# Patient Record
Sex: Female | Born: 1975
Health system: Southern US, Community
[De-identification: ages and names within clinical notes are randomized; demographics above are authoritative.]

## PROBLEM LIST (undated history)

## (undated) DIAGNOSIS — I10 Essential (primary) hypertension: Secondary | ICD-10-CM

## (undated) DIAGNOSIS — E669 Obesity, unspecified: Secondary | ICD-10-CM

## (undated) DIAGNOSIS — F32A Depression, unspecified: Secondary | ICD-10-CM

## (undated) DIAGNOSIS — F329 Major depressive disorder, single episode, unspecified: Secondary | ICD-10-CM

## (undated) DIAGNOSIS — Z6841 Body Mass Index (BMI) 40.0 and over, adult: Secondary | ICD-10-CM

## (undated) DIAGNOSIS — G4730 Sleep apnea, unspecified: Secondary | ICD-10-CM

---

## 2014-05-22 ENCOUNTER — Emergency Department (HOSPITAL_COMMUNITY)
Admission: EM | Admit: 2014-05-22 | Discharge: 2014-05-22 | Discharge status: Against Medical Advice | Payer: Self-pay | Attending: Emergency Medicine | Admitting: Emergency Medicine

## 2014-05-22 ENCOUNTER — Encounter (HOSPITAL_COMMUNITY): Payer: Self-pay | Admitting: *Deleted

## 2014-05-22 DIAGNOSIS — Z5320 Procedure and treatment not carried out because of patient's decision for unspecified reasons: Secondary | ICD-10-CM

## 2014-05-22 DIAGNOSIS — E669 Obesity, unspecified: Secondary | ICD-10-CM | POA: Insufficient documentation

## 2014-05-22 DIAGNOSIS — Z720 Tobacco use: Secondary | ICD-10-CM | POA: Insufficient documentation

## 2014-05-22 DIAGNOSIS — R109 Unspecified abdominal pain: Secondary | ICD-10-CM | POA: Insufficient documentation

## 2014-05-22 DIAGNOSIS — R110 Nausea: Secondary | ICD-10-CM | POA: Insufficient documentation

## 2014-05-22 DIAGNOSIS — Z9119 Patient's noncompliance with other medical treatment and regimen: Secondary | ICD-10-CM

## 2014-05-22 HISTORY — DX: Depression, unspecified: F32.A

## 2014-05-22 HISTORY — DX: Major depressive disorder, single episode, unspecified: F32.9

## 2014-05-22 HISTORY — DX: Obesity, unspecified: E66.9

## 2014-05-22 LAB — CBC WITH DIFFERENTIAL/PLATELET
BASOS ABS: 0 10*3/uL (ref 0.0–0.1)
Basophils Relative: 0 % (ref 0–1)
Eosinophils Absolute: 0.2 10*3/uL (ref 0.0–0.7)
Eosinophils Relative: 2 % (ref 0–5)
HCT: 37.2 % (ref 36.0–46.0)
HEMOGLOBIN: 11.8 g/dL — AB (ref 12.0–15.0)
LYMPHS PCT: 28 % (ref 12–46)
Lymphs Abs: 2.5 10*3/uL (ref 0.7–4.0)
MCH: 23 pg — AB (ref 26.0–34.0)
MCHC: 31.7 g/dL (ref 30.0–36.0)
MCV: 72.5 fL — AB (ref 78.0–100.0)
Monocytes Absolute: 0.4 10*3/uL (ref 0.1–1.0)
Monocytes Relative: 4 % (ref 3–12)
NEUTROS ABS: 6 10*3/uL (ref 1.7–7.7)
Neutrophils Relative %: 66 % (ref 43–77)
Platelets: 297 10*3/uL (ref 150–400)
RBC: 5.13 MIL/uL — ABNORMAL HIGH (ref 3.87–5.11)
RDW: 17.8 % — ABNORMAL HIGH (ref 11.5–15.5)
WBC: 9 10*3/uL (ref 4.0–10.5)

## 2014-05-22 LAB — COMPREHENSIVE METABOLIC PANEL
ALT: 13 U/L (ref 0–35)
AST: 17 U/L (ref 0–37)
Albumin: 3.1 g/dL — ABNORMAL LOW (ref 3.5–5.2)
Alkaline Phosphatase: 74 U/L (ref 39–117)
Anion gap: 7 (ref 5–15)
BUN: 12 mg/dL (ref 6–23)
CO2: 27 mmol/L (ref 19–32)
Calcium: 8.9 mg/dL (ref 8.4–10.5)
Chloride: 106 mmol/L (ref 96–112)
Creatinine, Ser: 0.79 mg/dL (ref 0.50–1.10)
GFR calc Af Amer: >90 mL/min (ref >90)
GFR calc non Af Amer: >90 mL/min (ref >90)
Glucose, Bld: 154 mg/dL — ABNORMAL HIGH (ref 70–99)
Potassium: 3.4 mmol/L — ABNORMAL LOW (ref 3.5–5.1)
Sodium: 140 mmol/L (ref 135–145)
Total Bilirubin: 0.3 mg/dL (ref 0.3–1.2)
Total Protein: 6.9 g/dL (ref 6.0–8.3)

## 2014-05-22 LAB — LIPASE, BLOOD: LIPASE: 30 U/L (ref 11–59)

## 2014-05-22 NOTE — ED Notes (Addendum)
Pt reports not feeling well x 2 days with abd pain and nausea. Pain is to right side of abd. No acute distress noted at triage.

## 2014-05-22 NOTE — ED Notes (Signed)
Pt did not answer when called x3 

## 2014-05-22 NOTE — ED Notes (Signed)
No answer x2 

## 2014-05-22 NOTE — ED Notes (Signed)
No answer from pt in waiting room 

## 2014-05-25 DIAGNOSIS — F250 Schizoaffective disorder, bipolar type: Secondary | ICD-10-CM | POA: Insufficient documentation

## 2014-05-25 DIAGNOSIS — F259 Schizoaffective disorder, unspecified: Secondary | ICD-10-CM | POA: Insufficient documentation

## 2014-05-26 NOTE — ED Provider Notes (Signed)
Pt left before being seen after triage  1. Left before treatment completed      Toy CookeyMegan Docherty, MD 05/26/14 1152

## 2014-06-06 ENCOUNTER — Ambulatory Visit (INDEPENDENT_AMBULATORY_CARE_PROVIDER_SITE_OTHER): Payer: Self-pay | Admitting: Psychiatry

## 2014-06-06 ENCOUNTER — Encounter (HOSPITAL_COMMUNITY): Payer: Self-pay | Admitting: Psychiatry

## 2014-06-06 VITALS — BP 146/104 | HR 82 | Ht 65.0 in | Wt 334.0 lb

## 2014-06-06 DIAGNOSIS — F200 Paranoid schizophrenia: Secondary | ICD-10-CM

## 2014-06-06 MED ORDER — THIOTHIXENE 10 MG PO CAPS: 10.0000 mg | ORAL_CAPSULE | Freq: Every day | ORAL | Status: DC

## 2014-06-06 MED ORDER — BENZTROPINE MESYLATE 0.5 MG PO TABS: 0.5000 mg | ORAL_TABLET | Freq: Every day | ORAL | Status: DC
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
## 2014-06-28 ENCOUNTER — Ambulatory Visit (HOSPITAL_COMMUNITY): Payer: Self-pay | Admitting: Psychiatry

## 2014-07-04 ENCOUNTER — Ambulatory Visit (HOSPITAL_COMMUNITY): Payer: Self-pay | Admitting: Psychiatry

## 2014-07-04 ENCOUNTER — Ambulatory Visit (HOSPITAL_COMMUNITY): Payer: Self-pay | Admitting: Clinical

## 2014-07-28 ENCOUNTER — Encounter (HOSPITAL_COMMUNITY): Payer: Self-pay | Admitting: Psychiatry

## 2014-07-28 ENCOUNTER — Ambulatory Visit (INDEPENDENT_AMBULATORY_CARE_PROVIDER_SITE_OTHER): Payer: 59 | Admitting: Psychiatry

## 2014-07-28 VITALS — BP 123/88 | HR 89 | Ht 65.5 in | Wt 333.2 lb

## 2014-07-28 DIAGNOSIS — F200 Paranoid schizophrenia: Secondary | ICD-10-CM | POA: Diagnosis not present

## 2014-07-28 MED ORDER — THIOTHIXENE 10 MG PO CAPS: 10.0000 mg | ORAL_CAPSULE | Freq: Every day | ORAL | Status: DC

## 2014-07-28 MED ORDER — BENZTROPINE MESYLATE 0.5 MG PO TABS: 0.5000 mg | ORAL_TABLET | Freq: Every day | ORAL | Status: DC
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
## 2014-08-01 ENCOUNTER — Ambulatory Visit (HOSPITAL_COMMUNITY): Payer: Self-pay | Admitting: Clinical

## 2014-08-29 ENCOUNTER — Encounter (HOSPITAL_COMMUNITY): Payer: Self-pay | Admitting: Psychiatry

## 2014-08-29 ENCOUNTER — Ambulatory Visit (INDEPENDENT_AMBULATORY_CARE_PROVIDER_SITE_OTHER): Payer: 59 | Admitting: Psychiatry

## 2014-08-29 VITALS — BP 160/98 | HR 88 | Ht 65.5 in | Wt 335.0 lb

## 2014-08-29 DIAGNOSIS — F200 Paranoid schizophrenia: Secondary | ICD-10-CM | POA: Diagnosis not present

## 2014-08-29 MED ORDER — THIOTHIXENE 10 MG PO CAPS: 10.0000 mg | ORAL_CAPSULE | Freq: Every day | ORAL | Status: DC

## 2014-08-29 MED ORDER — BENZTROPINE MESYLATE 0.5 MG PO TABS: 0.5000 mg | ORAL_TABLET | Freq: Every day | ORAL | Status: DC
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
## 2014-11-29 ENCOUNTER — Ambulatory Visit (HOSPITAL_COMMUNITY): Payer: Self-pay | Admitting: Psychiatry

## 2015-06-07 ENCOUNTER — Telehealth (HOSPITAL_COMMUNITY): Payer: Self-pay

## 2015-06-07 NOTE — Telephone Encounter (Signed)
Telephone call with patient after she had left a message requesting Dr. Lolly Mustache provide her with refills of Navane and Cogentin but has not been seen since 08/29/14.  Patient reported she has not been able to come in due to not having any insurance.  States she has just taken a job and will have insurance in 60 days.  Informed it was doubtful Dr. Lolly Mustache would provide her with new orders since it had been so long since her last evaluation.  Discussed options patient could try temporarily to since she presently has no insurance and would also need assistance for medications.  Patient stated plan to contact [facility] for a walk-in evaluation and will call back if problems getting scheduled.

## 2015-07-07 ENCOUNTER — Emergency Department (INDEPENDENT_AMBULATORY_CARE_PROVIDER_SITE_OTHER)
Admission: EM | Admit: 2015-07-07 | Discharge: 2015-07-07 | Disposition: A | Discharge status: Home / Self Care | Payer: Self-pay | Source: Home / Self Care | Attending: Emergency Medicine | Admitting: Emergency Medicine

## 2015-07-07 ENCOUNTER — Encounter (HOSPITAL_COMMUNITY): Payer: Self-pay

## 2015-07-07 ENCOUNTER — Other Ambulatory Visit (HOSPITAL_COMMUNITY): Payer: Self-pay | Admitting: Psychiatry

## 2015-07-07 DIAGNOSIS — Z760 Encounter for issue of repeat prescription: Secondary | ICD-10-CM

## 2015-07-07 MED ORDER — THIOTHIXENE 10 MG PO CAPS: ORAL_CAPSULE | ORAL | Status: DC

## 2015-07-07 MED ORDER — BENZTROPINE MESYLATE 0.5 MG PO TABS: 1.0000 mg | ORAL_TABLET | Freq: Two times a day (BID) | ORAL | Status: DC

## 2015-07-07 NOTE — ED Notes (Addendum)
Patient needs medication refill on Bienztropine (cogentin) 0.5mg  and throthixene (novane) 10 mg  No acute distress

## 2015-07-07 NOTE — ED Provider Notes (Signed)
CSN: [id number]     Arrival date & time 07/07/15  1841 History   First MD Initiated Contact with Patient 07/07/15 1943     Chief Complaint  Patient presents with  . Medication Refill   (Consider location/radiation/quality/duration/timing/severity/associated sxs/prior Treatment) HPI History obtained from patient:  Out of medication for 2 weeks.  Does not have a PCP No insurance No suicidal/or homicidal  Past Medical History  Diagnosis Date  . Obesity   . Depression    Past Surgical History  Procedure Laterality Date  . Cesarean section     Family History  Problem Relation Age of Onset  . Schizophrenia Mother    Social History  Substance Use Topics  . Smoking status: Current Every Day Smoker    Types: Cigarettes  . Smokeless tobacco: Never Used  . Alcohol Use: 0.0 oz/week    0 Standard drinks or equivalent per week     Comment: occasional   OB History    No data available     Review of Systems Out of medication Allergies  Penicillins  Home Medications   Prior to Admission medications   Medication Sig Start Date End Date Taking? Authorizing Provider  benztropine (COGENTIN) 0.5 MG tablet Take 2 tablets (1 mg total) by mouth 2 (two) times daily. 07/07/15   Tharon AquasFrank C Tyree Vandruff, PA  thiothixene (NAVANE) 10 MG capsule 1 PO qhs 07/07/15   Tharon AquasFrank C Cashlyn Huguley, PA   Meds Ordered and Administered this Visit  Medications - No data to display  BP 132/97 mmHg  Pulse 86  Temp(Src) 98.6 F (37 C) (Oral)  SpO2 97%  LMP 06/15/2015 (Approximate) No data found.   Physical Exam NURSES NOTES AND VITAL SIGNS REVIEWED. CONSTITUTIONAL: Well developed, well nourished, no acute distress HEENT: normocephalic, atraumatic, right and left TM's are normal EYES: Conjunctiva normal NECK:normal ROM, supple, no adenopathy PULMONARY:No respiratory distress, normal effort, Lungs: CTAb/l, no wheezes, or increased work of breathing CARDIOVASCULAR: RRR, no murmur ABDOMEN: soft, ND, NT, +'ve  BS MUSCULOSKELETAL: Normal ROM of all extremities,  SKIN: warm and dry without rash PSYCHIATRIC: Mood and affect, behavior are normal  ED Course  Procedures (including critical care time)  Labs Review Labs Reviewed - No data to display  Imaging Review No results found.   Visual Acuity Review  Right Eye Distance:   Left Eye Distance:   Bilateral Distance:    Right Eye Near:   Left Eye Near:    Bilateral Near:        RX navane cogentin MDM   1. Medication refill      Patient is reassured that there are no issues that require transfer to higher level of care at this time or additional tests. Patient is advised to continue home symptomatic treatment. Patient is advised that if there are new or worsening symptoms to attend the emergency department, contact primary care provider, or return to UC. Instructions of care provided discharged home in stable condition. Return to work/school note provided.   THIS NOTE WAS GENERATED USING A VOICE RECOGNITION SOFTWARE PROGRAM. ALL REASONABLE EFFORTS  WERE MADE TO PROOFREAD THIS DOCUMENT FOR ACCURACY.  I have verbally reviewed the discharge instructions with the patient. A printed AVS was given to the patient.  All questions were answered prior to discharge.      Tharon AquasFrank C Kelda Azad, PA 07/07/15 2035

## 2015-07-07 NOTE — Discharge Instructions (Signed)
Medicine Refill at the Emergency Department  We have refilled your medicine today, but it is best for you to get refills through your primary health care provider's office. In the future, please plan ahead so you do not need to get refills from the emergency department.  If the medicine we refilled was a maintenance medicine, you may have received only enough to get you by until you are able to see your regular health care provider.     This information is not intended to replace advice given to you by your health care provider. Make sure you discuss any questions you have with your health care provider.     Document Released: 08/02/2003 Document Revised: 05/06/2014 Document Reviewed: 07/23/2013  Elsevier Interactive Patient Education 2016 Elsevier Inc.

## 2015-08-20 ENCOUNTER — Emergency Department (HOSPITAL_COMMUNITY)
Admission: EM | Admit: 2015-08-20 | Discharge: 2015-08-20 | Disposition: A | Discharge status: Home / Self Care | Payer: Self-pay | Attending: Emergency Medicine | Admitting: Emergency Medicine

## 2015-08-20 ENCOUNTER — Encounter (HOSPITAL_COMMUNITY): Payer: Self-pay | Admitting: Emergency Medicine

## 2015-08-20 DIAGNOSIS — Z760 Encounter for issue of repeat prescription: Secondary | ICD-10-CM | POA: Insufficient documentation

## 2015-08-20 DIAGNOSIS — R51 Headache: Secondary | ICD-10-CM | POA: Insufficient documentation

## 2015-08-20 DIAGNOSIS — Z8659 Personal history of other mental and behavioral disorders: Secondary | ICD-10-CM | POA: Insufficient documentation

## 2015-08-20 DIAGNOSIS — R519 Headache, unspecified: Secondary | ICD-10-CM

## 2015-08-20 DIAGNOSIS — Z9119 Patient's noncompliance with other medical treatment and regimen: Secondary | ICD-10-CM | POA: Insufficient documentation

## 2015-08-20 DIAGNOSIS — F17210 Nicotine dependence, cigarettes, uncomplicated: Secondary | ICD-10-CM | POA: Insufficient documentation

## 2015-08-20 DIAGNOSIS — E669 Obesity, unspecified: Secondary | ICD-10-CM | POA: Insufficient documentation

## 2015-08-20 DIAGNOSIS — Z880 Allergy status to penicillin: Secondary | ICD-10-CM | POA: Insufficient documentation

## 2015-08-20 MED ORDER — THIOTHIXENE 10 MG PO CAPS: 10.0000 mg | ORAL_CAPSULE | Freq: Every day | ORAL | Status: DC

## 2015-08-20 MED ORDER — BENZTROPINE MESYLATE 1 MG PO TABS: 1.0000 mg | ORAL_TABLET | Freq: Two times a day (BID) | ORAL | Status: DC

## 2015-08-20 MED ORDER — BENZTROPINE MESYLATE 1 MG PO TABS
1.0000 mg | ORAL_TABLET | Freq: Once | ORAL | Status: AC
  Administered 2015-08-20: 1 mg via ORAL
  Filled 2015-08-20: qty 1

## 2015-08-20 MED ORDER — ACETAMINOPHEN 500 MG PO TABS
1000.0000 mg | ORAL_TABLET | Freq: Once | ORAL | Status: AC
  Administered 2015-08-20: 1000 mg via ORAL
  Filled 2015-08-20: qty 2

## 2015-08-20 NOTE — ED Notes (Signed)
Pt. reports headache for 2 days , denies head injury , no nausea or photophobia . No fever or chills.

## 2015-08-20 NOTE — ED Provider Notes (Signed)
CSN: [id number]     Arrival date & time 08/20/15  0047 History   First MD Initiated Contact with Patient 08/20/15 [phone]     Chief Complaint  Patient presents with  . Headache     (Consider location/radiation/quality/duration/timing/severity/associated sxs/prior Treatment) HPI Comments: 40 year old female with a history of paranoid schizophrenia who presents with headache. She reports a gradual onset of headache that is moderate in intensity. She has had similar headaches previously when she is not taking her medications. She reports being out of her to home medications, Cogentin and thiothixene, for the past 3-4 days. She does not currently have a PCP due to active insurance but is acquiring insurance soon through her new job. She states that this headache is like past headaches she has had related to medication noncompliance. No visual changes, fevers, neck stiffness, vomiting, abdominal pain, or recent illness. No rashes. She wants to get a medication refill as she is also concerned about developing hallucinations, which has not happened yet.  Patient is a 40 y.o. female presenting with headaches. The history is provided by the patient.  Headache   Past Medical History  Diagnosis Date  . Obesity   . Depression    Past Surgical History  Procedure Laterality Date  . Cesarean section     Family History  Problem Relation Age of Onset  . Schizophrenia Mother    Social History  Substance Use Topics  . Smoking status: Current Every Day Smoker    Types: Cigarettes  . Smokeless tobacco: Never Used  . Alcohol Use: 0.0 oz/week    0 Standard drinks or equivalent per week     Comment: occasional   OB History    No data available     Review of Systems  Neurological: Positive for headaches.   10 Systems reviewed and are negative for acute change except as noted in the HPI.   Allergies  Penicillins  Home Medications   Prior to Admission medications   Medication Sig Start Date  End Date Taking? Authorizing Provider  benztropine (COGENTIN) 1 MG tablet Take 1 tablet (1 mg total) by mouth 2 (two) times daily. 08/20/15   Laurence Spates, MD  thiothixene (NAVANE) 10 MG capsule Take 1 capsule (10 mg total) by mouth at bedtime. 08/20/15   Ambrose Finland Xiomara Sevillano, MD   BP 143/92 mmHg  Pulse 98  Temp(Src) 98.1 F (36.7 C) (Oral)  Resp 24  Ht  (1.676 m)  Wt 334 lb 3 oz (151.586 kg)  BMI 53.96 kg/m2  SpO2 97% Physical Exam  Constitutional: She is oriented to person, place, and time. She appears well-developed and well-nourished. No distress.  HENT:  Head: Normocephalic and atraumatic.  Moist mucous membranes  Eyes: Conjunctivae are normal. Pupils are equal, round, and reactive to light.  Neck: Normal range of motion. Neck supple.  Cardiovascular: Normal rate, regular rhythm and normal heart sounds.   No murmur heard. Pulmonary/Chest: Effort normal and breath sounds normal.  Abdominal: Soft. Bowel sounds are normal. She exhibits no distension. There is no tenderness.  Musculoskeletal: She exhibits no edema.  Neurological: She is alert and oriented to person, place, and time. No cranial nerve deficit. She exhibits normal muscle tone.  Fluent speech, 5/5 strength and normal sensation x all 4 ext  Skin: Skin is warm and dry.  Psychiatric: She has a normal mood and affect. Judgment normal.  Calm, avoids eye contact  Nursing note and vitals reviewed.   ED Course  Procedures (including  critical care time) Labs Review Labs Reviewed - No data to display   Medications  acetaminophen (TYLENOL) tablet 1,000 mg (not administered)  benztropine (COGENTIN) tablet 1 mg (not administered)     MDM   Final diagnoses:  Acute nonintractable headache, unspecified headache type  Medication refill    Patient presents with 2 days of headache which she has had previously with medication noncompliance; patient out of her medication for 3-4 days. On exam she was awake and  alert, comfortable. Normal neurologic exam. Vital signs notable for mild hypertension. She had not taken any medications at home for her headache. She specifically requested refills. Gave the patient Tylenol as well as her dose of Cogentin. Because of patient's desire to remain compliant with her psychiatric medications, I did provide her with a short refill of both of her medications to allow her time to schedule an appointment with a PCP. I emphasized the importance of establishing care and provided with outpatient resources for clinics. I reviewed return precautions regarding her headache including fever, vomiting, visual changes, or other neurologic symptoms. Patient voiced understanding and was discharged in satisfactory condition.  Laurence Spatesachel Morgan Johndavid Geralds, MD 08/20/15 [phone]

## 2015-08-20 NOTE — Discharge Instructions (Signed)
General Headache Without Cause A headache is pain or discomfort felt around the head or neck area. The specific cause of a headache may not be found. There are many causes and types of headaches. A few common ones are:  Tension headaches.  Migraine headaches.  Cluster headaches.  Chronic daily headaches. HOME CARE INSTRUCTIONS  Watch your condition for any changes. Take these steps to help with your condition: Managing Pain  Take over-the-counter and prescription medicines only as told by your health care provider.  Lie down in a dark, quiet room when you have a headache.  If directed, apply ice to the head and neck area:  Put ice in a plastic bag.  Place a towel between your skin and the bag.  Leave the ice on for 20 minutes, 2-3 times per day.  Use a heating pad or hot shower to apply heat to the head and neck area as told by your health care provider.  Keep lights dim if bright lights bother you or make your headaches worse. Eating and Drinking  Eat meals on a regular schedule.  Limit alcohol use.  Decrease the amount of caffeine you drink, or stop drinking caffeine. General Instructions  Keep all follow-up visits as told by your health care provider. This is important.  Keep a headache journal to help find out what may trigger your headaches. For example, write down:  What you eat and drink.  How much sleep you get.  Any change to your diet or medicines.  Try massage or other relaxation techniques.  Limit stress.  Sit up straight, and do not tense your muscles.  Do not use tobacco products, including cigarettes, chewing tobacco, or e-cigarettes. If you need help quitting, ask your health care provider.  Exercise regularly as told by your health care provider.  Sleep on a regular schedule. Get 7-9 hours of sleep, or the amount recommended by your health care provider. SEEK MEDICAL CARE IF:   Your symptoms are not helped by medicine.  You have a  headache that is different from the usual headache.  You have nausea or you vomit.  You have a fever. SEEK IMMEDIATE MEDICAL CARE IF:   Your headache becomes severe.  You have repeated vomiting.  You have a stiff neck.  You have a loss of vision.  You have problems with speech.  You have pain in the eye or ear.  You have muscular weakness or loss of muscle control.  You lose your balance or have trouble walking.  You feel faint or pass out.  You have confusion.   This information is not intended to replace advice given to you by your health care provider. Make sure you discuss any questions you have with your health care provider.   Document Released: 04/15/2005 Document Revised: 01/04/2015 Document Reviewed: 08/08/2014 Elsevier Interactive Patient Education 2016 [employer].  [employer] Assistance The United Ways 211 is a great source of information about community services available.  Access by dialing 2-1-1 from anywhere in [place], or by website -  PooledIncome.pl.   Other Local Resources (Updated 04/2015)  Financial Assistance   Services    Phone Number and Address  [facility]  Low-cost medical care - 1st and 3rd Saturday of every month  Must not qualify for public or private insurance and must have limited income [phone] [address] [place], [place]    [employer] of Social Services  Child care  Emergency assistance  for housing and [employer]  Medicaid [phone] [address] [place], [place] [address]   [facility] Department  Low-cost medical care for children, communicable diseases, sexually-transmitted diseases, immunizations, maternity care, womens health and family planning [phone] [address] [place], [place] [address]  [facility]   Medication assistance for  [facility] residents  Must meet income requirements [phone] [address] [place], [place].    [facility] Social Services  Child care  Emergency assistance for housing and [employer]  Medicaid [id number] [address] [place], [place] [address]  [facility]   Low-cost medical care,   Monday through Friday, 9 am to 6 pm.   Accepts Medicare/Medicaid, and self-pay [phone] [address] [place], [place] [address]  [facility]  Low-cost medical care - Monday through Friday, 8:30 am - 5:30 pm  Accepts Medicaid and self-pay [phone] [address] [place], [place] [address]   [facility]  Primary medical care, including for those with sickle cell disease  Accepts Medicare, Medicaid, insurance and self-pay [phone] [address] [place], [place]  [facility]   Primary medical care  Accepts Medicare, [place], insurance and self-pay [phone] [address] [place], [place] [address]   [facility] Department of Social Services  Child care  Emergency assistance for housing and [employer]  Medicaid [phone] [address] [place], [place] [address]  [facility] Department of Health and [employer]  Child care  Emergency assistance for housing and [employer]  Medicaid [phone] [address] [place], [place] [address]   [facility] Medication Assistance Program  Medication assistance for [facility] residents with no insurance only  Must have a primary care doctor [phone] [place], [place]  [facility]   Primary medical care  [place], [place], insurance  [phone] [address] [place], [place]  [facility]   Medication assistance [phone]  [facility]   Primary medical care  Accepts Medicare, [place], insurance and self-pay [phone] [address] [place], [place] [address]  [facility]   Primary medical care  Accepts Medicare, [place], insurance and self-pay [phone] [address] [place], [place] [address]  [facility]  For [place] residents between the ages of 22 and 80 who do not have any form of health insurance, Medicare, [place], or [place] benefits.  Services are provided free of charge to uninsured patients who fall within federal poverty guidelines.    Hours: Tuesdays and Thursdays, 4:15 - 8 pm [phone] [address] [place], [place] [address]  [facility]     Primary medical care  Dental care  Nutritional counseling  Pharmacy  Accepts Medicaid, Medicare, most insurance.  Fees are adjusted based on ability to pay.   [phone] [facility] [address] [place], [place]  [phone] [facility] [address] [place], [place]  [phone] [facility] [place], [place]  [phone] [facility], [address] [place], [place]  [phone] [facility] [address] [place], [place]  [facility]  Womens health and family planning [phone] [place], [place]  [facility] Department of Social Services  Child care  Emergency assistance  for housing and [employer]ark[employer]ilities  Food stamps  Medicaid [phone] [address], [place], [place] [id number]   [facility]    Ages 918 and older  Hours: Mondays and Thursdays, 7:00 am - 9:00 am Patients are seen on a first come, first served basis. [phone], ext. [phone] [place], [place]  [facility] of Social Services  Child care  Emergency assistance for housing and [employer]  Food  stamps  Medicaid [phone] [address] [place], [place] [id number]  [facility] [employer] [facility]  Medication assistance  Rental assistance  Food pantry  Medication assistance  Housing assistance  Emergency food distribution  Utility assistance [phone] [address] [place], [place]  [phone]  [address] [place], [place] [id number] Hours: Tuesdays and Thursdays from 9am - 12 noon by appointment only  [phone] [address] [place], [place] [id number]  [facility] - Lanae Boastlara F. Gunn   Accepts private insurance, [place], and [place].  Payment is based on a sliding scale for those without insurance.  Hours: Mondays, Tuesdays and Thursdays, 8:30 am - 5:30 pm.   [phone] [address] [place], [place]  [facility] - [facility] Medicine at [facility]    Accepts private insurance, [place], and [place].  Payment is based on a sliding scale for those without insurance. [phone] [address] [place], [place]  [facility] - Pediatrics at [address]. Scientist, research (physical sciences)[place]  Accepts private insurance, [employer]icare, and [place]icaid.  Payment is based on a sliding scale for those without insurance [phone] [address], [place], [place]  [facility] - [facility]s at [place]  Accepts private insurance, [place]icare, and [place].  Payment is based on a sliding scale for those without insurance. [phone] [address] [place], [place]  [facility] - [facility]s at [facility]  Accepts private insurance, [place]are, and [place]icaid.  Payment is based on a sliding scale for those without insurance. [phone], ext. [phone] [place], [place].    [facility]  Maternity care.  Accepts Medicaid and self-pay. [phone] [address] [place], [place]

## 2017-06-05 ENCOUNTER — Other Ambulatory Visit: Payer: Self-pay

## 2017-06-05 ENCOUNTER — Emergency Department (HOSPITAL_BASED_OUTPATIENT_CLINIC_OR_DEPARTMENT_OTHER): Payer: Self-pay

## 2017-06-05 ENCOUNTER — Encounter (HOSPITAL_BASED_OUTPATIENT_CLINIC_OR_DEPARTMENT_OTHER): Payer: Self-pay | Admitting: Emergency Medicine

## 2017-06-05 ENCOUNTER — Emergency Department (HOSPITAL_BASED_OUTPATIENT_CLINIC_OR_DEPARTMENT_OTHER)
Admission: EM | Admit: 2017-06-05 | Discharge: 2017-06-05 | Disposition: A | Discharge status: Home / Self Care | Payer: Self-pay | Attending: Emergency Medicine | Admitting: Emergency Medicine

## 2017-06-05 DIAGNOSIS — B349 Viral infection, unspecified: Secondary | ICD-10-CM | POA: Insufficient documentation

## 2017-06-05 DIAGNOSIS — R0600 Dyspnea, unspecified: Secondary | ICD-10-CM | POA: Insufficient documentation

## 2017-06-05 DIAGNOSIS — J069 Acute upper respiratory infection, unspecified: Secondary | ICD-10-CM | POA: Insufficient documentation

## 2017-06-05 DIAGNOSIS — I10 Essential (primary) hypertension: Secondary | ICD-10-CM | POA: Insufficient documentation

## 2017-06-05 DIAGNOSIS — B9789 Other viral agents as the cause of diseases classified elsewhere: Secondary | ICD-10-CM

## 2017-06-05 DIAGNOSIS — F17210 Nicotine dependence, cigarettes, uncomplicated: Secondary | ICD-10-CM | POA: Insufficient documentation

## 2017-06-05 HISTORY — DX: Essential (primary) hypertension: I10

## 2017-06-05 MED ORDER — AZITHROMYCIN 250 MG PO TABS: 250.0000 mg | ORAL_TABLET | Freq: Every day | ORAL | 0 refills | Status: DC

## 2017-06-05 MED ORDER — PREDNISONE 10 MG PO TABS: 30.0000 mg | ORAL_TABLET | Freq: Every day | ORAL | 0 refills | Status: AC

## 2017-06-05 MED ORDER — ALBUTEROL SULFATE HFA 108 (90 BASE) MCG/ACT IN AERS
2.0000 | INHALATION_SPRAY | Freq: Once | RESPIRATORY_TRACT | Status: AC
  Administered 2017-06-05: 2 via RESPIRATORY_TRACT
  Filled 2017-06-05 (×2): qty 6.7

## 2017-06-05 MED ORDER — ALBUTEROL SULFATE (2.5 MG/3ML) 0.083% IN NEBU
2.5000 mg | INHALATION_SOLUTION | Freq: Once | RESPIRATORY_TRACT | Status: AC
  Administered 2017-06-05: 2.5 mg via RESPIRATORY_TRACT
  Filled 2017-06-05: qty 3

## 2017-06-05 MED FILL — AZITHROMYCIN 250 MG TABLET: 250 | 5 days supply | Qty: 6 | Fill #0

## 2017-06-05 MED FILL — predniSONE 10 MG TABS: 10 | 5 days supply | Qty: 15 | Fill #0

## 2017-06-05 NOTE — ED Provider Notes (Signed)
Emergency Department Provider Note   I have reviewed the triage vital signs and the nursing notes.   HISTORY  Chief Complaint Cough   HPI Breanna Moore is a 42 y.o. female with PMH of HTN, tobacco use, and elevated BMI is to the emergency department with 1 week of dyspnea on exertion with slightly productive cough and fatigue.  Patient states initially she had a lot of nasal congestion was treating with over-the-counter medications.  She went to urgent care 5 days ago and was told she did not have pneumonia.  She was discharged home with an albuterol inhaler along with Tessalon Perles.  She is continue taking over-the-counter medications but states her shortness of breath and cough are not improved.  She denies fevers but has been very fatigued.  No body aches.  No headaches.  No nausea or diarrhea.  She will occasionally have episodes of posttussive emesis.  Denies hemoptysis.  Has cut back to smoking half pack a day.   Today, she was found to be very SOB by a co-worker who called EMS. She was given a breathing treatment on scene but then opted to transport herself to the ED.    Past Medical History:  Diagnosis Date  . Depression   . Depression   . Hypertension   . Obesity     Patient Active Problem List   Diagnosis Date Noted  . Schizo-affective psychosis (HCC) 05/25/2014    Past Surgical History:  Procedure Laterality Date  . CESAREAN SECTION      Current Outpatient Rx  . Order #: [id number] Class: Historical Med  . Order #: [id number] Class: Historical Med  . Order #: [id number] Class: Print  . Order #: [id number] Class: Print  . Order #: [id number] Class: Print  . Order #: [id number] Class: Print    Allergies Penicillins  Family History  Problem Relation Age of Onset  . Schizophrenia Mother     Social History Social History   Tobacco Use  . Smoking status: Current Every Day Smoker    Types: Cigarettes  . Smokeless tobacco: Never Used  Substance Use Topics  .  Alcohol use: Yes    Alcohol/week: 0.0 oz    Comment: occasional  . Drug use: No    Review of Systems  Constitutional: No fever/chills. Positive generalized fatigue.  Eyes: No visual changes. ENT: No sore throat. Cardiovascular: Denies chest pain. Respiratory: Positive shortness of breath and productive cough.  Gastrointestinal: No abdominal pain.  No nausea, no vomiting.  No diarrhea.  No constipation. Genitourinary: Negative for dysuria. Musculoskeletal: Negative for back pain. Skin: Negative for rash. Neurological: Negative for headaches, focal weakness or numbness.  10-point ROS otherwise negative.  ____________________________________________   PHYSICAL EXAM:  VITAL SIGNS: ED Triage Vitals  Enc Vitals Group     BP --      Pulse Rate 06/05/17 0931 (!) 103     Resp 06/05/17 0931 (!) 26     Temp 06/05/17 0931 98.1 F (36.7 C)     Temp Source 06/05/17 0931 Oral     SpO2 06/05/17 0931 96 %     Weight 06/05/17 0933 (!) 388 lb (176 kg)     Height 06/05/17 0933 5\' 6"  (1.676 m)    Constitutional: Alert and oriented. Well appearing and in no acute distress. Speaking in full sentences and able to provide a complete history.  Eyes: Conjunctivae are normal.  Head: Atraumatic. Nose: No congestion/rhinnorhea. Mouth/Throat: Mucous membranes are moist.  Neck: No stridor.   Cardiovascular: Tachycardia. Good  peripheral circulation. Grossly normal heart sounds.   Respiratory: Slight increased respiratory effort.  No retractions. Lungs with faint bilateral end-expiratory wheezing.  Gastrointestinal: Soft and nontender. No distention.  Musculoskeletal: No lower extremity tenderness nor edema. No gross deformities of extremities. Neurologic:  Normal speech and language. No gross focal neurologic deficits are appreciated.  Skin:  Skin is warm, dry and intact. No rash noted.  ____________________________________________  RADIOLOGY  Dg Chest 2 View  Result Date:  06/05/2017 CLINICAL DATA:  Cough and congestion for 1 week EXAM: CHEST  2 VIEW COMPARISON:  None. FINDINGS: The heart size and mediastinal contours are within normal limits. Both lungs are clear. The visualized skeletal structures are unremarkable. IMPRESSION: No active cardiopulmonary disease. Electronically Signed   By: Alcide Clever M.D.   On: 06/05/2017 10:03    ____________________________________________   PROCEDURES  Procedure(s) performed:   Procedures  None ____________________________________________   INITIAL IMPRESSION / ASSESSMENT AND PLAN / ED COURSE  Pertinent labs & imaging results that were available during my care of the patient were reviewed by me and considered in my medical decision making (see chart for details).  Patient presents to the emergency department for evaluation of exertional dyspnea with productive cough in the setting of URI symptoms.  The patient has no hypoxemia here but does have some faint bilateral end expiratory wheezing.  Lung sounds are distant given patient's body habitus but no focal areas of consolidation are appreciated.  Given continued symptoms plan for repeat chest x-ray and albuterol nebulizer.  Patient is not having chest pain, hemoptysis, or other symptoms that would require additional blood work or imaging.  Plan to reassess after breathing treatment and chest x-ray.   I did spend time counseling the patient regarding need for smoking cessation and that her current symptoms are likely made worse by her continued smoking.   10:45 AM She is feeling improved after albuterol nebulizer. Reviewed CXR with no PNA. Plan for Z-Pak, steroid, albuterol inhaler at discharge.  Provided contact information for primary care physician.  Discussed return precautions.   At this time, I do not feel there is any life-threatening condition present. I have reviewed and discussed all results (EKG, imaging, lab, urine as appropriate), exam findings with  patient. I have reviewed nursing notes and appropriate previous records.  I feel the patient is safe to be discharged home without further emergent workup. Discussed usual and customary return precautions. Patient and family (if present) verbalize understanding and are comfortable with this plan.  Patient will follow-up with their primary care provider. If they do not have a primary care provider, information for follow-up has been provided to them. All questions have been answered.  ____________________________________________  FINAL CLINICAL IMPRESSION(S) / ED DIAGNOSES  Final diagnoses:  Viral URI with cough     MEDICATIONS GIVEN DURING THIS VISIT:  Medications  albuterol (PROVENTIL HFA;VENTOLIN HFA) 108 (90 Base) MCG/ACT inhaler 2 puff (not administered)  albuterol (PROVENTIL) (2.5 MG/3ML) 0.083% nebulizer solution 2.5 mg (2.5 mg Nebulization Given 06/05/17 1018)     NEW OUTPATIENT MEDICATIONS STARTED DURING THIS VISIT:  New Prescriptions   AZITHROMYCIN (ZITHROMAX) 250 MG TABLET    Take 1 tablet (250 mg total) by mouth daily. Take first 2 tablets together, then 1 every day until finished.   PREDNISONE (DELTASONE) 10 MG TABLET    Take 3 tablets (30 mg total) by mouth daily for 5 days.    Note:  This document was prepared using Conservation officer, historic buildings and may  include unintentional dictation errors.  Alona Bene, MD Emergency Medicine    Leeya Rusconi, Arlyss Repress, MD 06/05/17 [phone]

## 2017-06-05 NOTE — Discharge Instructions (Signed)

## 2017-06-05 NOTE — ED Triage Notes (Signed)
Patient was seen and treated on Sunday at the Urgent Care and given several therapies at home. The patient has started an inhaler, and OTC medication. Patient states that she is not any better at this time.

## 2017-06-05 NOTE — ED Notes (Signed)
apteint is falling asleep during d/c the patient states that it is because she has not slept in a few days

## 2017-06-30 ENCOUNTER — Other Ambulatory Visit: Payer: Self-pay

## 2017-06-30 ENCOUNTER — Emergency Department (HOSPITAL_BASED_OUTPATIENT_CLINIC_OR_DEPARTMENT_OTHER): Payer: Medicaid Other

## 2017-06-30 ENCOUNTER — Encounter (HOSPITAL_BASED_OUTPATIENT_CLINIC_OR_DEPARTMENT_OTHER): Payer: Self-pay | Admitting: Emergency Medicine

## 2017-06-30 ENCOUNTER — Emergency Department (HOSPITAL_BASED_OUTPATIENT_CLINIC_OR_DEPARTMENT_OTHER)
Admission: EM | Admit: 2017-06-30 | Discharge: 2017-06-30 | Disposition: A | Discharge status: Home / Self Care | Payer: Medicaid Other | Attending: Emergency Medicine | Admitting: Emergency Medicine

## 2017-06-30 DIAGNOSIS — Z79899 Other long term (current) drug therapy: Secondary | ICD-10-CM | POA: Insufficient documentation

## 2017-06-30 DIAGNOSIS — J189 Pneumonia, unspecified organism: Secondary | ICD-10-CM

## 2017-06-30 DIAGNOSIS — I10 Essential (primary) hypertension: Secondary | ICD-10-CM | POA: Insufficient documentation

## 2017-06-30 DIAGNOSIS — J181 Lobar pneumonia, unspecified organism: Secondary | ICD-10-CM | POA: Insufficient documentation

## 2017-06-30 DIAGNOSIS — F17210 Nicotine dependence, cigarettes, uncomplicated: Secondary | ICD-10-CM | POA: Insufficient documentation

## 2017-06-30 LAB — CBC WITH DIFFERENTIAL/PLATELET
BASOS ABS: 0 10*3/uL (ref 0.0–0.1)
Basophils Relative: 0 %
EOS PCT: 2 %
Eosinophils Absolute: 0.2 10*3/uL (ref 0.0–0.7)
HEMATOCRIT: 35.3 % — AB (ref 36.0–46.0)
Hemoglobin: 10.6 g/dL — ABNORMAL LOW (ref 12.0–15.0)
LYMPHS ABS: 2.5 10*3/uL (ref 0.7–4.0)
LYMPHS PCT: 27 %
MCH: 21.6 pg — ABNORMAL LOW (ref 26.0–34.0)
MCHC: 30 g/dL (ref 30.0–36.0)
MCV: 71.9 fL — AB (ref 78.0–100.0)
MONOS PCT: 4 %
Monocytes Absolute: 0.4 10*3/uL (ref 0.1–1.0)
NEUTROS PCT: 67 %
Neutro Abs: 6.3 10*3/uL (ref 1.7–7.7)
Platelets: 436 10*3/uL — ABNORMAL HIGH (ref 150–400)
RBC: 4.91 MIL/uL (ref 3.87–5.11)
RDW: 21 % — AB (ref 11.5–15.5)
WBC: 9.4 10*3/uL (ref 4.0–10.5)

## 2017-06-30 LAB — BASIC METABOLIC PANEL
ANION GAP: 10 (ref 5–15)
BUN: 10 mg/dL (ref 6–20)
CALCIUM: 8.6 mg/dL — AB (ref 8.9–10.3)
CO2: 26 mmol/L (ref 22–32)
Chloride: 103 mmol/L (ref 101–111)
Creatinine, Ser: 0.82 mg/dL (ref 0.44–1.00)
Glucose, Bld: 111 mg/dL — ABNORMAL HIGH (ref 65–99)
POTASSIUM: 3.6 mmol/L (ref 3.5–5.1)
Sodium: 139 mmol/L (ref 135–145)

## 2017-06-30 LAB — BRAIN NATRIURETIC PEPTIDE: B Natriuretic Peptide: 39.3 pg/mL (ref 0.0–100.0)

## 2017-06-30 LAB — TROPONIN I

## 2017-06-30 MED ORDER — PREDNISONE 50 MG PO TABS
60.0000 mg | ORAL_TABLET | Freq: Once | ORAL | Status: AC
  Administered 2017-06-30: 60 mg via ORAL
  Filled 2017-06-30: qty 1

## 2017-06-30 MED ORDER — DOXYCYCLINE HYCLATE 100 MG PO CAPS: 100.0000 mg | ORAL_CAPSULE | Freq: Two times a day (BID) | ORAL | 0 refills | Status: AC

## 2017-06-30 MED ORDER — DOXYCYCLINE HYCLATE 100 MG PO TABS
100.0000 mg | ORAL_TABLET | Freq: Once | ORAL | Status: AC
  Administered 2017-06-30: 100 mg via ORAL
  Filled 2017-06-30: qty 1

## 2017-06-30 MED ORDER — ALBUTEROL SULFATE HFA 108 (90 BASE) MCG/ACT IN AERS: 1.0000 | INHALATION_SPRAY | Freq: Once | RESPIRATORY_TRACT | Status: DC

## 2017-06-30 MED ORDER — IPRATROPIUM-ALBUTEROL 0.5-2.5 (3) MG/3ML IN SOLN
3.0000 mL | Freq: Four times a day (QID) | RESPIRATORY_TRACT | Status: DC
  Administered 2017-06-30: 3 mL via RESPIRATORY_TRACT
  Filled 2017-06-30: qty 3

## 2017-06-30 MED ORDER — ALBUTEROL SULFATE HFA 108 (90 BASE) MCG/ACT IN AERS: 1.0000 | INHALATION_SPRAY | Freq: Four times a day (QID) | RESPIRATORY_TRACT | 0 refills | Status: DC | PRN

## 2017-06-30 MED FILL — DOXYCYCLINE HYCLATE 100 MG: 100 | 7 days supply | Qty: 14 | Fill #0

## 2017-06-30 MED FILL — PROVENTIL HFA 108 (90 BASE): 108 (90 BAS | 25 days supply | Qty: 7 | Fill #0

## 2017-06-30 NOTE — ED Provider Notes (Signed)
[facility] EMERGENCY DEPARTMENT Provider Note   CSN: [id number] Arrival date & time: 06/30/17  1208     History   Chief Complaint Chief Complaint  Patient presents with  . Shortness of Breath    HPI Breanna Moore is a 42 y.o. female this history of tobacco use is here for evaluation of persistent cough for 2-3 weeks mildly productive with a couple of bloody streaks. Developed shortness of breath described as "feeling winded" with walking from her car to her work and to the bathroom. Feels like her chest is tight. Denies fevers, chills, exertional chest pain, dizziness, palpitations, nausea, vomiting, diaphoresis, lower extremity swelling, orthopnea, calf pain or swelling. States she was in the emergency department 2-3 weeks ago and was diagnosed with a possible early pneumonia and was treated with azithromycin and albuterol. Felt like her symptoms were improving but then over the last couple of days the cough has returned. States she has not been able to smoke cigarettes in the last 2-3 weeks due to the cough. No history of DVT/PE, recent immobilization or surgeries, estrogen use, prolonged travel. Has tried Mucinex and resting which have not helped, taking a long hot shower and standing in the steam and feels like it opens up her airways. No history of asthma or COPD.  HPI  Past Medical History:  Diagnosis Date  . Depression   . Depression   . Hypertension   . Obesity     Patient Active Problem List   Diagnosis Date Noted  . Schizo-affective psychosis (HCC) 05/25/2014    Past Surgical History:  Procedure Laterality Date  . CESAREAN SECTION      OB History    No data available       Home Medications    Prior to Admission medications   Medication Sig Start Date End Date Taking? Authorizing Provider  VALSARTAN PO Take by mouth.   Yes [provider]  albuterol (PROVENTIL HFA;VENTOLIN HFA) 108 (90 Base) MCG/ACT inhaler Inhale 1-2 puffs into the lungs  every 6 (six) hours as needed for wheezing or shortness of breath. 06/30/17   Liberty Handy, PA-C  azithromycin (ZITHROMAX) 250 MG tablet Take 1 tablet (250 mg total) by mouth daily. Take first 2 tablets together, then 1 every day until finished. 06/05/17   Long, Arlyss Repress, MD  benztropine (COGENTIN) 1 MG tablet Take 1 tablet (1 mg total) by mouth 2 (two) times daily. 08/20/15   Little, Ambrose Finland, MD  buPROPion (WELLBUTRIN XL) 150 MG 24 hr tablet Take 150 mg by mouth daily.    [provider]  doxycycline (VIBRAMYCIN) 100 MG capsule Take 1 capsule (100 mg total) by mouth 2 (two) times daily for 7 days. 06/30/17 07/07/17  Liberty Handy, PA-C  Lurasidone HCl (LATUDA) 60 MG TABS Take by mouth.    [provider]  thiothixene (NAVANE) 10 MG capsule Take 1 capsule (10 mg total) by mouth at bedtime. 08/20/15   Little, Ambrose Finland, MD    Family History Family History  Problem Relation Age of Onset  . Schizophrenia Mother     Social History Social History   Tobacco Use  . Smoking status: Current Every Day Smoker    Types: Cigarettes  . Smokeless tobacco: Never Used  Substance Use Topics  . Alcohol use: Yes    Alcohol/week: 0.0 oz    Comment: occasional  . Drug use: No     Allergies   Penicillins   Review of Systems Review  of Systems  Respiratory: Positive for cough, chest tightness and shortness of breath.   All other systems reviewed and are negative.    Physical Exam Updated Vital Signs BP (!) 144/74   Pulse 88   Temp 97.6 F (36.4 C) (Oral)   Resp (!) 23   Ht 5\' 6"  (1.676 m)   Wt (!) 176 kg (388 lb)   SpO2 92%   BMI 62.62 kg/m   Physical Exam  Constitutional: She is oriented to person, place, and time. She appears well-developed and well-nourished. No distress.  HENT:  Head: Normocephalic and atraumatic.  Nose: Nose normal.  Mouth/Throat: Oropharynx is clear and moist. No oropharyngeal exudate.  Eyes: Conjunctivae and EOM are normal.  Pupils are equal, round, and reactive to light.  Neck: Normal range of motion. Neck supple.  Cardiovascular: Normal rate, regular rhythm, normal heart sounds and intact distal pulses.  No murmur heard. 2+ DP and radial pulses bilaterally. No LE edema or calf tenderness.   Pulmonary/Chest: Effort normal. She has wheezes in the right middle field, the right lower field, the left middle field and the left lower field. She exhibits no tenderness.  Ambulated with normal Spo2 with ambulation   Abdominal: Soft. Bowel sounds are normal. She exhibits no distension. There is no tenderness.  Musculoskeletal: Normal range of motion. She exhibits no deformity.  Lymphadenopathy:    She has no cervical adenopathy.  Neurological: She is alert and oriented to person, place, and time. No sensory deficit.  Skin: Skin is warm and dry. Capillary refill takes less than 2 seconds.  Psychiatric: She has a normal mood and affect. Her behavior is normal. Judgment and thought content normal.  Nursing note and vitals reviewed.    ED Treatments / Results  Labs (all labs ordered are listed, but only abnormal results are displayed) Labs Reviewed  CBC WITH DIFFERENTIAL/PLATELET - Abnormal; Notable for the following components:      Result Value   Hemoglobin 10.6 (*)    HCT 35.3 (*)    MCV 71.9 (*)    MCH 21.6 (*)    RDW 21.0 (*)    Platelets 436 (*)    All other components within normal limits  BASIC METABOLIC PANEL - Abnormal; Notable for the following components:   Glucose, Bld 111 (*)    Calcium 8.6 (*)    All other components within normal limits  BRAIN NATRIURETIC PEPTIDE  TROPONIN I    EKG  EKG Interpretation  Date/Time:  Monday June 30 2017 12:20:33 EST Ventricular Rate:  91 PR Interval:    QRS Duration: 86 QT Interval:  371 QTC Calculation: 457 R Axis:   41 Text Interpretation:  Sinus rhythm Borderline prolonged PR interval Low voltage, precordial leads No STEMI.  Confirmed by Alona Bene  [phone]) on 06/30/2017 12:28:15 PM       Radiology Dg Chest 2 View  Result Date: 06/30/2017 CLINICAL DATA:  Patient underwent treatment for mid pneumonia several weeks ago. The patient now complains of exertional shortness of breath. History of obesity, hypertension, current smoker. EXAM: CHEST  2 VIEW COMPARISON:  Chest x-ray of June 05, 2017 FINDINGS: The lungs are adequately inflated. There is increased density in the right lower lobe. There is no pleural effusion or pneumothorax. The heart is top-normal in size. There is prominence of the central pulmonary vascularity. The bony thorax exhibits no acute abnormality. IMPRESSION: Low-grade CHF. Probable right basilar atelectasis or pneumonia. Followup PA and lateral chest X-ray is recommended in  3-4 weeks following trial of antibiotic therapy to ensure resolution and exclude underlying malignancy. Electronically Signed   By: David  Swazil[place]Jordan M.D.   On: 06/30/2017 13:10    Procedures Procedures (including critical care time)  Medications Ordered in ED Medications  ipratropium-albuterol (DUONEB) 0.5-2.5 (3) MG/3ML nebulizer solution 3 mL (3 mLs Nebulization Given 06/30/17 1324)  albuterol (PROVENTIL HFA;VENTOLIN HFA) 108 (90 Base) MCG/ACT inhaler 1-2 puff (not administered)  predniSONE (DELTASONE) tablet 60 mg (60 mg Oral Given 06/30/17 1311)  doxycycline (VIBRA-TABS) tablet 100 mg (100 mg Oral Given 06/30/17 1503)     Initial Impression / Assessment and Plan / ED Course  I have reviewed the triage vital signs and the nursing notes.  Pertinent labs & imaging results that were available during my care of the patient were reviewed by me and considered in my medical decision making (see chart for details).  Clinical Course as of Jun 30 1524  Mon Jun 30, 2017  1417 Hemoglobin: (!) 10.6 [CG]    Clinical Course User Index [CG] Liberty HandyGibbons, Kenzel Ruesch J, PA-C   42 y.o. yo female cough, dyspnea, and sputum production.  On physical examination,VS are within  normal limits with no fever, tachypnea or tachycardia.  Patient is non toxic appearing, speaking in full sentences with no signs of respiratory distress.  Chest examination revealed diffuse wheezing and decreased breath sounds to bilateral lower lobes although exam difficult given body habitus. CXR reveals infiltrate and mild vascular congestion.  Labs obtained and reassuring, mild anemia noted which is not new. BNP normal. She does not appear to be fluid overloaded on exam. She ambulated without hypoxia in ED. CURB-65 qualifies patient is low risk for complications with outpatient antibiotic tx for CAP and that outpatient abx is appropriate.  Will discharge patient with doxycycline and albuterol with adjunctive symptomatic treatment with close PCP f/u.  Patient verbalized understanding and is agreeable to discharge plan.  Strict ED return precautions given, patient is aware of red flag symptoms to monitor for that would warrant return to ED for further evaluation.  Vital signs were within normal limits prior to discharge. She was advised to f/u with cardiology and PCP for CXR finding of low grade CHF/vascular congestion on CXR.   Final Clinical Impressions(s) / ED Diagnoses   Final diagnoses:  Community acquired pneumonia of right lower lobe of lung [facility](HCC)    ED Discharge Orders        Ordered    doxycycline (VIBRAMYCIN) 100 MG capsule  2 times daily     06/30/17 1456    albuterol (PROVENTIL HFA;VENTOLIN HFA) 108 (90 Base) MCG/ACT inhaler  Every 6 hours PRN     06/30/17 1456       Liberty HandyGibbons, Danee Soller J, PA-C 06/30/17 1526    LongArlyss Repress, Joshua G, MD 06/30/17 2003

## 2017-06-30 NOTE — ED Notes (Signed)
Pt ambulated and stated it was a normal walk for her. Spo2 96-99 and heart rate 100-105.

## 2017-06-30 NOTE — Discharge Instructions (Signed)
As we discussed your chest x-ray should right lower lobe pneumonia and some congestion along vasculature around your heart. Take doxycyline as prescribed and albuterol inhaler. Avoid cigarettes. You need a repeat chest x-ray in 3-4 weeks and follow up with cardiology for evaluation of your heart given new chest x-ray findings.   Return for worsening symptoms, shortness of breath and chest pain with exertion or activity.

## 2017-06-30 NOTE — ED Triage Notes (Signed)
Reports treated for pneumonia several weeks ago.  States that she cannot walk more than a few steps without becoming short of breath.

## 2017-07-07 ENCOUNTER — Encounter (HOSPITAL_COMMUNITY): Payer: Self-pay | Admitting: Emergency Medicine

## 2017-07-07 ENCOUNTER — Emergency Department (HOSPITAL_COMMUNITY): Payer: Medicaid Other

## 2017-07-07 ENCOUNTER — Ambulatory Visit: Payer: Self-pay | Admitting: Cardiology

## 2017-07-07 ENCOUNTER — Emergency Department (HOSPITAL_COMMUNITY)
Admission: EM | Admit: 2017-07-07 | Discharge: 2017-07-07 | Disposition: A | Discharge status: Home / Self Care | Payer: Medicaid Other | Attending: Emergency Medicine | Admitting: Emergency Medicine

## 2017-07-07 DIAGNOSIS — Z5321 Procedure and treatment not carried out due to patient leaving prior to being seen by health care provider: Secondary | ICD-10-CM | POA: Insufficient documentation

## 2017-07-07 DIAGNOSIS — R0602 Shortness of breath: Secondary | ICD-10-CM | POA: Insufficient documentation

## 2017-07-07 NOTE — ED Triage Notes (Signed)
Pt reports that she been diagnosed with PNA twice in past 4 weeks. Still on antibiotics but having SOB with exertion.  Adds that she has back pains and will fall a sleep like she did at work today.

## 2017-08-11 ENCOUNTER — Ambulatory Visit: Payer: BLUE CROSS/BLUE SHIELD | Admitting: Cardiology

## 2017-08-11 ENCOUNTER — Encounter: Payer: Self-pay | Admitting: Cardiology

## 2017-08-11 ENCOUNTER — Other Ambulatory Visit: Payer: Self-pay | Admitting: Cardiology

## 2017-08-11 DIAGNOSIS — F17210 Nicotine dependence, cigarettes, uncomplicated: Secondary | ICD-10-CM

## 2017-08-11 DIAGNOSIS — R0602 Shortness of breath: Secondary | ICD-10-CM | POA: Diagnosis not present

## 2017-08-11 DIAGNOSIS — I10 Essential (primary) hypertension: Secondary | ICD-10-CM | POA: Diagnosis not present

## 2017-08-11 NOTE — Patient Instructions (Addendum)
Medication Instructions:  Your physician recommends that you continue on your current medications as directed. Please refer to the Current Medication list given to you today.  Labwork: None  Testing/Procedures: Your physician has requested that you have an echocardiogram. Echocardiography is a painless test that uses sound waves to create images of your heart. It provides your doctor with information about the size and shape of your heart and how well your heart's chambers and valves are working. This procedure takes approximately one hour. There are no restrictions for this procedure.  Your physician has requested that you have a lexiscan myoview. For further information please visit [email or web address]. Please follow instruction sheet, as given.  Follow-Up: Your physician recommends that you schedule a follow-up appointment in: 6 months  Any Other Special Instructions Will Be Listed Below (If Applicable).     If you need a refill on your cardiac medications before your next appointment, please call your pharmacy.   [facility]  Seydina Holliman A, RN, BSN  Echocardiogram An echocardiogram, or echocardiography, uses sound waves (ultrasound) to produce an image of your heart. The echocardiogram is simple, painless, obtained within a short period of time, and offers valuable information to your health care provider. The images from an echocardiogram can provide information such as:  Evidence of coronary artery disease (CAD).  Heart size.  Heart muscle function.  Heart valve function.  Aneurysm detection.  Evidence of a past heart attack.  Fluid buildup around the heart.  Heart muscle thickening.  Assess heart valve function.  Tell a health care provider about:  Any allergies you have.  All medicines you are taking, including vitamins, herbs, eye drops, creams, and over-the-counter medicines.  Any problems you or family members have had with anesthetic medicines.  Any  blood disorders you have.  Any surgeries you have had.  Any medical conditions you have.  Whether you are pregnant or may be pregnant. What happens before the procedure? No special preparation is needed. Eat and drink normally. What happens during the procedure?  In order to produce an image of your heart, gel will be applied to your chest and a wand-like tool (transducer) will be moved over your chest. The gel will help transmit the sound waves from the transducer. The sound waves will harmlessly bounce off your heart to allow the heart images to be captured in real-time motion. These images will then be recorded.  You may need an IV to receive a medicine that improves the quality of the pictures. What happens after the procedure? You may return to your normal schedule including diet, activities, and medicines, unless your health care provider tells you otherwise. This information is not intended to replace advice given to you by your health care provider. Make sure you discuss any questions you have with your health care provider. Document Released: 04/12/2000 Document Revised: 12/02/2015 Document Reviewed: 12/21/2012 Elsevier Interactive Patient Education  2017 [employer]  

## 2017-08-11 NOTE — Progress Notes (Signed)
Cardiology Office Note:    Date:  08/11/2017   ID:  Breanna Moore, DOB Apr 10, 1976, MRN [id number]  PCP:  Patient, No Pcp Per  Cardiologist:  Garwin Brothers, MD   Referring MD: No ref. provider found    ASSESSMENT:    1. Shortness of breath on exertion   2. Essential hypertension   3. Morbid obesity (HCC)   4. Cigarette smoker    PLAN:    In order of problems listed above:  1. Primary prevention stressed to the patient importance of compliance with diet and medications stressed and she vocalized understanding.  Her blood pressure stable.  Diet was discussed for dyslipidemia and obesity and risks of obesity explained and she vocalized understanding and she is going to work on reducing her weight with dietary intervention. 2. Cardiogram will be done to assess murmur heard on auscultation.  The patient will also be scheduled for a Lexiscan sestamibi to assess for any objective evidence of coronary artery disease in view of her shortness of breath on exertion.  She has multiple comorbidities for coronary artery disease 3. Patient will be seen in follow-up appointment in 6 months or earlier if the patient has any concerns 4. She knows to go to the nearest emergency room for any significant concerns.   Medication Adjustments/Labs and Tests Ordered: Current medicines are reviewed at length with the patient today.  Concerns regarding medicines are outlined above.  Orders Placed This Encounter  Procedures  . MYOCARDIAL PERFUSION IMAGING  . ECHOCARDIOGRAM COMPLETE   No orders of the defined types were placed in this encounter.    History of Present Illness:    Breanna Moore is a 42 y.o. female who is being seen today for the evaluation of shortness of breath.  Patient mentions to me that she is new to this area.  She is a resident of another part of the state and has moved here.  She gives history of shortness of breath on exertion.  She is morbidly obese.  She has history of  essential hypertension.  No chest pain.  She tells me that her shortness of breath is a issue affecting her quality of life.  She is an active heavy smoker she quit recently a few days ago.  No orthopnea or PND.  There is history of sleep apnea but I am not clear about the details.  She is trying to get established with a primary care physician.  She denies any history of dyslipidemia.  At the time of my evaluation, the patient is alert awake oriented and in no distress.  Past Medical History:  Diagnosis Date  . Depression   . Depression   . Hypertension   . Obesity     Past Surgical History:  Procedure Laterality Date  . CESAREAN SECTION      Current Medications: Current Meds  Medication Sig  . albuterol (PROVENTIL HFA;VENTOLIN HFA) 108 (90 Base) MCG/ACT inhaler Inhale 1-2 puffs into the lungs every 6 (six) hours as needed for wheezing or shortness of breath.  . lurasidone (LATUDA) 40 MG TABS tablet Take 40 mg by mouth daily.  Marland Kitchen VALSARTAN PO Take by mouth.  . [DISCONTINUED] azithromycin (ZITHROMAX) 250 MG tablet Take 1 tablet (250 mg total) by mouth daily. Take first 2 tablets together, then 1 every day until finished.  . [DISCONTINUED] benztropine (COGENTIN) 1 MG tablet Take 1 tablet (1 mg total) by mouth 2 (two) times daily.  . [DISCONTINUED] buPROPion (WELLBUTRIN XL) 150 MG 24  hr tablet Take 150 mg by mouth daily.  . [DISCONTINUED] Lurasidone HCl (LATUDA) 60 MG TABS Take by mouth.  . [DISCONTINUED] thiothixene (NAVANE) 10 MG capsule Take 1 capsule (10 mg total) by mouth at bedtime.     Allergies:   Hydrocodone-acetaminophen; Ketorolac; Paxil [paroxetine hcl]; and Penicillins   Social History   Socioeconomic History  . Marital status: Single    Spouse name: Not on file  . Number of children: Not on file  . Years of education: Not on file  . Highest education level: Not on file  Occupational History  . Not on file  Social Needs  . Financial resource strain: Not on file  .  Food insecurity:    Worry: Not on file    Inability: Not on file  . Transportation needs:    Medical: Not on file    Non-medical: Not on file  Tobacco Use  . Smoking status: Current Every Day Smoker    Types: Cigarettes  . Smokeless tobacco: Never Used  Substance and Sexual Activity  . Alcohol use: Yes    Alcohol/week: 0.0 oz    Comment: occasional  . Drug use: No  . Sexual activity: Never    Birth control/protection: None  Lifestyle  . Physical activity:    Days per week: Not on file    Minutes per session: Not on file  . Stress: Not on file  Relationships  . Social connections:    Talks on phone: Not on file    Gets together: Not on file    Attends religious service: Not on file    Active member of club or organization: Not on file    Attends meetings of clubs or organizations: Not on file    Relationship status: Not on file  Other Topics Concern  . Not on file  Social History Narrative  . Not on file     Family History: The patient's family history includes Schizophrenia in her mother.  ROS:   Please see the history of present illness.    All other systems reviewed and are negative.  EKGs/Labs/Other Studies Reviewed:    The following studies were reviewed today: I discussed my findings from the emergency room with the patient at length.  EKG reveals sinus rhythm with nonspecific ST-T changes and first-degree AV block.   Recent Labs: 06/30/2017: B Natriuretic Peptide 39.3; BUN 10; Creatinine, Ser 0.82; Hemoglobin 10.6; Platelets 436; Potassium 3.6; Sodium 139  Recent Lipid Panel No results found for: CHOL, TRIG, HDL, CHOLHDL, VLDL, LDLCALC, LDLDIRECT  Physical Exam:    VS:  BP 132/80 (BP Location: Left Arm, Patient Position: Sitting, Cuff Size: Normal)   Pulse 99   Ht 5\' 6"  (1.676 m)   Wt (!) 395 lb 1.9 oz (179.2 kg)   SpO2 97%   BMI 63.77 kg/m     Wt Readings from Last 3 Encounters:  08/11/17 (!) 395 lb 1.9 oz (179.2 kg)  06/30/17 (!) 388 lb (176 kg)   06/05/17 (!) 388 lb (176 kg)     GEN: Patient is in no acute distress HEENT: Normal NECK: No JVD; No carotid bruits LYMPHATICS: No lymphadenopathy CARDIAC: S1 S2 regular, 2/6 systolic murmur at the apex. RESPIRATORY:  Clear to auscultation without rales, wheezing or rhonchi  ABDOMEN: Soft, non-tender, non-distended MUSCULOSKELETAL:  No edema; No deformity  SKIN: Warm and dry NEUROLOGIC:  Alert and oriented x 3 PSYCHIATRIC:  Normal affect    Signed, Garwin Brothersajan R Becka Lagasse, MD  08/11/2017 10:40  AM    [facility]

## 2017-08-21 ENCOUNTER — Telehealth (HOSPITAL_COMMUNITY): Payer: Self-pay | Admitting: *Deleted

## 2017-08-21 NOTE — Telephone Encounter (Signed)
Left message on voicemail per DPR in reference to upcoming appointment scheduled on 08/25/17 with detailed instructions given per Myocardial Perfusion Study Information Sheet for the test. LM to arrive 15 minutes early, and that it is imperative to arrive on time for appointment to keep from having the test rescheduled. If you need to cancel or reschedule your appointment, please call the office within 24 hours of your appointment. Failure to do so may result in a cancellation of your appointment, and a $50 no show fee. Phone number given for call back for any questions. Ronnie Doo Jacqueline    

## 2017-08-25 ENCOUNTER — Ambulatory Visit (HOSPITAL_COMMUNITY): Payer: BLUE CROSS/BLUE SHIELD | Attending: Internal Medicine

## 2017-08-25 VITALS — Ht 66.0 in | Wt 395.0 lb

## 2017-08-25 DIAGNOSIS — I2510 Atherosclerotic heart disease of native coronary artery without angina pectoris: Secondary | ICD-10-CM | POA: Diagnosis not present

## 2017-08-25 DIAGNOSIS — I119 Hypertensive heart disease without heart failure: Secondary | ICD-10-CM | POA: Insufficient documentation

## 2017-08-25 DIAGNOSIS — Z6841 Body Mass Index (BMI) 40.0 and over, adult: Secondary | ICD-10-CM | POA: Insufficient documentation

## 2017-08-25 DIAGNOSIS — F17200 Nicotine dependence, unspecified, uncomplicated: Secondary | ICD-10-CM | POA: Insufficient documentation

## 2017-08-25 DIAGNOSIS — R0602 Shortness of breath: Secondary | ICD-10-CM | POA: Diagnosis not present

## 2017-08-25 MED ORDER — TECHNETIUM TC 99M TETROFOSMIN IV KIT
33.0000 | PACK | Freq: Once | INTRAVENOUS | Status: AC | PRN
  Administered 2017-08-25: 33 via INTRAVENOUS
  Filled 2017-08-25: qty 33

## 2017-08-25 MED ORDER — REGADENOSON 0.4 MG/5ML IV SOLN
0.4000 mg | Freq: Once | INTRAVENOUS | Status: AC
  Administered 2017-08-25: 0.4 mg via INTRAVENOUS

## 2017-08-26 ENCOUNTER — Ambulatory Visit (HOSPITAL_COMMUNITY): Payer: BLUE CROSS/BLUE SHIELD

## 2017-08-26 ENCOUNTER — Ambulatory Visit (HOSPITAL_BASED_OUTPATIENT_CLINIC_OR_DEPARTMENT_OTHER): Payer: BLUE CROSS/BLUE SHIELD

## 2017-08-26 DIAGNOSIS — R0602 Shortness of breath: Secondary | ICD-10-CM | POA: Diagnosis not present

## 2017-08-26 LAB — MYOCARDIAL PERFUSION IMAGING
CHL CUP NUCLEAR SDS: 8
CHL CUP NUCLEAR SRS: 0
CHL CUP NUCLEAR SSS: 8
CHL CUP RESTING HR STRESS: 90 {beats}/min
LHR: 0.43
LV dias vol: 128 mL (ref 46–106)
LV sys vol: 45 mL
NUC STRESS TID: 0.84
Peak HR: 111 {beats}/min

## 2017-08-26 MED ORDER — TECHNETIUM TC 99M TETROFOSMIN IV KIT
32.3000 | PACK | Freq: Once | INTRAVENOUS | Status: AC | PRN
  Administered 2017-08-26: 32.3 via INTRAVENOUS
  Filled 2017-08-26: qty 33

## 2017-08-26 MED ORDER — PERFLUTREN LIPID MICROSPHERE
1.0000 mL | INTRAVENOUS | Status: AC | PRN
  Administered 2017-08-26: 3 mL via INTRAVENOUS

## 2017-08-27 ENCOUNTER — Telehealth: Payer: Self-pay

## 2017-08-27 NOTE — Telephone Encounter (Signed)
Left message with patient's son to call the office to discuss echo results.

## 2017-08-28 ENCOUNTER — Telehealth: Payer: Self-pay

## 2017-08-28 NOTE — Telephone Encounter (Signed)
Left patient message to call back and talk about cardiac testing

## 2017-09-05 NOTE — Telephone Encounter (Signed)
done

## 2017-09-26 ENCOUNTER — Other Ambulatory Visit: Payer: Self-pay

## 2017-09-26 ENCOUNTER — Emergency Department (HOSPITAL_BASED_OUTPATIENT_CLINIC_OR_DEPARTMENT_OTHER)
Admission: EM | Admit: 2017-09-26 | Discharge: 2017-09-26 | Disposition: A | Discharge status: Home / Self Care | Payer: BLUE CROSS/BLUE SHIELD | Attending: Emergency Medicine | Admitting: Emergency Medicine

## 2017-09-26 ENCOUNTER — Encounter (HOSPITAL_BASED_OUTPATIENT_CLINIC_OR_DEPARTMENT_OTHER): Payer: Self-pay | Admitting: *Deleted

## 2017-09-26 DIAGNOSIS — X58XXXA Exposure to other specified factors, initial encounter: Secondary | ICD-10-CM | POA: Diagnosis not present

## 2017-09-26 DIAGNOSIS — Y999 Unspecified external cause status: Secondary | ICD-10-CM | POA: Insufficient documentation

## 2017-09-26 DIAGNOSIS — Y939 Activity, unspecified: Secondary | ICD-10-CM | POA: Insufficient documentation

## 2017-09-26 DIAGNOSIS — I10 Essential (primary) hypertension: Secondary | ICD-10-CM | POA: Diagnosis not present

## 2017-09-26 DIAGNOSIS — Y929 Unspecified place or not applicable: Secondary | ICD-10-CM | POA: Insufficient documentation

## 2017-09-26 DIAGNOSIS — F17210 Nicotine dependence, cigarettes, uncomplicated: Secondary | ICD-10-CM | POA: Diagnosis not present

## 2017-09-26 DIAGNOSIS — Z79899 Other long term (current) drug therapy: Secondary | ICD-10-CM | POA: Diagnosis not present

## 2017-09-26 DIAGNOSIS — S0501XA Injury of conjunctiva and corneal abrasion without foreign body, right eye, initial encounter: Secondary | ICD-10-CM | POA: Diagnosis not present

## 2017-09-26 MED ORDER — FLUORESCEIN SODIUM 1 MG OP STRP
1.0000 | ORAL_STRIP | Freq: Once | OPHTHALMIC | Status: AC
  Administered 2017-09-26: 1 via OPHTHALMIC
  Filled 2017-09-26: qty 1

## 2017-09-26 MED ORDER — IBUPROFEN 600 MG PO TABS: 600.0000 mg | ORAL_TABLET | Freq: Four times a day (QID) | ORAL | 0 refills | Status: DC | PRN

## 2017-09-26 MED ORDER — TETRACAINE HCL 0.5 % OP SOLN
2.0000 [drp] | Freq: Once | OPHTHALMIC | Status: AC
  Administered 2017-09-26: 2 [drp] via OPHTHALMIC
  Filled 2017-09-26: qty 4

## 2017-09-26 MED ORDER — ERYTHROMYCIN 5 MG/GM OP OINT
TOPICAL_OINTMENT | Freq: Once | OPHTHALMIC | Status: AC
  Administered 2017-09-26: 16:00:00 via OPHTHALMIC
  Filled 2017-09-26: qty 3.5

## 2017-09-26 MED ORDER — TRAMADOL HCL 50 MG PO TABS: 50.0000 mg | ORAL_TABLET | Freq: Four times a day (QID) | ORAL | 0 refills | Status: DC | PRN

## 2017-09-26 MED FILL — IBUPROFEN 600 MG TABLET: 600 | 7 days supply | Qty: 30 | Fill #0

## 2017-09-26 MED FILL — traMADol HCL 50 MG TABS: 50 | 3 days supply | Qty: 15 | Fill #0

## 2017-09-26 NOTE — ED Triage Notes (Signed)
Swelling of her right eye. Yesterday she felt like she had something in her eye. No relief with OTC visine.

## 2017-09-26 NOTE — ED Provider Notes (Signed)
[facility] EMERGENCY DEPARTMENT Provider Note   CSN: [id number] Arrival date & time: 09/26/17  1408     History   Chief Complaint Chief Complaint  Patient presents with  . Eye Problem    HPI Breanna Moore is a 42 y.o. female.  HPI Breanna Moore is a 42 y.o. female with hx of hypertension, presents to ED with complaint of right eye pain. Pt states her pain began yesterday. States at first felt like something was in her eye. She reports rubbing it and washing it out. [facility]. Reports that gradually pain has worsened, and this morning started draining purulent drainage, became photophobic, unable to open her eye. No other injuries. Does not wear contacts or glasses. No visual changes, other than unable to open the eye due to pain. No hx of same symptoms.   Past Medical History:  Diagnosis Date  . Depression   . Depression   . Hypertension   . Obesity     Patient Active Problem List   Diagnosis Date Noted  . Shortness of breath on exertion 08/11/2017  . Essential hypertension 08/11/2017  . Morbid obesity (HCC) 08/11/2017  . Cigarette smoker 08/11/2017  . Schizo-affective psychosis (HCC) 05/25/2014    Past Surgical History:  Procedure Laterality Date  . CESAREAN SECTION       OB History   None      Home Medications    Prior to Admission medications   Medication Sig Start Date End Date Taking? Authorizing Provider  lurasidone (LATUDA) 40 MG TABS tablet Take 40 mg by mouth daily.   Yes [provider]  VALSARTAN PO Take by mouth.   Yes [provider]  albuterol (PROVENTIL HFA;VENTOLIN HFA) 108 (90 Base) MCG/ACT inhaler Inhale 1-2 puffs into the lungs every 6 (six) hours as needed for wheezing or shortness of breath. 06/30/17   Liberty HandyGibbons, Claudia J, PA-C    Family History Family History  Problem Relation Age of Onset  . Schizophrenia Mother     Social History Social History   Tobacco Use  . Smoking status:  Current Every Day Smoker    Types: Cigarettes  . Smokeless tobacco: Never Used  Substance Use Topics  . Alcohol use: Yes    Alcohol/week: 0.0 oz    Comment: occasional  . Drug use: No     Allergies   Hydrocodone-acetaminophen; Ketorolac; Paxil [paroxetine hcl]; and Penicillins   Review of Systems Review of Systems  Constitutional: Negative for chills and fever.  Eyes: Positive for photophobia, pain, discharge and redness. Negative for visual disturbance.  Respiratory: Negative for cough, chest tightness and shortness of breath.   Cardiovascular: Negative for chest pain, palpitations and leg swelling.  Gastrointestinal: Negative for abdominal pain, diarrhea, nausea and vomiting.  Genitourinary: Negative for dysuria, flank pain and pelvic pain.  Musculoskeletal: Negative for arthralgias, myalgias, neck pain and neck stiffness.  Skin: Negative for rash.  Neurological: Negative for dizziness, weakness and headaches.  All other systems reviewed and are negative.    Physical Exam Updated Vital Signs BP 134/87   Pulse 99   Temp 98.2 F (36.8 C) (Oral)   Resp 18   Ht 5\' 6"  (1.676 m)   Wt (!) 179.2 kg (395 lb)   LMP 09/10/2017   SpO2 97%   BMI 63.75 kg/m   Physical Exam  Constitutional: She is oriented to person, place, and time. She appears well-developed and well-nourished. No distress.  HENT:  Head: Normocephalic.  Eyes:  Pupils are equal, round, and reactive to light. EOM and lids are normal. Lids are everted and swept, no foreign bodies found. Right conjunctiva is injected. Left conjunctiva is not injected.    Large corneal abrasion, directly over the pupil on the right eye.  Neck: Neck supple.  Cardiovascular: Normal rate, regular rhythm and normal heart sounds.  Pulmonary/Chest: Effort normal and breath sounds normal. No respiratory distress. She has no wheezes. She has no rales.  Musculoskeletal: She exhibits no edema.  Neurological: She is alert and oriented to  person, place, and time.  Skin: Skin is warm and dry.  Psychiatric: She has a normal mood and affect. Her behavior is normal.  Nursing note and vitals reviewed.    ED Treatments / Results  Labs (all labs ordered are listed, but only abnormal results are displayed) Labs Reviewed - No data to display  EKG None  Radiology No results found.  Procedures Procedures (including critical care time)  Medications Ordered in ED Medications  erythromycin ophthalmic ointment (has no administration in time range)  tetracaine (PONTOCAINE) 0.5 % ophthalmic solution 2 drop (2 drops Right Eye Given 09/26/17 1601)  fluorescein ophthalmic strip 1 strip (1 strip Right Eye Given 09/26/17 1602)     Initial Impression / Assessment and Plan / ED Course  I have reviewed the triage vital signs and the nursing notes.  Pertinent labs & imaging results that were available during my care of the patient were reviewed by me and considered in my medical decision making (see chart for details).     Patient in the emergency department with right eye irritation, pain, photophobia.  Patient has direct and consensual photophobia.  Her exam showed large corneal abrasion directly over the pupil.  Does not appear to look like an ulcer.  She does not wear contacts or glasses.  No dendritic lesions on my exam.  Will treat with antibiotic ointment to the eye, pain medications, will have a follow-up with ophthalmology in 2 days if not improving.  Patient agreeable to the plan.  Return precautions discussed  Vitals:   09/26/17 1415 09/26/17 1416  BP:  134/87  Pulse:  99  Resp:  18  Temp:  98.2 F (36.8 C)  TempSrc:  Oral  SpO2:  97%  Weight: (!) 179.2 kg (395 lb)   Height: 5\' 6"  (1.676 m)      Final Clinical Impressions(s) / ED Diagnoses   Final diagnoses:  Abrasion of right cornea, initial encounter    ED Discharge Orders        Ordered    traMADol (ULTRAM) 50 MG tablet  Every 6 hours PRN     09/26/17  1611    ibuprofen (ADVIL,MOTRIN) 600 MG tablet  Every 6 hours PRN     09/26/17 1611       Jaynie Crumble, PA-C 09/26/17 1618    LongArlyss Repress, MD 09/27/17 1055

## 2017-09-26 NOTE — Discharge Instructions (Addendum)
Take ibuprofen and tramadol for pain. Erythromycin ointment every 4 hrs. If not improving by Monday, follow up with Dr. Sherryll Burger. Return if worsening

## 2018-01-26 ENCOUNTER — Other Ambulatory Visit: Payer: Self-pay

## 2018-01-26 ENCOUNTER — Emergency Department (HOSPITAL_COMMUNITY)
Admission: EM | Admit: 2018-01-26 | Discharge: 2018-01-26 | Disposition: A | Discharge status: Home / Self Care | Payer: BLUE CROSS/BLUE SHIELD | Attending: Emergency Medicine | Admitting: Emergency Medicine

## 2018-01-26 ENCOUNTER — Emergency Department (HOSPITAL_COMMUNITY): Payer: BLUE CROSS/BLUE SHIELD

## 2018-01-26 ENCOUNTER — Encounter (HOSPITAL_COMMUNITY): Payer: Self-pay | Admitting: Emergency Medicine

## 2018-01-26 DIAGNOSIS — Z79899 Other long term (current) drug therapy: Secondary | ICD-10-CM | POA: Diagnosis not present

## 2018-01-26 DIAGNOSIS — F410 Panic disorder [episodic paroxysmal anxiety] without agoraphobia: Secondary | ICD-10-CM | POA: Insufficient documentation

## 2018-01-26 DIAGNOSIS — F17210 Nicotine dependence, cigarettes, uncomplicated: Secondary | ICD-10-CM | POA: Insufficient documentation

## 2018-01-26 DIAGNOSIS — I10 Essential (primary) hypertension: Secondary | ICD-10-CM | POA: Insufficient documentation

## 2018-01-26 LAB — CBC
HEMATOCRIT: 37.6 % (ref 36.0–46.0)
HEMOGLOBIN: 11.3 g/dL — AB (ref 12.0–15.0)
MCH: 21 pg — ABNORMAL LOW (ref 26.0–34.0)
MCHC: 30.1 g/dL (ref 30.0–36.0)
MCV: 70 fL — AB (ref 78.0–100.0)
Platelets: 469 10*3/uL — ABNORMAL HIGH (ref 150–400)
RBC: 5.37 MIL/uL — AB (ref 3.87–5.11)
RDW: 20 % — AB (ref 11.5–15.5)
WBC: 6.7 10*3/uL (ref 4.0–10.5)

## 2018-01-26 LAB — BASIC METABOLIC PANEL
ANION GAP: 10 (ref 5–15)
BUN: 12 mg/dL (ref 6–20)
CHLORIDE: 107 mmol/L (ref 98–111)
CO2: 26 mmol/L (ref 22–32)
Calcium: 9.1 mg/dL (ref 8.9–10.3)
Creatinine, Ser: 0.86 mg/dL (ref 0.44–1.00)
Glucose, Bld: 87 mg/dL (ref 70–99)
POTASSIUM: 3.7 mmol/L (ref 3.5–5.1)
Sodium: 143 mmol/L (ref 135–145)

## 2018-01-26 LAB — I-STAT TROPONIN, ED: Troponin i, poc: 0 ng/mL (ref 0.00–0.08)

## 2018-01-26 MED ORDER — HYDROXYZINE HCL 50 MG PO TABS: 50.0000 mg | ORAL_TABLET | Freq: Three times a day (TID) | ORAL | 0 refills | Status: AC

## 2018-01-26 NOTE — ED Notes (Signed)
Bed: WBH39 Expected date:  Expected time:  Means of arrival:  Comments: tr4 

## 2018-01-26 NOTE — ED Triage Notes (Signed)
Pt presents by [facility] for evaluation of panic attack. Pt currently calm and cooperative at this time.

## 2018-01-26 NOTE — ED Provider Notes (Addendum)
[facility]-EMERGENCY DEPT Provider Note  CSN: [id number] Arrival date & time: 01/26/18  1324    History   Chief Complaint Chief Complaint  Patient presents with  . Panic Attack    HPI Breanna Moore is a 42 y.o. female with a medical history of HTN who presented to the ED for panic attack. Patient reports that she and her son got into an argument which led to law enforcement being called. She states that when the police came to her house that she began to have a panic attack and endorsed chest tightness, anxiety and SOB which lasted a "few minutes." Patient states that symptoms resolved prior to coming to the ED. Patient has psychiatric history of schizoaffective disorder, but states that it has been many years since she has had issues with anxiety. Denies diaphoresis, palpitations, N/V, dizziness/lightheadedness, paresthesias or weakness during this episode. Patient states "I feel fine now."   Past Medical History:  Diagnosis Date  . Depression   . Depression   . Hypertension   . Obesity     Patient Active Problem List   Diagnosis Date Noted  . Shortness of breath on exertion 08/11/2017  . Essential hypertension 08/11/2017  . Morbid obesity (HCC) 08/11/2017  . Cigarette smoker 08/11/2017  . Schizo-affective psychosis (HCC) 05/25/2014    Past Surgical History:  Procedure Laterality Date  . CESAREAN SECTION       OB History   None      Home Medications    Prior to Admission medications   Medication Sig Start Date End Date Taking? Authorizing Provider  albuterol (PROVENTIL HFA;VENTOLIN HFA) 108 (90 Base) MCG/ACT inhaler Inhale 1-2 puffs into the lungs every 6 (six) hours as needed for wheezing or shortness of breath. 06/30/17  Yes Sharen Heck J, PA-C  lurasidone (LATUDA) 40 MG TABS tablet Take 40 mg by mouth daily.   Yes [provider]  hydrOXYzine (ATARAX/VISTARIL) 50 MG tablet Take 1 tablet (50 mg total) by mouth 3 (three) times  daily for 10 days. 01/26/18 02/05/18  Mortis, Jerrel Ivory I, PA-C  ibuprofen (ADVIL,MOTRIN) 600 MG tablet Take 1 tablet (600 mg total) by mouth every 6 (six) hours as needed. Patient not taking: Reported on 01/26/2018 09/26/17   Jaynie Crumble, PA-C  traMADol (ULTRAM) 50 MG tablet Take 1 tablet (50 mg total) by mouth every 6 (six) hours as needed. Patient not taking: Reported on 01/26/2018 09/26/17   Jaynie Crumble, PA-C    Family History Family History  Problem Relation Age of Onset  . Schizophrenia Mother     Social History Social History   Tobacco Use  . Smoking status: Current Every Day Smoker    Types: Cigarettes  . Smokeless tobacco: Never Used  Substance Use Topics  . Alcohol use: Yes    Alcohol/week: 0.0 standard drinks    Comment: occasional  . Drug use: No     Allergies   Hydrocodone-acetaminophen; Ketorolac; Paxil [paroxetine hcl]; and Penicillins   Review of Systems Review of Systems  Constitutional: Negative for diaphoresis and fatigue.  HENT: Negative.   Eyes: Negative for visual disturbance.  Respiratory: Positive for chest tightness and shortness of breath. Negative for cough.   Cardiovascular: Negative for chest pain and palpitations.  Gastrointestinal: Negative.   Musculoskeletal: Negative.   Neurological: Negative for dizziness, syncope, weakness, light-headedness and numbness.  Psychiatric/Behavioral: The patient is nervous/anxious.      Physical Exam Updated Vital Signs BP (!) 140/101 (BP Location: Right Arm)   Pulse  94   Temp 98 F (36.7 C) (Oral)   Resp 18   Ht 5\' 6"  (1.676 m)   Wt (!) 166 kg   SpO2 95%   BMI 59.07 kg/m   Physical Exam  Constitutional: She is oriented to person, place, and time. No distress.  Obese. Sitting comfortably in the bed.  Eyes: Pupils are equal, round, and reactive to light. Conjunctivae, EOM and lids are normal.  Neck: Normal range of motion and full passive range of motion without pain. Neck supple.    Cardiovascular: Normal rate, regular rhythm and normal heart sounds.  No murmur heard. Pulmonary/Chest: Effort normal and breath sounds normal.  Musculoskeletal: Normal range of motion.  Neurological: She is alert and oriented to person, place, and time. She has normal strength. No cranial nerve deficit or sensory deficit. She exhibits normal muscle tone.  Skin: Skin is warm. Capillary refill takes less than 2 seconds.  Nursing note and vitals reviewed.  ED Treatments / Results  Labs (all labs ordered are listed, but only abnormal results are displayed) Labs Reviewed  CBC - Abnormal; Notable for the following components:      Result Value   RBC 5.37 (*)    Hemoglobin 11.3 (*)    MCV 70.0 (*)    MCH 21.0 (*)    RDW 20.0 (*)    Platelets 469 (*)    All other components within normal limits  BASIC METABOLIC PANEL  I-STAT TROPONIN, ED    EKG EKG Interpretation  Date/Time:  Monday January 26 2018 09:37:13 EDT Ventricular Rate:  81 PR Interval:    QRS Duration: 87 QT Interval:  383 QTC Calculation: 445 R Axis:   96 Text Interpretation:  Sinus rhythm Prolonged PR interval Borderline right axis deviation Low voltage, precordial leads Abnormal ekg Confirmed by Gerhard Munch [phone]) on 01/26/2018 9:52:02 AM   Radiology Dg Chest 2 View  Result Date: 01/26/2018 CLINICAL DATA:  Panic attack this morning at home, chest pain, history hypertension, smoker EXAM: CHEST - 2 VIEW COMPARISON:  07/07/2017 FINDINGS: Enlargement of cardiac silhouette with slight pulmonary vascular congestion. Mediastinal contours normal. Lungs clear. No pulmonary infiltrate, pleural effusion or pneumothorax. Improved bibasilar aeration versus prior exam. Bones unremarkable. IMPRESSION: Enlargement of cardiac silhouette with slight pulmonary vascular congestion. No acute abnormalities. Electronically Signed   By: Ulyses Southward M.D.   On: 01/26/2018 10:32    Procedures Procedures (including critical care  time)  Medications Ordered in ED Medications - No data to display   Initial Impression / Assessment and Plan / ED Course  Triage vital signs and the nursing notes have been reviewed.  Pertinent labs & imaging results that were available during care of the patient were reviewed and considered in medical decision making (see chart for details).  Patient presents to the ED following a reported panic attack that occurred in the context of conflict with her son. This conflict escalated to having law enforcement involved. Patient states that the anxiety subsided on its own in < 5 minutes and patient states that she is currently asymptomatic. Physical exam unremarkable. History consistent with acute anxiety; however, given pt's body habitus, tobacco use history and HTN, it is important to rule out possible cardiac cause to this event. Will proceed with ED chest pain work-up.  Clinical Course as of Jan 26 1118  Mon Jan 26, 2018  9604 EKG showed NSR. No ST elevations/depressions or signs of acute ischemia or infarct. This is reassuring in combination with negative  troponin which assists in evaluating and ruling out an acute cardiac process.   [GM]  1030 Remaining labs unremarkable. Patient has thrombocytosis, but baseline platelets > 400.   [GM]  1103 CXR unchanged from last one in 06/2017. Enlarged cardiac silhoutette seen in both. No abnormalities on pulmonary exam.   [GM]  1119 Hypertensive upon arrival. Patient has not had anti-hypertensive medication today yet. No s/s of end organ damage.   [GM]    Clinical Course User Index [GM] Mortis, Sharyon Medicus, PA-C   Final Clinical Impressions(s) / ED Diagnoses  1. Anxiety Attack. Advised to follow-up with PCP or psychiatrist if anxiety is persistent and to discuss possible pharmacotherapy. Rx for Vistaril prescribed for acute anxiety attacks.  Dispo: Home. After thorough clinical evaluation, this patient is determined to be medically stable and can  be safely discharged with the previously mentioned treatment and/or outpatient follow-up/referral(s). At this time, there are no other apparent medical conditions that require further screening, evaluation or treatment.  Final diagnoses:  Anxiety attack    ED Discharge Orders         Ordered    hydrOXYzine (ATARAX/VISTARIL) 50 MG tablet  3 times daily     01/26/18 [address] [place] I, PA-C 01/26/18 1117    Mortis, [place] I, PA-C 01/26/18 1119    Gerhard Munch, MD 01/27/18 [id number]

## 2018-01-26 NOTE — Discharge Instructions (Addendum)
Your lab work, EKG and chest x-ray today were normal. There is no acute or life threatening cardiac or pulmonary cause to your episode today. It is likely that this episode today was an anxiety attack.  If these episodes become more frequent, I recommend that you speak with your PCP or psychiatrist about this. I have prescribed a medication called Hydroxyzine (Vistaril) that can be used if you have another one of these episodes.  Thank you for allowing me to take care of you today!

## 2018-02-14 ENCOUNTER — Other Ambulatory Visit: Payer: Self-pay

## 2018-02-14 ENCOUNTER — Emergency Department (HOSPITAL_COMMUNITY)
Admission: EM | Admit: 2018-02-14 | Discharge: 2018-02-15 | Disposition: A | Discharge status: Home / Self Care | Payer: BLUE CROSS/BLUE SHIELD | Attending: Emergency Medicine | Admitting: Emergency Medicine

## 2018-02-14 ENCOUNTER — Emergency Department (HOSPITAL_COMMUNITY): Payer: BLUE CROSS/BLUE SHIELD

## 2018-02-14 DIAGNOSIS — R05 Cough: Secondary | ICD-10-CM | POA: Diagnosis not present

## 2018-02-14 DIAGNOSIS — F17210 Nicotine dependence, cigarettes, uncomplicated: Secondary | ICD-10-CM | POA: Insufficient documentation

## 2018-02-14 DIAGNOSIS — R079 Chest pain, unspecified: Secondary | ICD-10-CM | POA: Diagnosis present

## 2018-02-14 DIAGNOSIS — R059 Cough, unspecified: Secondary | ICD-10-CM

## 2018-02-14 DIAGNOSIS — Z79899 Other long term (current) drug therapy: Secondary | ICD-10-CM | POA: Insufficient documentation

## 2018-02-14 DIAGNOSIS — I10 Essential (primary) hypertension: Secondary | ICD-10-CM | POA: Diagnosis not present

## 2018-02-14 LAB — CBC
HCT: 37.9 % (ref 36.0–46.0)
HEMOGLOBIN: 10.7 g/dL — AB (ref 12.0–15.0)
MCH: 20 pg — AB (ref 26.0–34.0)
MCHC: 28.2 g/dL — ABNORMAL LOW (ref 30.0–36.0)
MCV: 70.7 fL — ABNORMAL LOW (ref 80.0–100.0)
Platelets: 421 10*3/uL — ABNORMAL HIGH (ref 150–400)
RBC: 5.36 MIL/uL — AB (ref 3.87–5.11)
RDW: 20.6 % — ABNORMAL HIGH (ref 11.5–15.5)
WBC: 12.8 10*3/uL — ABNORMAL HIGH (ref 4.0–10.5)
nRBC: 0 % (ref 0.0–0.2)

## 2018-02-14 LAB — I-STAT BETA HCG BLOOD, ED (MC, WL, AP ONLY): I-stat hCG, quantitative: <5 m[IU]/mL (ref <5)

## 2018-02-14 LAB — I-STAT TROPONIN, ED: TROPONIN I, POC: 0 ng/mL (ref 0.00–0.08)

## 2018-02-14 NOTE — ED Triage Notes (Addendum)
Patient c/o productive cough (yellow phlegm) x2 days and CP that began today.

## 2018-02-15 ENCOUNTER — Encounter (HOSPITAL_COMMUNITY): Payer: Self-pay | Admitting: Student

## 2018-02-15 LAB — BASIC METABOLIC PANEL
ANION GAP: 7 (ref 5–15)
BUN: 11 mg/dL (ref 6–20)
CALCIUM: 8.9 mg/dL (ref 8.9–10.3)
CHLORIDE: 106 mmol/L (ref 98–111)
CO2: 25 mmol/L (ref 22–32)
Creatinine, Ser: 0.81 mg/dL (ref 0.44–1.00)
GFR calc Af Amer: >60 mL/min (ref >60)
GFR calc non Af Amer: >60 mL/min (ref >60)
Glucose, Bld: 100 mg/dL — ABNORMAL HIGH (ref 70–99)
Potassium: 3.7 mmol/L (ref 3.5–5.1)
Sodium: 138 mmol/L (ref 135–145)

## 2018-02-15 LAB — I-STAT TROPONIN, ED: Troponin i, poc: 0.02 ng/mL (ref 0.00–0.08)

## 2018-02-15 MED ORDER — BENZONATATE 100 MG PO CAPS: 100.0000 mg | ORAL_CAPSULE | Freq: Three times a day (TID) | ORAL | 0 refills | Status: DC | PRN

## 2018-02-15 MED ORDER — METHOCARBAMOL 500 MG PO TABS: 500.0000 mg | ORAL_TABLET | Freq: Three times a day (TID) | ORAL | 0 refills | Status: DC | PRN

## 2018-02-15 MED ORDER — FLUTICASONE PROPIONATE 50 MCG/ACT NA SUSP: 1.0000 | Freq: Every day | NASAL | 0 refills | Status: DC

## 2018-02-15 NOTE — Discharge Instructions (Signed)
You were seen in the emergency department today for chest pain and cough. Your work-up in the emergency department has been overall reassuring. Your labs have been fairly normal and or similar to previous blood work you have had done, your hemoglobin was low at 10.8 which is similar to prior hemoglobins on record, this is consistent with anemia. Your EKG and the enzyme we use to check your heart did not show an acute heart attack at this time. Your chest x-ray did not show pneumonia.   At this time we suspect that you have a viral illness causing your cough.  We suspect that the cough is irritated your lung lining and chest wall we are sending you with the following medications to help with this:  -Flonase to be used 1 spray in each nostril daily.  This medication is used to treat your congestion.  -Tessalon can be taken once every 8 hours as needed.  This medication is used to treat your cough.  - Robaxin is the muscle relaxer I have prescribed, this is meant to help with muscle tightness. Be aware that this medication may make you drowsy therefore the first time you take this it should be at a time you are in an environment where you can rest. Do not drive or operate heavy machinery when taking this medication. Do not drink alcohol or take other sedating medications with this medicine such as narcotics or benzodiazepines.   You make take Tylenol per over the counter dosing with these medications.   We have prescribed you new medication(s) today. Discuss the medications prescribed today with your pharmacist as they can have adverse effects and interactions with your other medicines including over the counter and prescribed medications. Seek medical evaluation if you start to experience new or abnormal symptoms after taking one of these medicines, seek care immediately if you start to experience difficulty breathing, feeling of your throat closing, facial swelling, or rash as these could be indications  of a more serious allergic reaction\  We would like you to follow-up with your primary care provider within 3 days for reevaluation.  Please return to the ER for new or worsening symptoms including but not limited to worsening pain, trouble breathing, inability to keep fluids down, decreased urine output, fever, or any other concerns.

## 2018-02-15 NOTE — ED Notes (Signed)
Patient verbalizes understanding of discharge instructions. Opportunity for questioning and answers were provided. Armband removed by staff, pt discharged from ED ambulatory to waiting room for ride.

## 2018-02-15 NOTE — ED Provider Notes (Signed)
[facility] EMERGENCY DEPARTMENT Provider Note   CSN: [id number] Arrival date & time: 02/14/18  2317   History   Chief Complaint Chief Complaint  Patient presents with  . Chest Pain    HPI Breanna Moore is a 42 y.o. female with a history of tobacco abuse, hypertension, obesity, and depression who presents to the ED with complaints of cough for the past 3 days and chest pain which started this evening.  She states she has had cough productive of yellow phlegm sputum for the past few days with associated general malaise.  She states that this evening she developed some anterior chest discomfort with coughing as well as some shortness of breath with coughing.  She states the chest pain will come and go and does seem to be with cough, deep breaths, and with palpation of the chest wall.  No other specific alleviating or aggravating factors.  Pain is nonexertional.  She denies fever, chills, nausea, vomiting, diaphoresis, lightheadedness, dizziness, or syncope.  She denies sore throat, ear pain, or congestion. Denies leg pain/swelling, hemoptysis, recent surgery/trauma, recent long travel, hormone use, personal hx of cancer, or hx of DVT/PE.   HPI  Past Medical History:  Diagnosis Date  . Depression   . Depression   . Hypertension   . Obesity     Patient Active Problem List   Diagnosis Date Noted  . Shortness of breath on exertion 08/11/2017  . Essential hypertension 08/11/2017  . Morbid obesity (HCC) 08/11/2017  . Cigarette smoker 08/11/2017  . Schizo-affective psychosis (HCC) 05/25/2014    Past Surgical History:  Procedure Laterality Date  . CESAREAN SECTION       OB History   None      Home Medications    Prior to Admission medications   Medication Sig Start Date End Date Taking? Authorizing Provider  albuterol (PROVENTIL HFA;VENTOLIN HFA) 108 (90 Base) MCG/ACT inhaler Inhale 1-2 puffs into the lungs every 6 (six) hours as needed for wheezing or  shortness of breath. 06/30/17   Liberty Handy, PA-C  ibuprofen (ADVIL,MOTRIN) 600 MG tablet Take 1 tablet (600 mg total) by mouth every 6 (six) hours as needed. Patient not taking: Reported on 01/26/2018 09/26/17   Jaynie Crumble, PA-C  traMADol (ULTRAM) 50 MG tablet Take 1 tablet (50 mg total) by mouth every 6 (six) hours as needed. Patient not taking: Reported on 01/26/2018 09/26/17   Jaynie Crumble, PA-C    Family History Family History  Problem Relation Age of Onset  . Schizophrenia Mother     Social History Social History   Tobacco Use  . Smoking status: Current Every Day Smoker    Types: Cigarettes  . Smokeless tobacco: Never Used  Substance Use Topics  . Alcohol use: Yes    Alcohol/week: 0.0 standard drinks    Comment: occasional  . Drug use: No     Allergies   Hydrocodone-acetaminophen; Ketorolac; Paxil [paroxetine hcl]; and Penicillins   Review of Systems Review of Systems  Constitutional: Negative for chills, diaphoresis and fever.  HENT: Negative for congestion, ear pain, sore throat and voice change.   Respiratory: Positive for cough and shortness of breath (w/ coughing spell, otherwise no).   Cardiovascular: Positive for chest pain. Negative for leg swelling.  Gastrointestinal: Negative for abdominal pain, diarrhea, nausea and vomiting.  Neurological: Negative for syncope, weakness, light-headedness and numbness.  All other systems reviewed and are negative.    Physical Exam Updated Vital Signs BP 129/81   Pulse  85   Temp 99.8 F (37.7 C) (Oral)   Resp (!) 27   Ht 5\' 5"  (1.651 m)   Wt (!) 166 kg   LMP 01/31/2018 (Approximate)   SpO2 98%   BMI 60.90 kg/m   Physical Exam  Constitutional: She appears well-developed and well-nourished. No distress.  Obese  HENT:  Head: Normocephalic and atraumatic.  Right Ear: Tympanic membrane is not perforated, not erythematous, not retracted and not bulging.  Left Ear: Tympanic membrane is not  perforated, not erythematous, not retracted and not bulging.  Nose: Mucosal edema present. Right sinus exhibits no maxillary sinus tenderness and no frontal sinus tenderness. Left sinus exhibits no maxillary sinus tenderness and no frontal sinus tenderness.  Mouth/Throat: Uvula is midline and oropharynx is clear and moist. No oropharyngeal exudate or posterior oropharyngeal erythema.  Eyes: Pupils are equal, round, and reactive to light. Conjunctivae are normal. Right eye exhibits no discharge. Left eye exhibits no discharge.  Neck: Normal range of motion. Neck supple.  Cardiovascular: Normal rate and regular rhythm.  No murmur heard. Pulses:      Radial pulses are 2+ on the right side, and 2+ on the left side.  Pulmonary/Chest: Effort normal and breath sounds normal. No respiratory distress. She has no wheezes. She has no rhonchi. She has no rales. She exhibits tenderness (anterior without overlying skin changes or palpable crepitus). She exhibits no crepitus, no edema, no deformity, no swelling and no retraction.  Abdominal: Soft. She exhibits no distension. There is no tenderness.  Musculoskeletal:       Right lower leg: She exhibits no tenderness and no edema.       Left lower leg: She exhibits no tenderness and no edema.  Lymphadenopathy:    She has no cervical adenopathy.  Neurological: She is alert.  Skin: Skin is warm and dry. No rash noted.  Psychiatric: She has a normal mood and affect. Her behavior is normal.  Nursing note and vitals reviewed.    ED Treatments / Results  Labs (all labs ordered are listed, but only abnormal results are displayed) Labs Reviewed  BASIC METABOLIC PANEL - Abnormal; Notable for the following components:      Result Value   Glucose, Bld 100 (*)    All other components within normal limits  CBC - Abnormal; Notable for the following components:   WBC 12.8 (*)    RBC 5.36 (*)    Hemoglobin 10.7 (*)    MCV 70.7 (*)    MCH 20.0 (*)    MCHC 28.2  (*)    RDW 20.6 (*)    Platelets 421 (*)    All other components within normal limits  I-STAT TROPONIN, ED  I-STAT BETA HCG BLOOD, ED ([facility], [facility], [facility] ONLY)  I-STAT TROPONIN, ED    EKG EKG Interpretation  Date/Time:  Saturday February 14 2018 23:24:27 EDT Ventricular Rate:  93 PR Interval:  206 QRS Duration: 78 QT Interval:  356 QTC Calculation: 442 R Axis:   22 Text Interpretation:  Normal sinus rhythm Normal ECG No acute changes No significant change since last tracing Confirmed by Derwood Kaplan ([id number]) on 02/15/2018 3:44:03 AM   Radiology Dg Chest 2 View  Result Date: 02/14/2018 CLINICAL DATA:  Dyspnea EXAM: CHEST - 2 VIEW COMPARISON:  01/26/2018 chest radiograph. FINDINGS: Stable cardiomediastinal silhouette with normal heart size. No pneumothorax. No pleural effusion. No pulmonary edema. Curvilinear right lung base opacity. No consolidative airspace disease. IMPRESSION: Curvilinear right lung base opacity compatible with  mild scarring or atelectasis. Otherwise no active disease in the chest. Electronically Signed   By: Delbert Phenix M.D.   On: 02/14/2018 23:50   Procedures Procedures (including critical care time)  Medications Ordered in ED Medications - No data to display   Initial Impression / Assessment and Plan / ED Course  I have reviewed the triage vital signs and the nursing notes.  Pertinent labs & imaging results that were available during my care of the patient were reviewed by me and considered in my medical decision making (see chart for details).   Patient presents to the emergency department with complaint of chest pain.  Patient nontoxic-appearing, no apparent distress, vitals without acute abnormality, mild elevated BP normalized throughout visit, vitals with tachypnea however feel this is false on monitor as patient with normal respiratory rate all each of my assessments.  She has a fairly benign physical exam.  Her lungs are clear to auscultation bilaterally.   Of note patient does have reproducible pain with palpation of  anterior chest wall.  Her work-up in the emergency department has been overall reassuring.  Labs reviewed: She is a nonspecific leukocytosis of 12.8.  Hemoglobin of 10.7 consistent with her microcytic hypochromic anemia, similar to prior on record.  No significant electrolyte disturbance.  Chest x-ray without infiltrate, pneumothorax, effusion, or edema.  Patient low risk Wells, PERC negative, doubt pulmonary embolism.  She has a low risk heart score, EKG without obvious ischemia, delta troponin negative, doubt ACS.  Pain is not a tearing sensation, symmetric pulses, no widening of the mediastinum noted on chest x-ray, doubt dissection.  Given patient with cough and reproducibility of pain with palpation of the chest wall I have high suspicion for musculoskeletal etiology versus pleurisy or combination of these.  Likely viral. She does not appear to be in respiratory distress.  She is afebrile.  We will treat supportively with Tessalon, Flonase, and Robaxin and PCP follow-up.  Return precautions given. I discussed results, treatment plan, need for PCP follow-up, and return precautions with the patient. Provided opportunity for questions, patient confirmed understanding and is in agreement with plan.   Final Clinical Impressions(s) / ED Diagnoses   Final diagnoses:  Chest pain, unspecified type  Cough    ED Discharge Orders         Ordered    methocarbamol (ROBAXIN) 500 MG tablet  Every 8 hours PRN     02/15/18 0408    fluticasone (FLONASE) 50 MCG/ACT nasal spray  Daily     02/15/18 0408    benzonatate (TESSALON) 100 MG capsule  3 times daily PRN     02/15/18 0408           Kristapher Dubuque R, PA-C 02/15/18 1324    Derwood Kaplan, MD 02/16/18 [phone]

## 2018-10-10 ENCOUNTER — Other Ambulatory Visit: Payer: Self-pay

## 2018-10-10 ENCOUNTER — Emergency Department (HOSPITAL_COMMUNITY)
Admission: EM | Admit: 2018-10-10 | Discharge: 2018-10-11 | Disposition: A | Discharge status: Home / Self Care | Payer: BLUE CROSS/BLUE SHIELD | Attending: Emergency Medicine | Admitting: Emergency Medicine

## 2018-10-10 ENCOUNTER — Encounter (HOSPITAL_COMMUNITY): Payer: Self-pay | Admitting: Emergency Medicine

## 2018-10-10 DIAGNOSIS — F332 Major depressive disorder, recurrent severe without psychotic features: Secondary | ICD-10-CM | POA: Diagnosis not present

## 2018-10-10 DIAGNOSIS — R45851 Suicidal ideations: Secondary | ICD-10-CM | POA: Insufficient documentation

## 2018-10-10 DIAGNOSIS — I10 Essential (primary) hypertension: Secondary | ICD-10-CM | POA: Insufficient documentation

## 2018-10-10 DIAGNOSIS — F17210 Nicotine dependence, cigarettes, uncomplicated: Secondary | ICD-10-CM | POA: Insufficient documentation

## 2018-10-10 DIAGNOSIS — F410 Panic disorder [episodic paroxysmal anxiety] without agoraphobia: Secondary | ICD-10-CM

## 2018-10-10 DIAGNOSIS — Z79899 Other long term (current) drug therapy: Secondary | ICD-10-CM | POA: Diagnosis not present

## 2018-10-10 DIAGNOSIS — F250 Schizoaffective disorder, bipolar type: Secondary | ICD-10-CM | POA: Diagnosis present

## 2018-10-10 DIAGNOSIS — F600 Paranoid personality disorder: Secondary | ICD-10-CM | POA: Diagnosis not present

## 2018-10-10 DIAGNOSIS — R42 Dizziness and giddiness: Secondary | ICD-10-CM | POA: Insufficient documentation

## 2018-10-10 DIAGNOSIS — F259 Schizoaffective disorder, unspecified: Secondary | ICD-10-CM | POA: Diagnosis present

## 2018-10-10 DIAGNOSIS — F251 Schizoaffective disorder, depressive type: Secondary | ICD-10-CM | POA: Diagnosis not present

## 2018-10-10 NOTE — ED Notes (Signed)
Bed: WTR5 Expected date:  Expected time:  Means of arrival:  Comments: 

## 2018-10-10 NOTE — ED Provider Notes (Signed)
[facility]-EMERGENCY DEPT Provider Note   CSN: [id number] Arrival date & time: 10/10/18  2115    History   Chief Complaint Chief Complaint  Patient presents with  . Suicidal    HPI Breanna Moore is a 43 y.o. female.     The history is provided by the patient and medical records. No language interpreter was used.   Breanna Moore is a 43 y.o. female  with a PMH of schizoaffective psychosis, hypertension, depression who presents to the Emergency Department complaining of panic attacks which started about 2 weeks ago.  She does report having history of similar feelings which she attributes to panic attacks about 15 years ago, but have been doing relatively well for last several years.  For the last 2 weeks, she states more days than not, she feels very depressed and anxious.  She will suddenly get a "wave of darkness" that passes over her whole body.  She feels a little lightheaded when this happens and gets extremely anxious.  Last several minutes and then will resolve without any intervention.  As noted, she does state this feels similar to episodes that she has had in the past which she sought care for and was diagnosed with "chemical imbalance" causing depression.  She denies any chest pain or shortness of breath.  She has had no syncopal episodes.  Today, she started feeling suicidal and has never felt that way before.  She states that she really ponders about it, she knows that she would not truly kill herself, but is thinking about it which scares her.  No plan right now.  Denies any homicidal ideations.  No auditory or visual hallucinations.   Past Medical History:  Diagnosis Date  . Depression   . Depression   . Hypertension   . Obesity     Patient Active Problem List   Diagnosis Date Noted  . Shortness of breath on exertion 08/11/2017  . Essential hypertension 08/11/2017  . Morbid obesity (HCC) 08/11/2017  . Cigarette smoker 08/11/2017  .  Schizo-affective psychosis (HCC) 05/25/2014    Past Surgical History:  Procedure Laterality Date  . CESAREAN SECTION       OB History   No obstetric history on file.      Home Medications    Prior to Admission medications   Medication Sig Start Date End Date Taking? Authorizing Provider  ARIPiprazole (ABILIFY) 5 MG tablet Take 5 mg by mouth at bedtime. 08/28/18  Yes [provider]  FLUoxetine (PROZAC) 20 MG capsule Take 20 mg by mouth at bedtime.  08/28/18  Yes [provider]  hydrochlorothiazide (HYDRODIURIL) 25 MG tablet Take 25 mg by mouth daily. 06/26/18  Yes [provider]  hydrOXYzine (ATARAX/VISTARIL) 10 MG tablet Take 10 mg by mouth 3 (three) times daily as needed for anxiety.  08/28/18  Yes [provider]  lisinopril (ZESTRIL) 10 MG tablet Take 10 mg by mouth daily. 06/26/18  Yes [provider]  albuterol (PROVENTIL HFA;VENTOLIN HFA) 108 (90 Base) MCG/ACT inhaler Inhale 1-2 puffs into the lungs every 6 (six) hours as needed for wheezing or shortness of breath. Patient not taking: Reported on 10/10/2018 06/30/17   Liberty HandyGibbons, Claudia J, PA-C  benzonatate (TESSALON) 100 MG capsule Take 1 capsule (100 mg total) by mouth 3 (three) times daily as needed. Patient not taking: Reported on 10/10/2018 02/15/18   Petrucelli, Lelon MastSamantha R, PA-C  fluticasone (FLONASE) 50 MCG/ACT nasal spray Place 1 spray into both nostrils daily. Patient not  taking: Reported on 10/10/2018 02/15/18   Petrucelli, Pleas KochSamantha R, PA-C  methocarbamol (ROBAXIN) 500 MG tablet Take 1 tablet (500 mg total) by mouth every 8 (eight) hours as needed. Patient not taking: Reported on 10/10/2018 02/15/18   Petrucelli, Pleas KochSamantha R, PA-C    Family History Family History  Problem Relation Age of Onset  . Schizophrenia Mother     Social History Social History   Tobacco Use  . Smoking status: Current Every Day Smoker    Types: Cigarettes  . Smokeless tobacco: Never Used  Substance Use  Topics  . Alcohol use: Yes    Alcohol/week: 0.0 standard drinks    Comment: occasional  . Drug use: No     Allergies   Hydrocodone-acetaminophen, Ketorolac, Paxil [paroxetine hcl], and Penicillins   Review of Systems Review of Systems  Neurological: Positive for light-headedness. Negative for dizziness, seizures, syncope, speech difficulty, weakness, numbness and headaches.  Psychiatric/Behavioral: Positive for suicidal ideas. Negative for hallucinations and self-injury.  All other systems reviewed and are negative.    Physical Exam Updated Vital Signs BP (!) 155/96 (BP Location: Right Arm)   Pulse 67   Temp 98.7 F (37.1 C) (Oral)   Resp 14   Ht 5\' 5"  (1.651 m)   Wt (!) 155.6 kg   LMP 10/07/2018   SpO2 99%   BMI 57.08 kg/m   Physical Exam Vitals signs and nursing note reviewed.  Constitutional:      General: She is not in acute distress.    Appearance: She is well-developed.  HENT:     Head: Normocephalic and atraumatic.  Neck:     Musculoskeletal: Neck supple.  Cardiovascular:     Rate and Rhythm: Normal rate and regular rhythm.     Heart sounds: Normal heart sounds. No murmur.  Pulmonary:     Effort: Pulmonary effort is normal. No respiratory distress.     Breath sounds: Normal breath sounds.  Abdominal:     General: There is no distension.     Palpations: Abdomen is soft.     Tenderness: There is no abdominal tenderness.  Skin:    General: Skin is warm and dry.  Neurological:     Mental Status: She is alert and oriented to person, place, and time.      ED Treatments / Results  Labs (all labs ordered are listed, but only abnormal results are displayed) Labs Reviewed  CBC WITH DIFFERENTIAL/PLATELET - Abnormal; Notable for the following components:      Result Value   RBC 5.60 (*)    Hemoglobin 11.4 (*)    MCV 71.6 (*)    MCH 20.4 (*)    MCHC 28.4 (*)    RDW 21.3 (*)    All other components within normal limits  BASIC METABOLIC PANEL  CBC  WITH DIFFERENTIAL/PLATELET    EKG EKG Interpretation  Date/Time:  Sunday October 11 2018 00:00:22 EDT Ventricular Rate:  71 PR Interval:    QRS Duration: 103 QT Interval:  403 QTC Calculation: 435 R Axis:   19 Text Interpretation:  Sinus rhythm Prolonged PR interval Low voltage, extremity and precordial leads Baseline wander in lead(s) V5 Confirmed by Geoffery LyonseLo, Douglas ([id number]) on 10/11/2018 12:25:40 AM   Radiology No results found.  Procedures Procedures (including critical care time)  Medications Ordered in ED Medications  ARIPiprazole (ABILIFY) tablet 5 mg (has no administration in time range)  FLUoxetine (PROZAC) capsule 20 mg (has no administration in time range)  hydrochlorothiazide (HYDRODIURIL) tablet 25 mg (  has no administration in time range)  lisinopril (ZESTRIL) tablet 10 mg (has no administration in time range)  hydrOXYzine (ATARAX/VISTARIL) tablet 10 mg (has no administration in time range)     Initial Impression / Assessment and Plan / ED Course  I have reviewed the triage vital signs and the nursing notes.  Pertinent labs & imaging results that were available during my care of the patient were reviewed by me and considered in my medical decision making (see chart for details).       Breanna Moore is a 43 y.o. female who presents to ED for suicidal thoughts and panic attacks.  She does report feeling very lightheaded and anxious during these presumed panic attacks.  Will obtain screening labs and EKG to ensure no medical condition leading to these new episodes over the last 2 weeks.  If all reassuring, likely TTS eval for suicidal thoughts.  EKG without concerning findings. Labs including CBC reviewed with attending, Dr. Stark Jock. Feels we can medically clear. TTS recommending inpatient treatment. Awaiting placement.   Final Clinical Impressions(s) / ED Diagnoses   Final diagnoses:  Suicidal thoughts  Panic attacks    ED Discharge Orders    None       Ward,  Ozella Almond, PA-C 10/11/18 0321    Fredia Sorrow, MD 10/11/18 [phone]

## 2018-10-10 NOTE — ED Triage Notes (Signed)
Pt reports having suicidal ideation for the last [redacted] weeks along with anxiety. Pt reports that she was taken [place] several months ago and did well but since then pt reports increasing anxiety.

## 2018-10-11 DIAGNOSIS — F410 Panic disorder [episodic paroxysmal anxiety] without agoraphobia: Secondary | ICD-10-CM

## 2018-10-11 DIAGNOSIS — F251 Schizoaffective disorder, depressive type: Secondary | ICD-10-CM

## 2018-10-11 DIAGNOSIS — R45851 Suicidal ideations: Secondary | ICD-10-CM

## 2018-10-11 DIAGNOSIS — F600 Paranoid personality disorder: Secondary | ICD-10-CM

## 2018-10-11 LAB — CBC WITH DIFFERENTIAL/PLATELET
Abs Immature Granulocytes: 0.01 10*3/uL (ref 0.00–0.07)
Basophils Absolute: 0 10*3/uL (ref 0.0–0.1)
Basophils Relative: 0 %
Eosinophils Absolute: 0.1 10*3/uL (ref 0.0–0.5)
Eosinophils Relative: 2 %
HCT: 40.1 % (ref 36.0–46.0)
Hemoglobin: 11.4 g/dL — ABNORMAL LOW (ref 12.0–15.0)
Immature Granulocytes: 0 %
Lymphocytes Relative: 37 %
Lymphs Abs: 3 10*3/uL (ref 0.7–4.0)
MCH: 20.4 pg — ABNORMAL LOW (ref 26.0–34.0)
MCHC: 28.4 g/dL — ABNORMAL LOW (ref 30.0–36.0)
MCV: 71.6 fL — ABNORMAL LOW (ref 80.0–100.0)
Monocytes Absolute: 0.5 10*3/uL (ref 0.1–1.0)
Monocytes Relative: 6 %
Neutro Abs: 4.4 10*3/uL (ref 1.7–7.7)
Neutrophils Relative %: 55 %
Platelets: 374 10*3/uL (ref 150–400)
RBC: 5.6 MIL/uL — ABNORMAL HIGH (ref 3.87–5.11)
RDW: 21.3 % — ABNORMAL HIGH (ref 11.5–15.5)
WBC: 8.1 10*3/uL (ref 4.0–10.5)
nRBC: 0 % (ref 0.0–0.2)

## 2018-10-11 LAB — BASIC METABOLIC PANEL
Anion gap: 8 (ref 5–15)
BUN: 16 mg/dL (ref 6–20)
CO2: 27 mmol/L (ref 22–32)
Calcium: 9 mg/dL (ref 8.9–10.3)
Chloride: 106 mmol/L (ref 98–111)
Creatinine, Ser: 0.91 mg/dL (ref 0.44–1.00)
GFR calc Af Amer: >60 mL/min (ref >60)
GFR calc non Af Amer: >60 mL/min (ref >60)
Glucose, Bld: 99 mg/dL (ref 70–99)
Potassium: 4 mmol/L (ref 3.5–5.1)
Sodium: 141 mmol/L (ref 135–145)

## 2018-10-11 MED ORDER — HYDROXYZINE HCL 10 MG PO TABS
10.0000 mg | ORAL_TABLET | Freq: Three times a day (TID) | ORAL | Status: DC | PRN
  Administered 2018-10-11 (×2): 10 mg via ORAL
  Filled 2018-10-11 (×2): qty 1

## 2018-10-11 MED ORDER — ARIPIPRAZOLE 5 MG PO TABS
5.0000 mg | ORAL_TABLET | Freq: Every day | ORAL | Status: DC
  Administered 2018-10-11: 5 mg via ORAL
  Filled 2018-10-11: qty 1

## 2018-10-11 MED ORDER — FLUOXETINE HCL 20 MG PO CAPS
20.0000 mg | ORAL_CAPSULE | Freq: Every day | ORAL | Status: DC
  Administered 2018-10-11: 20 mg via ORAL
  Filled 2018-10-11: qty 1

## 2018-10-11 MED ORDER — HYDROCHLOROTHIAZIDE 12.5 MG PO CAPS
25.0000 mg | ORAL_CAPSULE | Freq: Every day | ORAL | Status: DC
  Administered 2018-10-11: 25 mg via ORAL
  Filled 2018-10-11 (×2): qty 2

## 2018-10-11 MED ORDER — LISINOPRIL 10 MG PO TABS
10.0000 mg | ORAL_TABLET | Freq: Every day | ORAL | Status: DC
  Administered 2018-10-11: 10 mg via ORAL
  Filled 2018-10-11 (×2): qty 1
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
## 2018-10-11 NOTE — BH Assessment (Signed)
Clinician was provided verbal consent by pt to contact pt's mother for collateral. The information provided by pt's mother can be found in pt's [facility] Assessment dated and time-stamped 6/14 12:15AM.   Ardeth Sportsman, pt's mother: [phone]

## 2018-10-11 NOTE — ED Notes (Signed)
ED Provider at bedside. 

## 2018-10-11 NOTE — ED Notes (Signed)
Patient is talking with Psych Provider via phone now.

## 2018-10-11 NOTE — Discharge Instructions (Addendum)
Follow-up as per behavioral health. Return for any new or worse symptoms. 

## 2018-10-11 NOTE — ED Notes (Signed)
TTS being done at this time via telepsych machine.  

## 2018-10-11 NOTE — ED Notes (Signed)
TTS called to advise that pt meets inpatient criteria but no beds are available at this time.

## 2018-10-11 NOTE — ED Notes (Signed)
TTS is attempting to call patient in room to assess patient via phone.  Room 16 phone number [phone]

## 2018-10-11 NOTE — ED Notes (Signed)
Bed: [facility]16 Expected date:  Expected time:  Means of arrival:  Comments: Triage 5 

## 2018-10-11 NOTE — Consult Note (Addendum)
[facility] Face-to-Face Psychiatry Consult   Reason for Consult:  Increased anxiety and paranoia Referring Physician: [facility]: Breanna Moore MRN:  [id number] Principal Diagnosis: Schizo-affective psychosis (HCC) Diagnosis:  Principal Problem:   Schizo-affective psychosis (HCC)   Total Time spent with patient: 30 minutes  Subjective:   Breanna Moore is a 43 y.o. female patient admitted with "increased panic attacks and suicidal thoughts."  HPI:    Per Tele Assessment note by Duard BradySamantha Kaufman 10/11/2018 12:15 am:  Breanna Moore is a 43 y.o. female who was [facility] via EMS due to experiencing SI and panic attacks. Pt shares her medication was changed 3-4 months ago and that, since that time, she has not been doing as well mentally. She shares she has been stressed at work, she has been concerned about her two grown sons and that she perhaps made mistakes with them, and she continues to ruminate over mistakes from her past. Pt endorses SI, stating it's been at the "forefront of [her] mind for several days." She shares she attempted to kill herself once as a teenager and that she has been hospitalized 4 times, the last hospitalization occurring in 2015. She denies having a plan, stating that she was in too much of a panic to identify a plan any time she was feeling suicidal. Pt shares she has been experiencing AH at all times, including threats that someone is out to get her. She states she has court on November 05, 2018. Pt otherwise denies HI, NSSIB, access to guns/weapons (pt's mother verified this), or use of substances.  Pt shared symptoms of depression, including more time in bed, insomnia, showering less, decreased hygiene, difficulties concentrating, and feelings of worthlessness, guilt, and irritability. Pt's mother shared that pt text-messaged her "goodbye" today but that she was busy so she didn't see it; when pt's mother did not respond, pt called her mother to ask why  she didn't respond to her message. Pt's mother reports pt also text-messaged the same message to her sister and to several other relatives.  Pt gave verbal consent for clinician to contact her mother, Verlon AuKaren Turner, for collateral information. The information gathered in that phone conversation can be found in the information above.  Pt is oriented x4. Her recent and remote memory is intact. Pt was cooperative throughout the intake process. Pt's insight, judgement, and impulse control is impaired at this time.  Per assessment 10/11/2018:  Patient interviewed on the phone due to technology problems. She stated "I was feeling very overwhelmed yesterday. I am still nervous. But I need to get back to work. I have an appointment next month with a new Provider since [facility] stopped taking my insurance. I can keep getting refills of my medications through them. I can try to take the vistaril twice daily for my anxiety. I am not having any thoughts of harming myself and am not hearing voices. I feel ready to go home later today." Patient interviewed with Dr. Jannifer FranklinAkintayo. She at this time does not meet criteria for inpatient psychiatric admission. Denies continued suicidal ideation. 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   Past Psychiatric History: Schizoaffective disorder  Risk to Self: Suicidal Ideation: No Suicidal Intent: Denies 10/11/2018 Is patient at risk for suicide?: No Suicidal Plan?: No Access to Means: No What has been your use of drugs/alcohol within the last 12 months?: Pt denies SA How many times?: 1 Other Self Harm Risks: Pt unable to get psych appt for another  month Triggers for Past Attempts: Unknown Intentional Self Injurious Behavior: None Risk to Others: Homicidal Ideation: No Thoughts of Harm to Others: No Current Homicidal Intent: No Current Homicidal Plan: No Access to Homicidal Means: No Identified Victim: None noted History of harm to others?: No Assessment  of Violence: On admission Violent Behavior Description: None noted Does patient have access to weapons?: No(Pt & pt's mother denied access to guns/weapons) Criminal Charges Pending?: No Does patient have a court date: Yes Court Date: 11/05/18 Prior Inpatient Therapy: Prior Inpatient Therapy: Yes Prior Therapy Dates: Multiple--most recent was in 2015 Prior Therapy Facilty/Provider(s): Pt cannot remember Reason for Treatment: Bipolar Disorder, SI Prior Outpatient Therapy: Prior Outpatient Therapy: Yes Prior Therapy Dates: Multiple Prior Therapy Facilty/Provider(s): [facility] for psych and tx Reason for Treatment: Bipolar disorder, schizophrenia; recently changed dx? Does patient have an ACCT team?: No Does patient have Intensive In-House Services?  : No Does patient have [facility] services? : No Does patient have P4CC services?: No  Past Medical History:  Past Medical History:  Diagnosis Date  . Depression   . Depression   . Hypertension   . Obesity     Past Surgical History:  Procedure Laterality Date  . CESAREAN SECTION     Family History:  Family History  Problem Relation Age of Onset  . Schizophrenia Mother    Family Psychiatric  History: See above Social History:  Social History   Substance and Sexual Activity  Alcohol Use Yes  . Alcohol/week: 0.0 standard drinks   Comment: occasional     Social History   Substance and Sexual Activity  Drug Use No    Social History   Socioeconomic History  . Marital status: Single    Spouse name: Not on file  . Number of children: Not on file  . Years of education: Not on file  . Highest education level: Not on file  Occupational History  . Not on file  Social Needs  . Financial resource strain: Not on file  . Food insecurity    Worry: Not on file    Inability: Not on file  . Transportation needs    Medical: Not on file    Non-medical: Not on file  Tobacco Use  . Smoking status: Current Every Day Smoker    Types:  Cigarettes  . Smokeless tobacco: Never Used  Substance and Sexual Activity  . Alcohol use: Yes    Alcohol/week: 0.0 standard drinks    Comment: occasional  . Drug use: No  . Sexual activity: Never    Birth control/protection: None  Lifestyle  . Physical activity    Days per week: Not on file    Minutes per session: Not on file  . Stress: Not on file  Relationships  . Social Herbalist on phone: Not on file    Gets together: Not on file    Attends religious service: Not on file    Active member of club or organization: Not on file    Attends meetings of clubs or organizations: Not on file    Relationship status: Not on file  Other Topics Concern  . Not on file  Social History Narrative  . Not on file   Additional Social History:    Allergies:   Allergies  Allergen Reactions  . Hydrocodone-Acetaminophen   . Ketorolac   . Paxil [Paroxetine Hcl]   . Penicillins Rash    Has patient had a PCN reaction causing immediate rash, facial/tongue/throat swelling,  SOB or lightheadedness with hypotension: Yes Has patient had a PCN reaction causing severe rash involving mucus membranes or skin necrosis: No Has patient had a PCN reaction that required hospitalization: No Has patient had a PCN reaction occurring within the last 10 years: No If all of the above answers are "NO", then may proceed with Cephalosporin use.     Labs:  Results for orders placed or performed during the hospital encounter of 10/10/18 (from the past 48 hour(s))  Basic metabolic panel     Status: None   Collection Time: 10/10/18 10:50 PM  Result Value Ref Range   Sodium 141 135 - 145 mmol/L   Potassium 4.0 3.5 - 5.1 mmol/L   Chloride 106 98 - 111 mmol/L   CO2 27 22 - 32 mmol/L   Glucose, Bld 99 70 - 99 mg/dL   BUN 16 6 - 20 mg/dL   Creatinine, Ser 4.540.91 0.44 - 1.00 mg/dL   Calcium 9.0 8.9 - 09.810.3 mg/dL   GFR calc non Af Amer >60 >60 mL/min   GFR calc Af Amer >60 >60 mL/min   Anion gap 8 5 - 15     Comment: Performed at [facility], [address] [place], [place] [address]  CBC with Differential/Platelet     Status: Abnormal   Collection Time: 10/11/18  2:44 AM  Result Value Ref Range   WBC 8.1 4.0 - 10.5 K/uL   RBC 5.60 (H) 3.87 - 5.11 MIL/uL   Hemoglobin 11.4 (L) 12.0 - 15.0 g/dL   HCT 78.240.1 95.636.0 - 21.346.0 %   MCV 71.6 (L) 80.0 - 100.0 fL   MCH 20.4 (L) 26.0 - 34.0 pg   MCHC 28.4 (L) 30.0 - 36.0 g/dL   RDW 08.621.3 (H) 57.811.5 - 46.915.5 %   Platelets 374 150 - 400 K/uL   nRBC 0.0 0.0 - 0.2 %   Neutrophils Relative % 55 %   Neutro Abs 4.4 1.7 - 7.7 K/uL   Lymphocytes Relative 37 %   Lymphs Abs 3.0 0.7 - 4.0 K/uL   Monocytes Relative 6 %   Monocytes Absolute 0.5 0.1 - 1.0 K/uL   Eosinophils Relative 2 %   Eosinophils Absolute 0.1 0.0 - 0.5 K/uL   Basophils Relative 0 %   Basophils Absolute 0.0 0.0 - 0.1 K/uL   WBC Morphology VACUOLATED NEUTROPHILS    Immature Granulocytes 0 %   Abs Immature Granulocytes 0.01 0.00 - 0.07 K/uL   Reactive, Benign Lymphocytes PRESENT    Target Cells PRESENT     Comment: Performed at [facility], [address] [place], [place] [address]    Current Facility-Administered Medications  Medication Dose Route Frequency Provider Last Rate Last Dose  . ARIPiprazole (ABILIFY) tablet 5 mg  5 mg Oral QHS Ward, Chase PicketJaime Pilcher, PA-C   5 mg at 10/11/18 0428  . FLUoxetine (PROZAC) capsule 20 mg  20 mg Oral QHS Ward, Chase PicketJaime Pilcher, PA-C   20 mg at 10/11/18 0429  . hydrochlorothiazide (MICROZIDE) capsule 25 mg  25 mg Oral Daily Ward, Chase PicketJaime Pilcher, PA-C      . hydrOXYzine (ATARAX/VISTARIL) tablet 10 mg  10 mg Oral TID PRN Ward, Chase PicketJaime Pilcher, PA-C   10 mg at 10/11/18 0429  . lisinopril (ZESTRIL) tablet 10 mg  10 mg Oral Daily Ward, Chase PicketJaime Pilcher, PA-C       Current Outpatient Medications  Medication Sig Dispense Refill  . ARIPiprazole (ABILIFY) 5 MG tablet Take 5 mg by  mouth at bedtime.    Marland Kitchen. FLUoxetine (PROZAC) 20 MG  capsule Take 20 mg by mouth at bedtime.     . hydrochlorothiazide (HYDRODIURIL) 25 MG tablet Take 25 mg by mouth daily.    . hydrOXYzine (ATARAX/VISTARIL) 10 MG tablet Take 10 mg by mouth 3 (three) times daily as needed for anxiety.     Marland Kitchen. lisinopril (ZESTRIL) 10 MG tablet Take 10 mg by mouth daily.    Marland Kitchen. albuterol (PROVENTIL HFA;VENTOLIN HFA) 108 (90 Base) MCG/ACT inhaler Inhale 1-2 puffs into the lungs every 6 (six) hours as needed for wheezing or shortness of breath. (Patient not taking: Reported on 10/10/2018) 1 Inhaler 0  . benzonatate (TESSALON) 100 MG capsule Take 1 capsule (100 mg total) by mouth 3 (three) times daily as needed. (Patient not taking: Reported on 10/10/2018) 30 capsule 0  . fluticasone (FLONASE) 50 MCG/ACT nasal spray Place 1 spray into both nostrils daily. (Patient not taking: Reported on 10/10/2018) 16 g 0  . methocarbamol (ROBAXIN) 500 MG tablet Take 1 tablet (500 mg total) by mouth every 8 (eight) hours as needed. (Patient not taking: Reported on 10/10/2018) 21 tablet 0    Musculoskeletal: Unable to assess  Psychiatric Specialty Exam: Physical Exam  Review of Systems  Psychiatric/Behavioral: Negative for depression, hallucinations, memory loss, substance abuse and suicidal ideas. The patient is nervous/anxious. The patient does not have insomnia.     Blood pressure 118/64, pulse 72, temperature 98.3 F (36.8 C), temperature source Oral, resp. rate 16, height 5\' 5"  (1.651 m), weight (!) 155.6 kg, last menstrual period 10/07/2018, SpO2 97 %.Body mass index is 57.08 kg/m.  General Appearance: Done via phone due to technological problems  Eye Contact:  Unable to assess as visit done by phone  Speech:  Clear and Coherent  Volume:  Normal  Mood:  Anxious  Affect:  Appropriate  Thought Process:  Coherent and Goal Directed  Orientation:  Full (Time, Place, and Person)  Thought Content:  Rumination  Suicidal Thoughts:  No  Homicidal Thoughts:  No  Memory:  Immediate;    Good Recent;   Good Remote;   Good  Judgement:  Good  Insight:  Good  Psychomotor Activity:  Unable to assess as visit via phone  Concentration:  Concentration: Good and Attention Span: Good  Recall:  Good  Fund of Knowledge:  Good  Language:  Good  Akathisia:  No  Handed:  Right  AIMS (if indicated):     Assets:  Communication Skills Desire for Improvement Financial Resources/Insurance Housing Intimacy Leisure Time Physical Health Resilience Social Support Talents/Skills  ADL's:  Intact  Cognition:  WNL  Sleep:        Treatment Plan Summary: Plan Discharge home to follow up with outpatient Provider  Discussed medications with Dr. Jannifer FranklinAkintayo. Patient is to continue her prozac/abilify she reports [facility]. To help address her anxiety until she can start with her [facility] health Provider [facility] we recommend to take her vistaril 10 mg twice daily.   Disposition: No evidence of imminent risk to self or others at present.   Patient does not meet criteria for psychiatric inpatient admission. Supportive therapy provided about ongoing stressors. Discussed crisis plan, support from social network, calling 911, coming to the Emergency Department, and calling Suicide Hotline.  Fransisca KaufmannAVIS, LAURA, NP 10/11/2018 10:50 AM  Patient seen face-to-face for psychiatric evaluation, chart reviewed and case discussed with the physician extender and developed treatment plan. Reviewed the information documented and agree with  the treatment plan. Corena Pilgrim, MD

## 2019-05-27 IMAGING — DX DG CHEST 2V
2 series · 2 of 2 positions shown · non-contrast
Comparison: 01/26/2018 chest radiograph.

CLINICAL DATA: Dyspnea

EXAM:
CHEST - 2 VIEW

[chest pa]
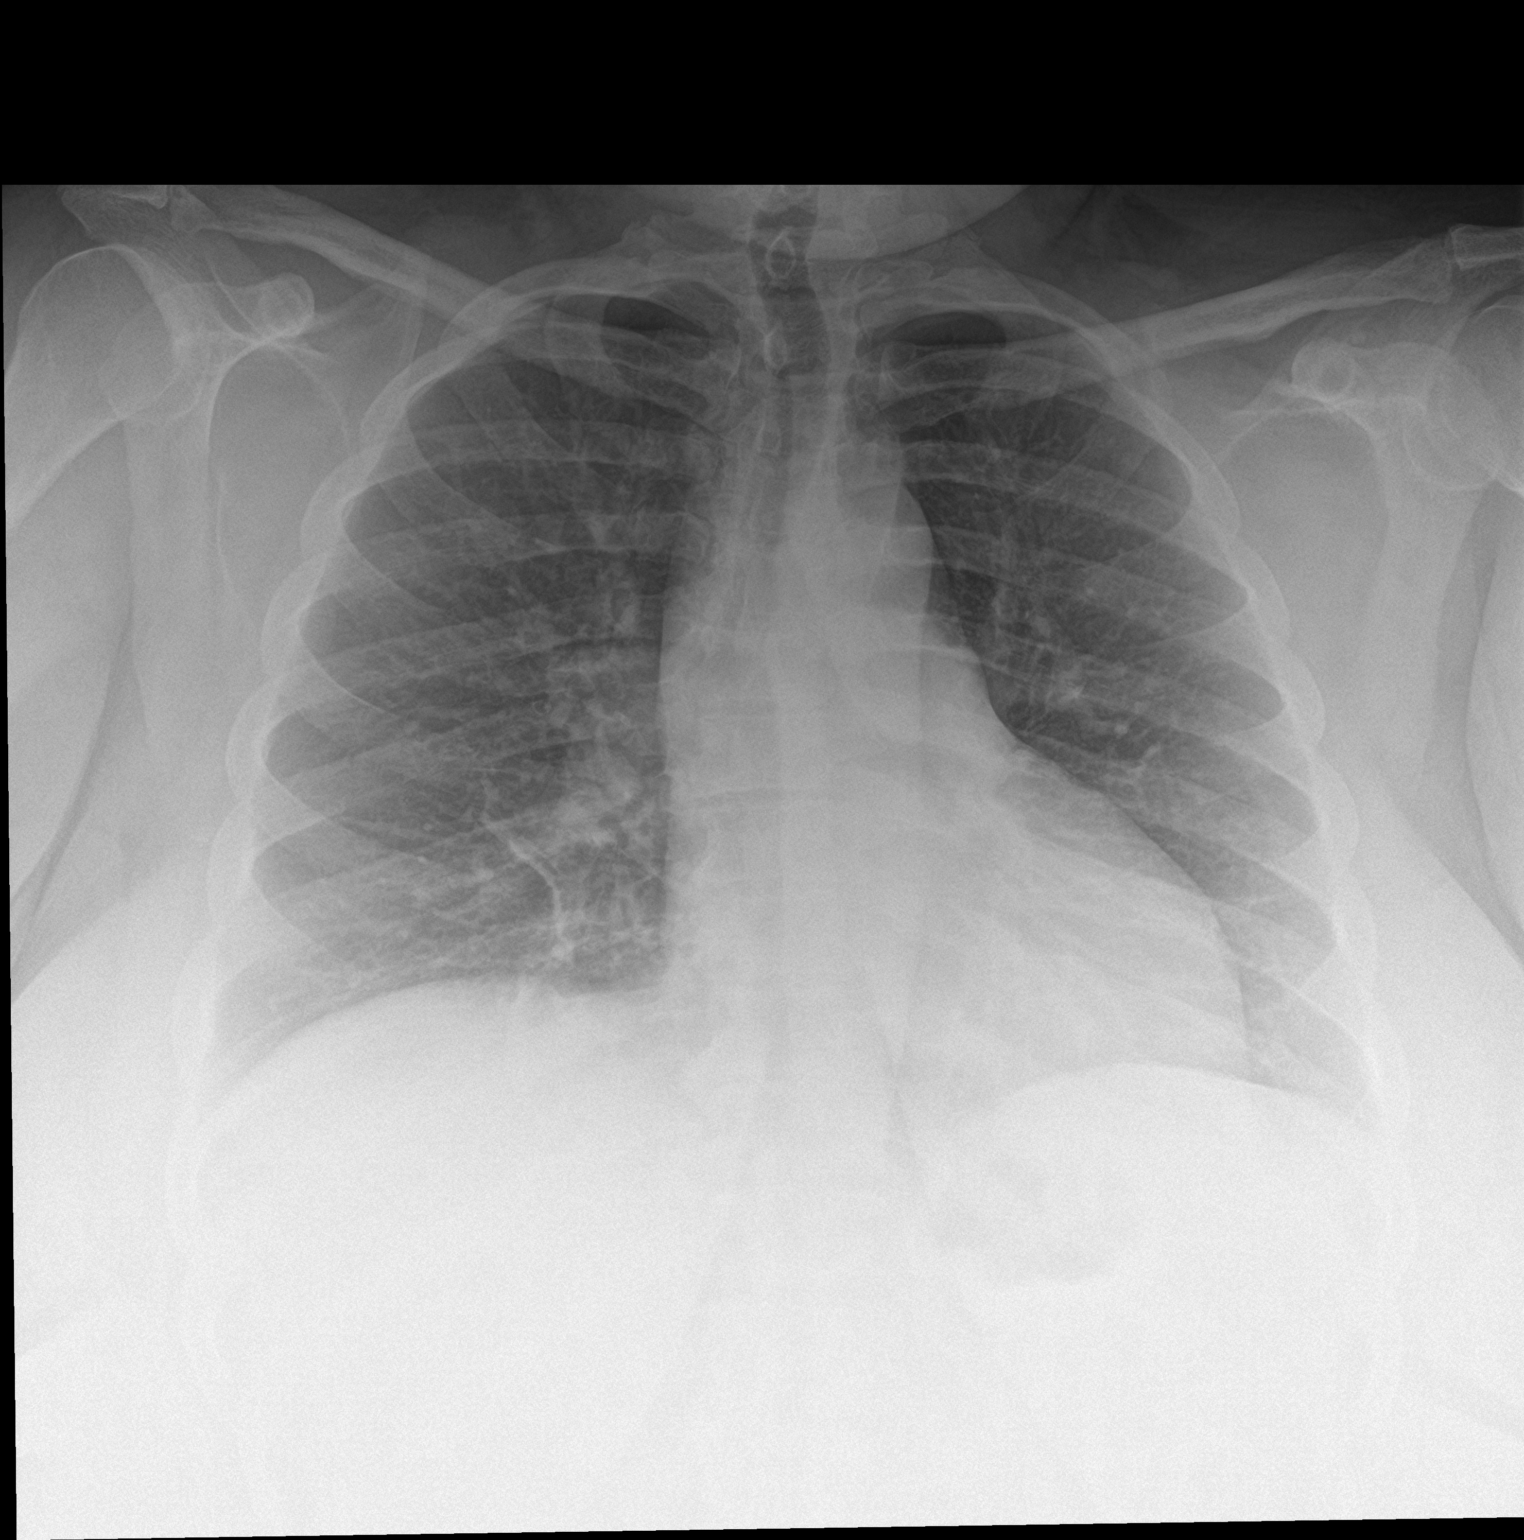

[chest lat]
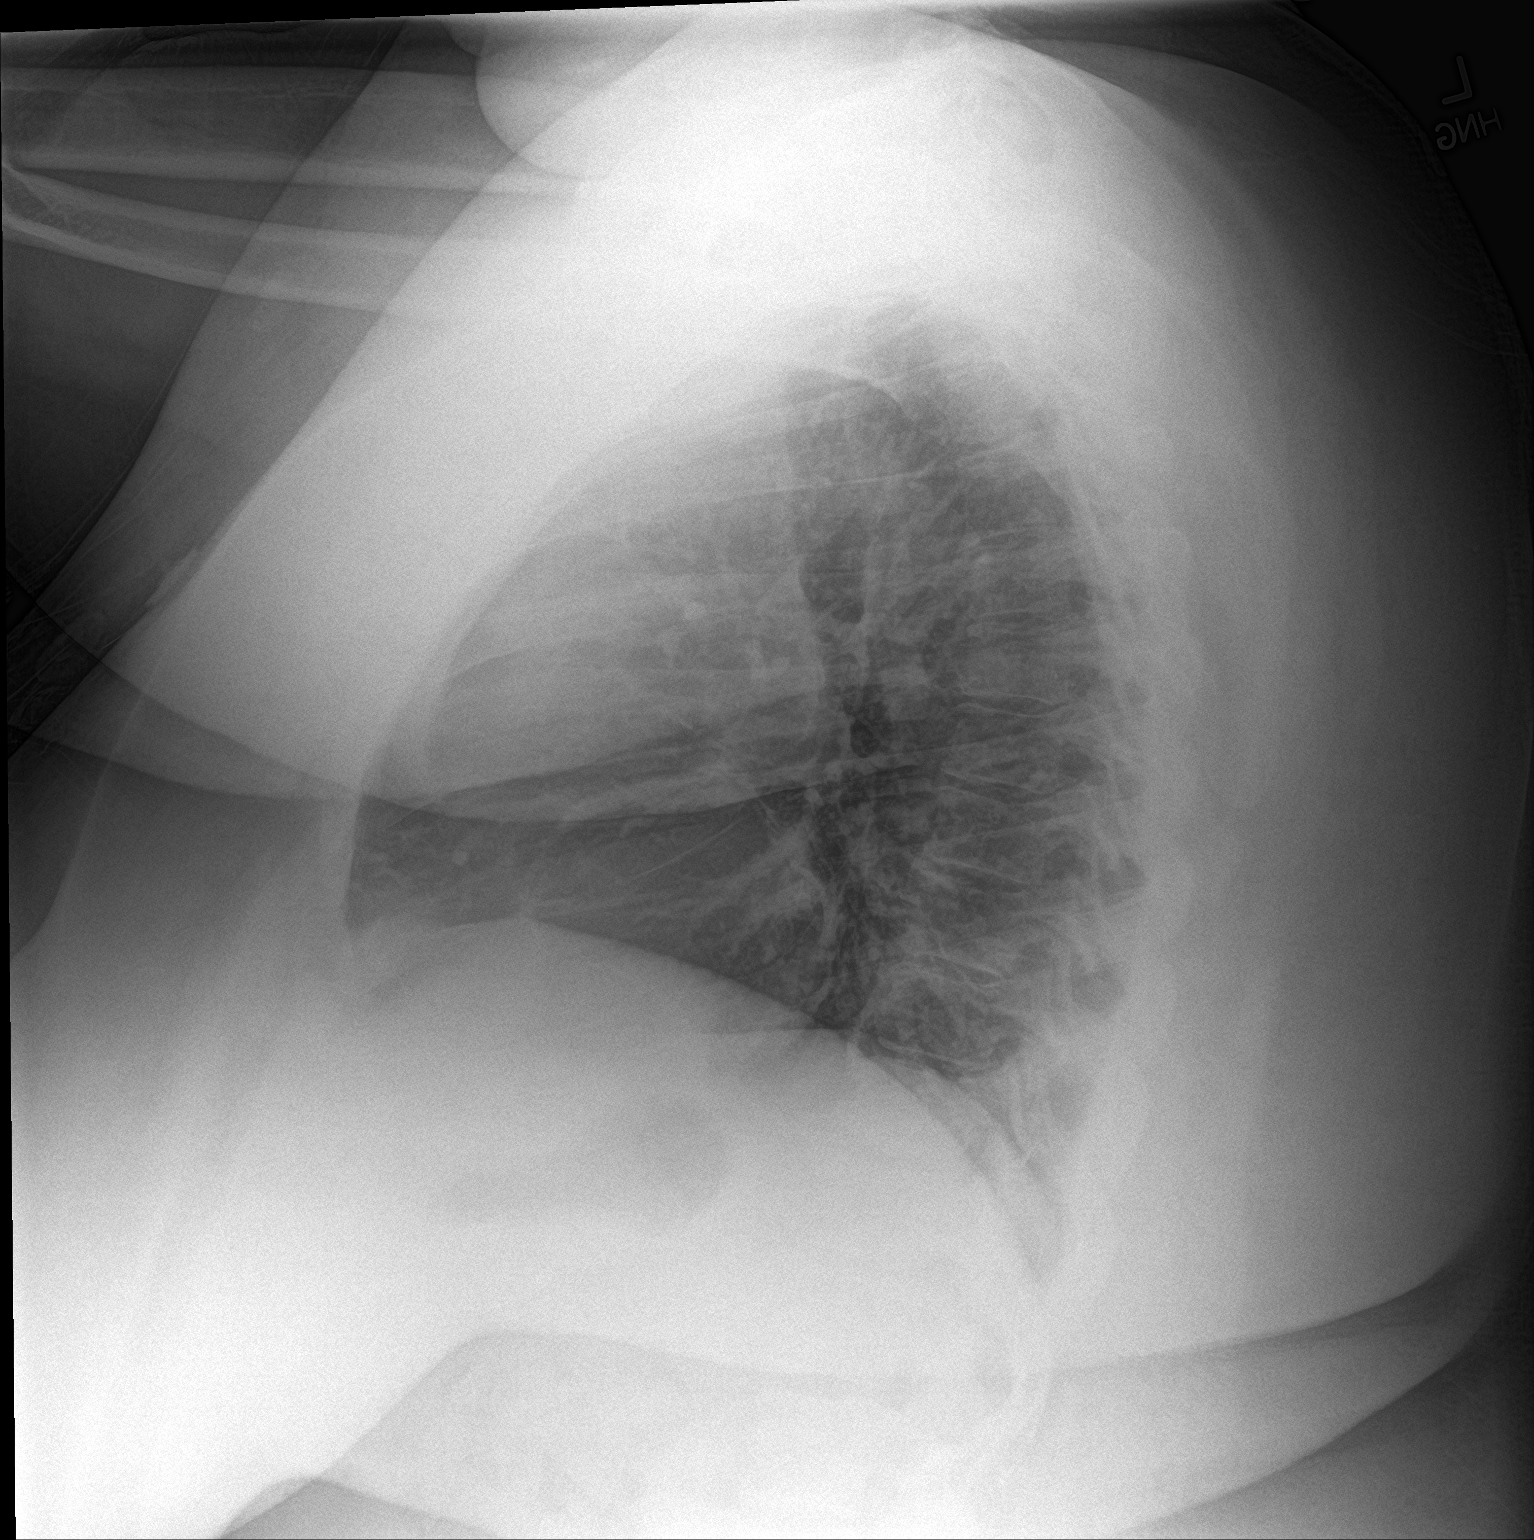

[2 of 2 positions shown; findings below may reference images not displayed]

FINDINGS: Stable cardiomediastinal silhouette with normal heart size. No
pneumothorax. No pleural effusion. No pulmonary edema. Curvilinear
right lung base opacity. No consolidative airspace disease.
IMPRESSION: Curvilinear right lung base opacity compatible with mild scarring or
atelectasis. Otherwise no active disease in the chest.

## 2019-06-15 ENCOUNTER — Ambulatory Visit: Payer: BLUE CROSS/BLUE SHIELD | Admitting: Cardiology

## 2019-06-17 ENCOUNTER — Other Ambulatory Visit: Payer: Self-pay

## 2019-06-17 ENCOUNTER — Encounter: Payer: Self-pay | Admitting: Cardiology

## 2019-06-17 ENCOUNTER — Telehealth: Payer: BLUE CROSS/BLUE SHIELD | Admitting: Cardiology

## 2019-06-28 ENCOUNTER — Ambulatory Visit: Payer: BLUE CROSS/BLUE SHIELD | Attending: Internal Medicine

## 2019-06-28 ENCOUNTER — Other Ambulatory Visit: Payer: Self-pay

## 2019-06-28 DIAGNOSIS — Z23 Encounter for immunization: Secondary | ICD-10-CM | POA: Insufficient documentation

## 2019-06-28 NOTE — Progress Notes (Signed)
   Covid-19 Vaccination Clinic  Name:  Cleopatra Sardo    MRN: [id number] DOB: March 20, 1976  06/28/2019  Ms. Criss was observed post Covid-19 immunization for 15 minutes without incidence. She was provided with Vaccine Information Sheet and instruction to access the V-Safe system.   Ms. Montalvo was instructed to call 911 with any severe reactions post vaccine: Marland Kitchen Difficulty breathing  . Swelling of your face and throat  . A fast heartbeat  . A bad rash all over your body  . Dizziness and weakness    Immunizations Administered    Name Date Dose VIS Date Route   Pfizer COVID-19 Vaccine 06/28/2019 10:14 AM 0.3 mL 04/09/2019 Intramuscular   Manufacturer: [employer], [employer]   Lot: [id number]   NDC: [address]-6

## 2019-07-21 ENCOUNTER — Ambulatory Visit: Payer: BLUE CROSS/BLUE SHIELD | Attending: Internal Medicine

## 2019-07-21 DIAGNOSIS — Z23 Encounter for immunization: Secondary | ICD-10-CM

## 2019-07-21 NOTE — Progress Notes (Signed)
   Covid-19 Vaccination Clinic  Name:  Breanna Moore    MRN: [id number] DOB: Mar 21, 1976  07/21/2019  Ms. Breanna Moore was observed post Covid-19 immunization for 15 minutes without incident. She was provided with Vaccine Information Sheet and instruction to access the V-Safe system.   Ms. Breanna Moore was instructed to call 911 with any severe reactions post vaccine: Marland Kitchen Difficulty breathing  . Swelling of face and throat  . A fast heartbeat  . A bad rash all over body  . Dizziness and weakness   Immunizations Administered    Name Date Dose VIS Date Route   Pfizer COVID-19 Vaccine 07/21/2019 10:14 AM 0.3 mL 04/09/2019 Intramuscular   Manufacturer: [employer], [employer]   Lot: [id number]   NDC: [address]-1

## 2019-08-28 ENCOUNTER — Emergency Department (HOSPITAL_COMMUNITY): Payer: BLUE CROSS/BLUE SHIELD

## 2019-08-28 ENCOUNTER — Inpatient Hospital Stay (HOSPITAL_COMMUNITY)
Admission: EM | Admit: 2019-08-28 | Discharge: 2019-09-08 | DRG: 205 | Disposition: A | Discharge status: Home / Self Care | Payer: BLUE CROSS/BLUE SHIELD | Attending: Family Medicine | Admitting: Family Medicine

## 2019-08-28 ENCOUNTER — Other Ambulatory Visit: Payer: Self-pay

## 2019-08-28 ENCOUNTER — Encounter (HOSPITAL_COMMUNITY): Payer: Self-pay | Admitting: Emergency Medicine

## 2019-08-28 DIAGNOSIS — Z818 Family history of other mental and behavioral disorders: Secondary | ICD-10-CM

## 2019-08-28 DIAGNOSIS — R0902 Hypoxemia: Secondary | ICD-10-CM

## 2019-08-28 DIAGNOSIS — E662 Morbid (severe) obesity with alveolar hypoventilation: Principal | ICD-10-CM | POA: Diagnosis present

## 2019-08-28 DIAGNOSIS — E063 Autoimmune thyroiditis: Secondary | ICD-10-CM | POA: Diagnosis present

## 2019-08-28 DIAGNOSIS — R0789 Other chest pain: Secondary | ICD-10-CM | POA: Diagnosis present

## 2019-08-28 DIAGNOSIS — Z880 Allergy status to penicillin: Secondary | ICD-10-CM

## 2019-08-28 DIAGNOSIS — E042 Nontoxic multinodular goiter: Secondary | ICD-10-CM | POA: Diagnosis present

## 2019-08-28 DIAGNOSIS — G4710 Hypersomnia, unspecified: Secondary | ICD-10-CM | POA: Diagnosis present

## 2019-08-28 DIAGNOSIS — R0600 Dyspnea, unspecified: Secondary | ICD-10-CM | POA: Diagnosis not present

## 2019-08-28 DIAGNOSIS — I712 Thoracic aortic aneurysm, without rupture: Secondary | ICD-10-CM | POA: Diagnosis present

## 2019-08-28 DIAGNOSIS — R062 Wheezing: Secondary | ICD-10-CM | POA: Diagnosis present

## 2019-08-28 DIAGNOSIS — R0609 Other forms of dyspnea: Secondary | ICD-10-CM

## 2019-08-28 DIAGNOSIS — Z888 Allergy status to other drugs, medicaments and biological substances status: Secondary | ICD-10-CM

## 2019-08-28 DIAGNOSIS — I10 Essential (primary) hypertension: Secondary | ICD-10-CM | POA: Diagnosis present

## 2019-08-28 DIAGNOSIS — F17210 Nicotine dependence, cigarettes, uncomplicated: Secondary | ICD-10-CM | POA: Diagnosis present

## 2019-08-28 DIAGNOSIS — F329 Major depressive disorder, single episode, unspecified: Secondary | ICD-10-CM | POA: Diagnosis present

## 2019-08-28 DIAGNOSIS — Z20822 Contact with and (suspected) exposure to covid-19: Secondary | ICD-10-CM | POA: Diagnosis present

## 2019-08-28 DIAGNOSIS — N393 Stress incontinence (female) (male): Secondary | ICD-10-CM | POA: Diagnosis present

## 2019-08-28 DIAGNOSIS — Z6841 Body Mass Index (BMI) 40.0 and over, adult: Secondary | ICD-10-CM

## 2019-08-28 DIAGNOSIS — R0602 Shortness of breath: Secondary | ICD-10-CM | POA: Diagnosis present

## 2019-08-28 DIAGNOSIS — J9811 Atelectasis: Secondary | ICD-10-CM | POA: Diagnosis present

## 2019-08-28 DIAGNOSIS — Z885 Allergy status to narcotic agent status: Secondary | ICD-10-CM

## 2019-08-28 DIAGNOSIS — Z72820 Sleep deprivation: Secondary | ICD-10-CM

## 2019-08-28 DIAGNOSIS — I7121 Aneurysm of the ascending aorta, without rupture: Secondary | ICD-10-CM | POA: Diagnosis present

## 2019-08-28 DIAGNOSIS — I119 Hypertensive heart disease without heart failure: Secondary | ICD-10-CM | POA: Diagnosis present

## 2019-08-28 DIAGNOSIS — J9622 Acute and chronic respiratory failure with hypercapnia: Secondary | ICD-10-CM | POA: Diagnosis present

## 2019-08-28 DIAGNOSIS — F259 Schizoaffective disorder, unspecified: Secondary | ICD-10-CM | POA: Diagnosis present

## 2019-08-28 DIAGNOSIS — G4733 Obstructive sleep apnea (adult) (pediatric): Secondary | ICD-10-CM | POA: Diagnosis present

## 2019-08-28 DIAGNOSIS — D72829 Elevated white blood cell count, unspecified: Secondary | ICD-10-CM | POA: Diagnosis not present

## 2019-08-28 DIAGNOSIS — Z79899 Other long term (current) drug therapy: Secondary | ICD-10-CM

## 2019-08-28 DIAGNOSIS — E041 Nontoxic single thyroid nodule: Secondary | ICD-10-CM

## 2019-08-28 DIAGNOSIS — E872 Acidosis: Secondary | ICD-10-CM | POA: Diagnosis present

## 2019-08-28 DIAGNOSIS — J449 Chronic obstructive pulmonary disease, unspecified: Secondary | ICD-10-CM | POA: Diagnosis present

## 2019-08-28 DIAGNOSIS — E049 Nontoxic goiter, unspecified: Secondary | ICD-10-CM

## 2019-08-28 DIAGNOSIS — J9621 Acute and chronic respiratory failure with hypoxia: Secondary | ICD-10-CM | POA: Diagnosis present

## 2019-08-28 DIAGNOSIS — G4734 Idiopathic sleep related nonobstructive alveolar hypoventilation: Secondary | ICD-10-CM | POA: Diagnosis present

## 2019-08-28 DIAGNOSIS — D509 Iron deficiency anemia, unspecified: Secondary | ICD-10-CM | POA: Diagnosis present

## 2019-08-28 DIAGNOSIS — I517 Cardiomegaly: Secondary | ICD-10-CM

## 2019-08-28 HISTORY — DX: Body Mass Index (BMI) 40.0 and over, adult: Z684

## 2019-08-28 HISTORY — DX: Morbid (severe) obesity due to excess calories: E66.01

## 2019-08-28 LAB — CBC WITH DIFFERENTIAL/PLATELET
Abs Immature Granulocytes: 0.03 10*3/uL (ref 0.00–0.07)
Basophils Absolute: 0 10*3/uL (ref 0.0–0.1)
Basophils Relative: 0 %
Eosinophils Absolute: 0.2 10*3/uL (ref 0.0–0.5)
Eosinophils Relative: 2 %
HCT: 38 % (ref 36.0–46.0)
Hemoglobin: 9.6 g/dL — ABNORMAL LOW (ref 12.0–15.0)
Immature Granulocytes: 0 %
Lymphocytes Relative: 23 %
Lymphs Abs: 2.1 10*3/uL (ref 0.7–4.0)
MCH: 18 pg — ABNORMAL LOW (ref 26.0–34.0)
MCHC: 25.3 g/dL — ABNORMAL LOW (ref 30.0–36.0)
MCV: 71.4 fL — ABNORMAL LOW (ref 80.0–100.0)
Monocytes Absolute: 0.5 10*3/uL (ref 0.1–1.0)
Monocytes Relative: 5 %
Neutro Abs: 6.3 10*3/uL (ref 1.7–7.7)
Neutrophils Relative %: 70 %
Platelets: 375 10*3/uL (ref 150–400)
RBC: 5.32 MIL/uL — ABNORMAL HIGH (ref 3.87–5.11)
RDW: 21.6 % — ABNORMAL HIGH (ref 11.5–15.5)
WBC: 9.1 10*3/uL (ref 4.0–10.5)
nRBC: 0.3 % — ABNORMAL HIGH (ref 0.0–0.2)

## 2019-08-28 LAB — POCT I-STAT EG7
Acid-Base Excess: 8 mmol/L — ABNORMAL HIGH (ref 0.0–2.0)
Bicarbonate: 38.7 mmol/L — ABNORMAL HIGH (ref 20.0–28.0)
Calcium, Ion: 1.22 mmol/L (ref 1.15–1.40)
HCT: 38 % (ref 36.0–46.0)
Hemoglobin: 12.9 g/dL (ref 12.0–15.0)
O2 Saturation: 94 %
Potassium: 4.4 mmol/L (ref 3.5–5.1)
Sodium: 142 mmol/L (ref 135–145)
TCO2: 41 mmol/L — ABNORMAL HIGH (ref 22–32)
pCO2, Ven: 90.8 mmHg (ref 44.0–60.0)
pH, Ven: 7.237 — ABNORMAL LOW (ref 7.250–7.430)
pO2, Ven: 86 mmHg — ABNORMAL HIGH (ref 32.0–45.0)

## 2019-08-28 LAB — BASIC METABOLIC PANEL
Anion gap: 8 (ref 5–15)
BUN: 10 mg/dL (ref 6–20)
CO2: 33 mmol/L — ABNORMAL HIGH (ref 22–32)
Calcium: 8.7 mg/dL — ABNORMAL LOW (ref 8.9–10.3)
Chloride: 100 mmol/L (ref 98–111)
Creatinine, Ser: 0.76 mg/dL (ref 0.44–1.00)
GFR calc Af Amer: >60 mL/min (ref >60)
GFR calc non Af Amer: >60 mL/min (ref >60)
Glucose, Bld: 101 mg/dL — ABNORMAL HIGH (ref 70–99)
Potassium: 4.3 mmol/L (ref 3.5–5.1)
Sodium: 141 mmol/L (ref 135–145)

## 2019-08-28 LAB — I-STAT BETA HCG BLOOD, ED (MC, WL, AP ONLY): I-stat hCG, quantitative: <5 m[IU]/mL (ref <5)

## 2019-08-28 LAB — RESPIRATORY PANEL BY RT PCR (FLU A&B, COVID)
Influenza A by PCR: NEGATIVE
Influenza B by PCR: NEGATIVE
SARS Coronavirus 2 by RT PCR: NEGATIVE

## 2019-08-28 LAB — RAPID URINE DRUG SCREEN, HOSP PERFORMED
Amphetamines: NOT DETECTED
Barbiturates: NOT DETECTED
Benzodiazepines: NOT DETECTED
Cocaine: NOT DETECTED
Opiates: NOT DETECTED
Tetrahydrocannabinol: NOT DETECTED

## 2019-08-28 LAB — D-DIMER, QUANTITATIVE: D-Dimer, Quant: 1.2 ug/mL-FEU — ABNORMAL HIGH (ref 0.00–0.50)

## 2019-08-28 LAB — BRAIN NATRIURETIC PEPTIDE: B Natriuretic Peptide: 185.1 pg/mL — ABNORMAL HIGH (ref 0.0–100.0)

## 2019-08-28 LAB — TROPONIN I (HIGH SENSITIVITY)
Troponin I (High Sensitivity): 12 ng/L (ref <18)
Troponin I (High Sensitivity): 11 ng/L (ref <18)

## 2019-08-28 MED ORDER — ENOXAPARIN SODIUM 40 MG/0.4ML ~~LOC~~ SOLN
40.0000 mg | SUBCUTANEOUS | Status: DC
  Administered 2019-08-28: 40 mg via SUBCUTANEOUS
  Filled 2019-08-28: qty 0.4

## 2019-08-28 MED ORDER — IPRATROPIUM-ALBUTEROL 0.5-2.5 (3) MG/3ML IN SOLN
3.0000 mL | RESPIRATORY_TRACT | Status: DC
  Administered 2019-08-28: 3 mL via RESPIRATORY_TRACT
  Filled 2019-08-28: qty 3

## 2019-08-28 MED ORDER — HYDROCHLOROTHIAZIDE 25 MG PO TABS
25.0000 mg | ORAL_TABLET | Freq: Every day | ORAL | Status: DC
  Administered 2019-08-28 – 2019-09-08 (×11): 25 mg via ORAL
  Filled 2019-08-28 (×12): qty 1

## 2019-08-28 MED ORDER — LISINOPRIL 10 MG PO TABS
10.0000 mg | ORAL_TABLET | Freq: Every day | ORAL | Status: DC
  Administered 2019-08-28 – 2019-09-08 (×11): 10 mg via ORAL
  Filled 2019-08-28 (×12): qty 1

## 2019-08-28 MED ORDER — FLUOXETINE HCL 20 MG PO CAPS
20.0000 mg | ORAL_CAPSULE | Freq: Every day | ORAL | Status: DC
  Administered 2019-08-28 – 2019-09-07 (×11): 20 mg via ORAL
  Filled 2019-08-28 (×11): qty 1

## 2019-08-28 MED ORDER — LIDOCAINE 5 % EX PTCH
1.0000 | MEDICATED_PATCH | CUTANEOUS | Status: DC
  Administered 2019-09-08: 1 via TRANSDERMAL
  Filled 2019-08-28 (×4): qty 1

## 2019-08-28 MED ORDER — ARIPIPRAZOLE 5 MG PO TABS
5.0000 mg | ORAL_TABLET | Freq: Every day | ORAL | Status: DC
  Administered 2019-08-28 – 2019-09-07 (×11): 5 mg via ORAL
  Filled 2019-08-28 (×11): qty 1

## 2019-08-28 MED ORDER — IOHEXOL 350 MG/ML SOLN
100.0000 mL | Freq: Once | INTRAVENOUS | Status: AC | PRN
  Administered 2019-08-28: 100 mL via INTRAVENOUS

## 2019-08-28 MED ORDER — HYDROXYZINE HCL 10 MG PO TABS: 10.0000 mg | ORAL_TABLET | Freq: Three times a day (TID) | ORAL | Status: DC | PRN

## 2019-08-28 MED ORDER — ALBUTEROL SULFATE HFA 108 (90 BASE) MCG/ACT IN AERS
2.0000 | INHALATION_SPRAY | Freq: Once | RESPIRATORY_TRACT | Status: AC
  Administered 2019-08-28: 2 via RESPIRATORY_TRACT
  Filled 2019-08-28: qty 6.7

## 2019-08-28 NOTE — ED Triage Notes (Signed)
To ED via [facility] with c/o increasing shortness of breath since last night-- pt was 88% on room air-- EMS placed on O2 at 3l/m/Ferry sats up to 98%- pt is short of breath with  Movement- unable to speak in sentences.

## 2019-08-28 NOTE — ED Provider Notes (Signed)
[facility] EMERGENCY DEPARTMENT Provider Note   CSN: [id number] Arrival date & time: 08/28/19  0857     History Chief Complaint  Patient presents with  . Shortness of Breath    Breanna Moore is a 44 y.o. female.  The history is provided by the patient and medical records. No language interpreter was used.  Shortness of Breath    44 year old obese female with history of hypertension, depression, tobacco use, schizophrenia brought here via EMS from home for evaluation of shortness of breath.  Patient report yesterday she went out with a friend, when as she was going home she developed acute onset of shortness of breath.  Shortness of breath worse with exertion, but has been persistent, with some associated dry cough, and pleuritic chest pain.  Symptom persisted throughout the night prompting this ER visit.  She denies any associated fever or chills no runny nose sneezing sore throat nausea vomiting diaphoresis abdominal pain or rash.  No history of asthma or COPD.  She has had both of her Covid vaccination in March.  She denies any significant cardiac history.  Patient did received supplemental oxygen via EMS with improvement of her symptoms.  Patient also was given aspirin prior to arrival.  Past Medical History:  Diagnosis Date  . Depression   . Depression   . Hypertension   . Obesity     Patient Active Problem List   Diagnosis Date Noted  . Shortness of breath on exertion 08/11/2017  . Essential hypertension 08/11/2017  . Morbid obesity (HCC) 08/11/2017  . Cigarette smoker 08/11/2017  . Schizo-affective psychosis (HCC) 05/25/2014    Past Surgical History:  Procedure Laterality Date  . CESAREAN SECTION       OB History   No obstetric history on file.     Family History  Problem Relation Age of Onset  . Schizophrenia Mother     Social History   Tobacco Use  . Smoking status: Current Every Day Smoker    Types: Cigarettes  . Smokeless  tobacco: Never Used  Substance Use Topics  . Alcohol use: Yes    Alcohol/week: 0.0 standard drinks    Comment: occasional  . Drug use: No    Home Medications Prior to Admission medications   Medication Sig Start Date End Date Taking? Authorizing Provider  albuterol (PROVENTIL HFA;VENTOLIN HFA) 108 (90 Base) MCG/ACT inhaler Inhale 1-2 puffs into the lungs every 6 (six) hours as needed for wheezing or shortness of breath. Patient not taking: Reported on 10/10/2018 06/30/17   Liberty Handy, PA-C  ARIPiprazole (ABILIFY) 5 MG tablet Take 5 mg by mouth at bedtime. 08/28/18   [provider]  benzonatate (TESSALON) 100 MG capsule Take 1 capsule (100 mg total) by mouth 3 (three) times daily as needed. Patient not taking: Reported on 10/10/2018 02/15/18   Petrucelli, Pleas Koch, PA-C  FLUoxetine (PROZAC) 20 MG capsule Take 20 mg by mouth at bedtime.  08/28/18   [provider]  fluticasone (FLONASE) 50 MCG/ACT nasal spray Place 1 spray into both nostrils daily. Patient not taking: Reported on 10/10/2018 02/15/18   Petrucelli, Pleas Koch, PA-C  hydrochlorothiazide (HYDRODIURIL) 25 MG tablet Take 25 mg by mouth daily. 06/26/18   [provider]  hydrOXYzine (ATARAX/VISTARIL) 10 MG tablet Take 10 mg by mouth 3 (three) times daily as needed for anxiety.  08/28/18   [provider]  lisinopril (ZESTRIL) 10 MG tablet Take 10 mg by mouth daily. 06/26/18   [provider]  methocarbamol (ROBAXIN) 500 MG tablet Take 1 tablet (500 mg total) by mouth every 8 (eight) hours as needed. Patient not taking: Reported on 10/10/2018 02/15/18   Petrucelli, Lelon Mast R, PA-C    Allergies    Hydrocodone-acetaminophen, Ketorolac, Paxil [paroxetine hcl], and Penicillins  Review of Systems   Review of Systems  Respiratory: Positive for shortness of breath.   All other systems reviewed and are negative.   Physical Exam Updated Vital Signs BP (!) 146/72 (BP Location: Right Arm)    Pulse 95   Temp 99 F (37.2 C) (Oral)   Resp (!) 26   Ht 5\' 5"  (1.651 m)   Wt (!) 172.4 kg   SpO2 (S) (!) 88% Comment: 98% on 2L/m/[place]  BMI 63.24 kg/m   Physical Exam Vitals and nursing note reviewed.  Constitutional:      General: She is not in acute distress.    Appearance: She is well-developed.     Comments: Morbidly obese female appears to be in mild respiratory discomfort.  HENT:     Head: Atraumatic.  Eyes:     Conjunctiva/sclera: Conjunctivae normal.  Cardiovascular:     Rate and Rhythm: Normal rate and regular rhythm.  Pulmonary:     Effort: Tachypnea present. No respiratory distress.     Breath sounds: No stridor. Decreased breath sounds and wheezing (Minimal wheezes heard on expiratory and inspiratory) present. No rhonchi.  Chest:     Chest wall: No tenderness.  Abdominal:     Tenderness: There is no abdominal tenderness.  Musculoskeletal:     Cervical back: Neck supple.     Right lower leg: No edema.     Left lower leg: No edema.  Skin:    Findings: No rash.  Neurological:     Mental Status: She is alert and oriented to person, place, and time.  Psychiatric:        Mood and Affect: Mood normal.     ED Results / Procedures / Treatments   Labs (all labs ordered are listed, but only abnormal results are displayed) Labs Reviewed  BASIC METABOLIC PANEL - Abnormal; Notable for the following components:      Result Value   CO2 33 (*)    Glucose, Bld 101 (*)    Calcium 8.7 (*)    All other components within normal limits  CBC WITH DIFFERENTIAL/PLATELET - Abnormal; Notable for the following components:   RBC 5.32 (*)    Hemoglobin 9.6 (*)    MCV 71.4 (*)    MCH 18.0 (*)    MCHC 25.3 (*)    RDW 21.6 (*)    nRBC 0.3 (*)    All other components within normal limits  BRAIN NATRIURETIC PEPTIDE - Abnormal; Notable for the following components:   B Natriuretic Peptide 185.1 (*)    All other components within normal limits  D-DIMER, QUANTITATIVE (NOT AT [facility])  - Abnormal; Notable for the following components:   D-Dimer, Quant 1.20 (*)    All other components within normal limits  RESPIRATORY PANEL BY RT PCR (FLU A&B, COVID)  BLOOD GAS, VENOUS  I-STAT BETA HCG BLOOD, ED ([facility], [facility], [facility] ONLY)  TROPONIN I (HIGH SENSITIVITY)  TROPONIN I (HIGH SENSITIVITY)    EKG EKG Interpretation  Date/Time:  Saturday Aug 28 2019 09:05:01 EDT Ventricular Rate:  97 PR Interval:    QRS Duration: 85 QT Interval:  364 QTC Calculation: 463 R Axis:   125 Text Interpretation: Sinus rhythm Probable left atrial  enlargement Left posterior fascicular block Low voltage, extremity and precordial leads Consider anterior infarct When comapred to prior, faster rate. No STEMI Confirmed by Theda Belfast ([id number]) on 08/28/2019 11:40:20 AM   Radiology CT Angio Chest PE W and/or Wo Contrast  Result Date: 08/28/2019 CLINICAL DATA:  Increased shortness of breath since last night, elevated D-dimer EXAM: CT ANGIOGRAPHY CHEST WITH CONTRAST TECHNIQUE: Multidetector CT imaging of the chest was performed using the standard protocol during bolus administration of intravenous contrast. Multiplanar CT image reconstructions and MIPs were obtained to evaluate the vascular anatomy. CONTRAST:  OMNIPAQUE IOHEXOL 350 MG/ML SOLN IV COMPARISON:  None FINDINGS: Cardiovascular: Aneurysmal dilatation ascending thoracic aorta 4.4 cm transverse image 37. No aortic dissection. Heart unremarkable. No pericardial effusion. Suboptimal opacification of pulmonary arterial system due to timing with additional degradation of image quality related to body habitus. No large or central pulmonary emboli are identified. Unable to exclude small and peripheral emboli by this exam. Mediastinum/Nodes: Esophagus unremarkable. Enlargement of the thyroid lobes asymmetrically greater on LEFT, extending retrosternal, with slight mass effect upon the trachea and mild LEFT-to-RIGHT midline shift. No discrete thyroid mass seen.  Multiple normal sized axillary nodes. No definite thoracic adenopathy. Lungs/Pleura: Subsegmental atelectasis BILATERAL lower lobes and RIGHT middle lobe. No infiltrate, pleural effusion or pneumothorax. Upper Abdomen: Mass at medial aspect upper pole LEFT kidney 3.1 x 2.6 cm, incompletely visualized but likely cyst. Musculoskeletal: Unremarkable Review of the MIP images confirms the above findings. IMPRESSION: No definite evidence of large or pulmonary embolism identified on exam limited by suboptimal opacification of the pulmonary arteries and beam hardening artifacts as discussed above. Subsegmental atelectasis in both lungs. Probable upper pole LEFT renal cyst. Aneurysmal dilatation ascending thoracic aorta 4.4 cm diameter, recommendation below. Recommend annual imaging followup by CTA or MRA. This recommendation follows 2010 ACCF/AHA/AATS/ACR/ASA/SCA/SCAI/SIR/STS/SVM Guidelines for the Diagnosis and Management of Patients with Thoracic Aortic Disease. Circulation. 2010; 121: U045-W098. Aortic aneurysm NOS (ICD10-I71.9) Electronically Signed   By: Ulyses Southward M.D.   On: 08/28/2019 14:16   DG Chest Port 1 View  Result Date: 08/28/2019 CLINICAL DATA:  Dyspnea EXAM: PORTABLE CHEST 1 VIEW COMPARISON:  02/14/2018 chest radiograph. FINDINGS: New mild enlargement of the cardiopericardial silhouette. Otherwise normal mediastinal contour. No pneumothorax. No pleural effusion. Cephalization of the pulmonary vasculature without overt pulmonary edema. No acute consolidative airspace disease. IMPRESSION: New mild enlargement of the cardiopericardial silhouette, cannot exclude pericardial effusion. No overt pulmonary edema. Electronically Signed   By: Delbert Phenix M.D.   On: 08/28/2019 11:08    Procedures Procedures (including critical care time)  Medications Ordered in ED Medications  albuterol (VENTOLIN HFA) 108 (90 Base) MCG/ACT inhaler 2 puff (2 puffs Inhalation Given 08/28/19 1012)  iohexol (OMNIPAQUE) 350  MG/ML injection 100 mL (100 mLs Intravenous Contrast Given 08/28/19 1309)    ED Course  I have reviewed the triage vital signs and the nursing notes.  Pertinent labs & imaging results that were available during my care of the patient were reviewed by me and considered in my medical decision making (see chart for details).    MDM Rules/Calculators/A&P                      BP (!) 146/96   Pulse 94   Temp 99 F (37.2 C) (Oral)   Resp 16   Ht 5\' 5"  (1.651 m)   Wt (!) 172.4 kg   SpO2 95%   BMI 63.24 kg/m   Final Clinical Impression(s) /  ED Diagnoses Final diagnoses:  Hypoxia    Rx / DC Orders ED Discharge Orders    None     9:27 AM Patient here with acute onset of shortness of breath and pleuritic chest pain that started since yesterday.  She was found to have an oxygen of 88% on room air improved to 98% after 3 L of supplemental oxygen.  On exam she does have some faint wheezes but no known history of COPD or asthma.  She endorsed a dry cough.  She has had both of her Covid vaccination and denies any other COVID-19 symptoms.  Given her obesity, will obtain D-dimer to rule out PE.  Work-up initiated.  Will assess for cardiac etiology as well. Care discussed with DR. Tegeler.   Patient's bicarb is elevated at 33.  COVID-19 test is negative.  Hemoglobin is 9.6.  Elevated BNP of 185.  Mild elevated D-dimer of 1.2.  Initial troponin within normal limit.  Initial chest x-ray shows mild enlargement of cardiopericardial silhouette, cannot exclude pericardial effusion.  Chest CT angiogram show no definitive evidence of large or pulmonary embolism however exam is limited due to artifacts.  Subsegmental atelectasis in both lungs.  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  I did discuss this with patient.  Patient also reports she has been evaluated for obstructive sleep apnea.  Patient does endorse  generalized fatigue.  And when ambulating, O2 sats drops down to 85% on room air.  In setting of hypoxia with new oxygen requirement, will consult for admission. Delta trop have not resulted yet.   Pt sign out to oncoming provider who will consult for admission pending blood gas.   Breanna Moore was evaluated in Emergency Department on 08/28/2019 for the symptoms described in the history of present illness. She was evaluated in the context of the global COVID-19 pandemic, which necessitated consideration that the patient might be at risk for infection with the SARS-CoV-2 virus that causes COVID-19. Institutional protocols and algorithms that pertain to the evaluation of patients at risk for COVID-19 are in a state of rapid change based on information released by regulatory bodies including the CDC and federal and state organizations. These policies and algorithms were followed during the patient's care in the ED.    Domenic Moras, PA-C 08/28/19 [place], Gwenyth Allegra, MD 08/28/19 2007
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
## 2019-08-28 NOTE — ED Provider Notes (Addendum)
Care handoff received from Fayrene Helper PA-C at shift change please see his note for full details.  " 9:27 AM Patient here with acute onset of shortness of breath and pleuritic chest pain that started since yesterday.  She was found to have an oxygen of 88% on room air improved to 98% after 3 L of supplemental oxygen.  On exam she does have some faint wheezes but no known history of COPD or asthma.  She endorsed a dry cough.  She has had both of her Covid vaccination and denies any other COVID-19 symptoms.  Given her obesity, will obtain D-dimer to rule out PE.  Work-up initiated.  Will assess for cardiac etiology as well. Care discussed with DR. Tegeler.   Patient's bicarb is elevated at 33.  COVID-19 test is negative.  Hemoglobin is 9.6.  Elevated BNP of 185.  Mild elevated D-dimer of 1.2.  Initial troponin within normal limit.  Initial chest x-ray shows mild enlargement of cardiopericardial silhouette, cannot exclude pericardial effusion.  Chest CT angiogram show no definitive evidence of large or pulmonary embolism however exam is limited due to artifacts.  Subsegmental atelectasis in both lungs.  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  I did discuss this with patient.  Patient also reports she has been evaluated for obstructive sleep apnea.  Patient does endorse generalized fatigue.  And when ambulating, O2 sats drops down to 85% on room air.  In setting of hypoxia with new oxygen requirement, will consult for admission. Delta trop have not resulted yet.   Pt sign out to oncoming provider who will consult for admission pending blood gas.  "  Physical Exam  BP (!) 146/96   Pulse 94   Temp 99 F (37.2 C) (Oral)   Resp 16   Ht 5\' 5"  (1.651 m)   Wt (!) 172.4 kg   SpO2 95%   BMI 63.24 kg/m   Physical Exam Constitutional:      General: She is not in acute distress.    Appearance: Normal  appearance. She is well-developed. She is obese. She is not toxic-appearing or diaphoretic.  HENT:     Head: Normocephalic and atraumatic.     Right Ear: External ear normal.     Left Ear: External ear normal.     Nose: Nose normal.  Eyes:     General: Vision grossly intact. Gaze aligned appropriately.     Pupils: Pupils are equal, round, and reactive to light.  Neck:     Trachea: Trachea and phonation normal. No tracheal deviation.  Pulmonary:     Effort: Pulmonary effort is normal. No respiratory distress.  Abdominal:     General: There is no distension.     Palpations: Abdomen is soft.     Tenderness: There is no abdominal tenderness. There is no guarding or rebound.  Musculoskeletal:        General: Normal range of motion.     Cervical back: Normal range of motion.  Skin:    General: Skin is warm and dry.  Neurological:     Mental Status: She is alert.     GCS: GCS eye subscore is 4. GCS verbal subscore is 5. GCS motor subscore is 6.     Comments: Speech is clear and goal oriented, follows commandst  Psychiatric:        Behavior: Behavior normal.     ED Course/Procedures      Breanna Moore was evaluated in Emergency Department on 08/28/2019 for the symptoms described in the history of present illness. She was evaluated in the context of the global COVID-19 pandemic, which necessitated consideration that the patient might be at risk for infection with the SARS-CoV-2 virus that causes COVID-19. Institutional protocols and algorithms that pertain to the evaluation of patients at risk for COVID-19 are in a state of rapid change based on information released by regulatory bodies including the CDC and federal and state organizations. These policies and algorithms were followed during the patient's care in the ED.  Procedures  MDM  Second troponin is 11, the initial was 12.  ED 7 shows acidosis 7.237, increased PCO2 90.8, PO2 86, bicarb 38.7, acid base 8.0, total CO2 41.  Patient  reevaluated SPO2 stable on 3 L nasal cannula.  Patient and family are agreeable for admission at this time.  Patient is in no acute distress.  Consult placed to hospitalist service for admission. ------------- 4:20 PM: Discussed case with Dr. Posey Pronto from resident medicine service, patient has been accepted for admission.  Informed by RN that SPO2 dropped into the 80s while patient was sleeping in bed.  This improved spontaneously after waking up the patient.  Patient may benefit from CPAP, this was ordered.   Note: Portions of this report may have been transcribed using voice recognition software. Every effort was made to ensure accuracy; however, inadvertent computerized transcription errors may still be present.   Breanna Boston, PA-C 08/28/19 1621    Breanna Boston, PA-C 08/28/19 1622    Virgel Manifold, MD 08/29/19 1526

## 2019-08-28 NOTE — ED Notes (Signed)
Assisted patient to sat up in bed and removed supplemental 02. Noted Sp02 dropped to 90% patient ambulated to the bathroom with no gait assistance; ambulated well. Sp02 decreased to low 80s and improved as patient sat back down. Patient endorsed some shortness of breath with ambulation, gait remained steady, however patient c/o dizziness as well.  Assisted patient back to bed and placed on supplemental [place] at 2L; Sp02 improved to 95%

## 2019-08-28 NOTE — Progress Notes (Signed)
Patient placed on bipap with 6 liters of oxygen.  HR 96 and Saturation 99%.  No respiratory distress noted.  No wheezing heard.  Will clarify Q2H Duoneb HHN.

## 2019-08-29 ENCOUNTER — Observation Stay (HOSPITAL_COMMUNITY): Payer: BLUE CROSS/BLUE SHIELD

## 2019-08-29 ENCOUNTER — Encounter (HOSPITAL_COMMUNITY): Payer: Self-pay | Admitting: Family Medicine

## 2019-08-29 DIAGNOSIS — R0902 Hypoxemia: Secondary | ICD-10-CM | POA: Diagnosis not present

## 2019-08-29 DIAGNOSIS — R062 Wheezing: Secondary | ICD-10-CM | POA: Diagnosis present

## 2019-08-29 DIAGNOSIS — E042 Nontoxic multinodular goiter: Secondary | ICD-10-CM | POA: Diagnosis present

## 2019-08-29 DIAGNOSIS — E662 Morbid (severe) obesity with alveolar hypoventilation: Secondary | ICD-10-CM | POA: Diagnosis present

## 2019-08-29 DIAGNOSIS — J9811 Atelectasis: Secondary | ICD-10-CM | POA: Diagnosis present

## 2019-08-29 DIAGNOSIS — Z6841 Body Mass Index (BMI) 40.0 and over, adult: Secondary | ICD-10-CM

## 2019-08-29 DIAGNOSIS — F329 Major depressive disorder, single episode, unspecified: Secondary | ICD-10-CM | POA: Diagnosis present

## 2019-08-29 DIAGNOSIS — D509 Iron deficiency anemia, unspecified: Secondary | ICD-10-CM | POA: Diagnosis present

## 2019-08-29 DIAGNOSIS — I119 Hypertensive heart disease without heart failure: Secondary | ICD-10-CM | POA: Diagnosis present

## 2019-08-29 DIAGNOSIS — I10 Essential (primary) hypertension: Secondary | ICD-10-CM | POA: Diagnosis not present

## 2019-08-29 DIAGNOSIS — J9621 Acute and chronic respiratory failure with hypoxia: Secondary | ICD-10-CM

## 2019-08-29 DIAGNOSIS — Z818 Family history of other mental and behavioral disorders: Secondary | ICD-10-CM | POA: Diagnosis not present

## 2019-08-29 DIAGNOSIS — G4734 Idiopathic sleep related nonobstructive alveolar hypoventilation: Secondary | ICD-10-CM | POA: Diagnosis present

## 2019-08-29 DIAGNOSIS — J9622 Acute and chronic respiratory failure with hypercapnia: Secondary | ICD-10-CM | POA: Diagnosis present

## 2019-08-29 DIAGNOSIS — N393 Stress incontinence (female) (male): Secondary | ICD-10-CM | POA: Diagnosis present

## 2019-08-29 DIAGNOSIS — F17210 Nicotine dependence, cigarettes, uncomplicated: Secondary | ICD-10-CM | POA: Diagnosis present

## 2019-08-29 DIAGNOSIS — Z880 Allergy status to penicillin: Secondary | ICD-10-CM | POA: Diagnosis not present

## 2019-08-29 DIAGNOSIS — F259 Schizoaffective disorder, unspecified: Secondary | ICD-10-CM | POA: Diagnosis present

## 2019-08-29 DIAGNOSIS — Z20822 Contact with and (suspected) exposure to covid-19: Secondary | ICD-10-CM | POA: Diagnosis present

## 2019-08-29 DIAGNOSIS — I517 Cardiomegaly: Secondary | ICD-10-CM

## 2019-08-29 DIAGNOSIS — Z885 Allergy status to narcotic agent status: Secondary | ICD-10-CM | POA: Diagnosis not present

## 2019-08-29 DIAGNOSIS — Z888 Allergy status to other drugs, medicaments and biological substances status: Secondary | ICD-10-CM | POA: Diagnosis not present

## 2019-08-29 DIAGNOSIS — E063 Autoimmune thyroiditis: Secondary | ICD-10-CM | POA: Diagnosis present

## 2019-08-29 DIAGNOSIS — J449 Chronic obstructive pulmonary disease, unspecified: Secondary | ICD-10-CM | POA: Diagnosis present

## 2019-08-29 DIAGNOSIS — E041 Nontoxic single thyroid nodule: Secondary | ICD-10-CM | POA: Diagnosis not present

## 2019-08-29 DIAGNOSIS — E872 Acidosis: Secondary | ICD-10-CM | POA: Diagnosis present

## 2019-08-29 DIAGNOSIS — I712 Thoracic aortic aneurysm, without rupture: Secondary | ICD-10-CM | POA: Diagnosis present

## 2019-08-29 DIAGNOSIS — R0600 Dyspnea, unspecified: Secondary | ICD-10-CM

## 2019-08-29 DIAGNOSIS — I7121 Aneurysm of the ascending aorta, without rupture: Secondary | ICD-10-CM | POA: Diagnosis present

## 2019-08-29 DIAGNOSIS — D72829 Elevated white blood cell count, unspecified: Secondary | ICD-10-CM | POA: Diagnosis not present

## 2019-08-29 DIAGNOSIS — R0789 Other chest pain: Secondary | ICD-10-CM

## 2019-08-29 DIAGNOSIS — G4710 Hypersomnia, unspecified: Secondary | ICD-10-CM | POA: Diagnosis present

## 2019-08-29 DIAGNOSIS — Z72820 Sleep deprivation: Secondary | ICD-10-CM | POA: Diagnosis not present

## 2019-08-29 DIAGNOSIS — E049 Nontoxic goiter, unspecified: Secondary | ICD-10-CM | POA: Diagnosis present

## 2019-08-29 DIAGNOSIS — R0602 Shortness of breath: Secondary | ICD-10-CM | POA: Diagnosis not present

## 2019-08-29 HISTORY — DX: Nontoxic multinodular goiter: E04.2

## 2019-08-29 LAB — BLOOD GAS, ARTERIAL
Acid-Base Excess: 12.2 mmol/L — ABNORMAL HIGH (ref 0.0–2.0)
Bicarbonate: 38.6 mmol/L — ABNORMAL HIGH (ref 20.0–28.0)
FIO2: 44
O2 Saturation: 92.6 %
Patient temperature: 36.8
pCO2 arterial: 76.7 mmHg (ref 32.0–48.0)
pH, Arterial: 7.321 — ABNORMAL LOW (ref 7.350–7.450)
pO2, Arterial: 69.4 mmHg — ABNORMAL LOW (ref 83.0–108.0)

## 2019-08-29 LAB — ECHOCARDIOGRAM COMPLETE
Height: 66 in
Weight: 6409.21 oz

## 2019-08-29 LAB — CBC
HCT: 39.3 % (ref 36.0–46.0)
Hemoglobin: 9.8 g/dL — ABNORMAL LOW (ref 12.0–15.0)
MCH: 17.8 pg — ABNORMAL LOW (ref 26.0–34.0)
MCHC: 24.9 g/dL — ABNORMAL LOW (ref 30.0–36.0)
MCV: 71.2 fL — ABNORMAL LOW (ref 80.0–100.0)
Platelets: 319 10*3/uL (ref 150–400)
RBC: 5.52 MIL/uL — ABNORMAL HIGH (ref 3.87–5.11)
RDW: 21.9 % — ABNORMAL HIGH (ref 11.5–15.5)
WBC: 8.7 10*3/uL (ref 4.0–10.5)
nRBC: 0.3 % — ABNORMAL HIGH (ref 0.0–0.2)

## 2019-08-29 LAB — BASIC METABOLIC PANEL
Anion gap: 8 (ref 5–15)
BUN: 9 mg/dL (ref 6–20)
CO2: 32 mmol/L (ref 22–32)
Calcium: 8.6 mg/dL — ABNORMAL LOW (ref 8.9–10.3)
Chloride: 97 mmol/L — ABNORMAL LOW (ref 98–111)
Creatinine, Ser: 0.85 mg/dL (ref 0.44–1.00)
GFR calc Af Amer: >60 mL/min (ref >60)
GFR calc non Af Amer: >60 mL/min (ref >60)
Glucose, Bld: 94 mg/dL (ref 70–99)
Potassium: 4.6 mmol/L (ref 3.5–5.1)
Sodium: 137 mmol/L (ref 135–145)

## 2019-08-29 LAB — IRON AND TIBC
Iron: 34 ug/dL (ref 28–170)
Saturation Ratios: 7 % — ABNORMAL LOW (ref 10.4–31.8)
TIBC: 463 ug/dL — ABNORMAL HIGH (ref 250–450)
UIBC: 429 ug/dL

## 2019-08-29 LAB — TSH: TSH: 1.233 u[IU]/mL (ref 0.350–4.500)

## 2019-08-29 LAB — HIV ANTIBODY (ROUTINE TESTING W REFLEX): HIV Screen 4th Generation wRfx: NONREACTIVE

## 2019-08-29 LAB — VITAMIN B12: Vitamin B-12: 310 pg/mL (ref 180–914)

## 2019-08-29 LAB — FERRITIN: Ferritin: 4 ng/mL — ABNORMAL LOW (ref 11–307)

## 2019-08-29 MED ORDER — CHLORHEXIDINE GLUCONATE 0.12 % MT SOLN
15.0000 mL | Freq: Two times a day (BID) | OROMUCOSAL | Status: DC
  Administered 2019-08-29 – 2019-09-05 (×13): 15 mL via OROMUCOSAL
  Filled 2019-08-29 (×14): qty 15

## 2019-08-29 MED ORDER — PREDNISONE 20 MG PO TABS
40.0000 mg | ORAL_TABLET | Freq: Every day | ORAL | Status: AC
  Administered 2019-08-29 – 2019-09-02 (×5): 40 mg via ORAL
  Filled 2019-08-29 (×5): qty 2

## 2019-08-29 MED ORDER — ENOXAPARIN SODIUM 100 MG/ML ~~LOC~~ SOLN
90.0000 mg | SUBCUTANEOUS | Status: DC
  Administered 2019-08-29 – 2019-09-07 (×9): 90 mg via SUBCUTANEOUS
  Filled 2019-08-29 (×11): qty 1

## 2019-08-29 MED ORDER — PERFLUTREN LIPID MICROSPHERE
1.0000 mL | INTRAVENOUS | Status: AC | PRN
  Administered 2019-08-29: 3 mL via INTRAVENOUS
  Filled 2019-08-29: qty 10

## 2019-08-29 MED ORDER — IPRATROPIUM-ALBUTEROL 0.5-2.5 (3) MG/3ML IN SOLN
3.0000 mL | Freq: Four times a day (QID) | RESPIRATORY_TRACT | Status: DC
  Administered 2019-08-29: 3 mL via RESPIRATORY_TRACT
  Filled 2019-08-29: qty 3

## 2019-08-29 MED ORDER — IPRATROPIUM-ALBUTEROL 0.5-2.5 (3) MG/3ML IN SOLN: 3.0000 mL | Freq: Four times a day (QID) | RESPIRATORY_TRACT | Status: DC | PRN

## 2019-08-29 MED ORDER — IPRATROPIUM-ALBUTEROL 0.5-2.5 (3) MG/3ML IN SOLN
3.0000 mL | Freq: Two times a day (BID) | RESPIRATORY_TRACT | Status: DC
  Administered 2019-08-29 – 2019-09-01 (×7): 3 mL via RESPIRATORY_TRACT
  Filled 2019-08-29 (×7): qty 3

## 2019-08-29 MED ORDER — ORAL CARE MOUTH RINSE
15.0000 mL | Freq: Two times a day (BID) | OROMUCOSAL | Status: DC
  Administered 2019-08-29 – 2019-09-03 (×11): 15 mL via OROMUCOSAL

## 2019-08-29 NOTE — Progress Notes (Signed)
Dr. Cherlynn Polo came to bedside to see patient. Awaiting on ABG results

## 2019-08-29 NOTE — Progress Notes (Addendum)
FPTS Interim Progress Note  S: Visited patient d/t somnolence concern by RN christina and RT.  Sister at bedside and feels as if she is more sleepy than normal and had a hard time breathing earlier. The patient says she feels better now with the CPAP machine on.   O: BP 138/82   Pulse 94   Temp 98 F (36.7 C) (Oral)   Resp 20   Ht 5\' 6"  (1.676 m)   Wt (!) 181.7 kg   SpO2 96%   BMI 64.65 kg/m   General: Appears sleepy but easily arousable intermittently falling asleep, no acute distress. Family at bedside.  A/P: Hypersomnolence 2/2 respiratory acidosis  Likely due to respiratory derangements. ABG: pH 7.3, pCO2 76, pO2 69.4 bicarb 38.  -RT consulted will appreciate recommendations -Continuous CPAP/V60 BiPAP prn  -Monitor for improvement with CPAP/BiPAP -Consider other causes if patient not improving with above treatment -Consider PCCM if patient showing decline with the above treatement  Autry-Lott, , DO 08/29/2019, 3:26 PM PGY-1, [facility] Family Medicine Service pager [phone]

## 2019-08-29 NOTE — Progress Notes (Signed)
Patient keeps saying she "feels so drained" this afternoon. Sats have started to go into the upper 80s to low 90s on 5l . I noticed patient has been sleeping a bit more. I nottified Dr. Cherlynn Polo and RT [facility] and Dr. Cherlynn Polo stated it was ok to place patient back on bipap. Sats up to 92 percent with 6L bled into Bipap machine. I asked Chelsea to come and assess machine and function. She will be up shortly

## 2019-08-29 NOTE — Progress Notes (Signed)
  Echocardiogram 2D Echocardiogram has been performed.  Gerda Diss 08/29/2019, 10:23 AM

## 2019-08-29 NOTE — Hospital Course (Addendum)
Breanna Moore is a 44 y.o. female who presented with dyspnea. PMH is significant for HTN, OSA, obesity and major depression.   Hypercarbic respiratory failure likely 2/2 OSA, OHS Presented with acute dyspnea x1 day. ED work up [employer]: pH 7.2, PCO2 90, PO2 86, bicarb 38, BNP 185, troponin 12, 11 and Hb 9.6 CXR: mild enlargement of the cardiopericardial silhouette, WBC wnl and no fevers. Echo: EF 65-70% Moderate dilatation of the right ventricle and right atrium with RV strain suspicious for a pulmonary embolism or other lung pathology. Grade II diastolic dysfunction. CTA: No definite evidence of PE but limited by suboptimal opacification of the pulmonary arteries and beam hardening artifacts. She was continued on duonebs and started on prednisone for a 5 day course. CPAP was used nightly during hospitalization and patient's respiratory status improved***   Enlarged thyroid CTA: Enlargement of the thyroid lobes asymmetrically greater on LEFT, extending retrosternal, with slight mass effect upon the trachea and mild LEFT-to-RIGHT midline shift. No discrete thyroid mass seen. Thyroid US: 2 cm right superior thyroid nodule meets criteria for 1 year follow-up ultrasound. 4.8 cm solid and cystic left inferior thyroid nodule (nodule 2) technically does not meet criteria for follow-up. However, given its size, 1 year follow-up ultrasound is felt to be appropriate at the time of follow-up of the other nodule. TSH 1.233   Follow up recs:  Needs CPAP nightly, keep appt for sleep study Needs 1 year follow up for thyroid ultrasound findings  Needs 1 year follow up with vascular for 4.4 aortic aneurysm Ambulatory referral to community weight and wellness S/p IV ferraheme x1 for iron deficiency anemia, will need o/p follow up for daily oral supplementation

## 2019-08-29 NOTE — Progress Notes (Signed)
RT and RN assessed patient for need of V60 BiPAP. ABG results are compensated at this time. Patient continued on nasal cannula. Will place patient on BiPAP upon request or when sleepy. RT will continue to monitor.
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
## 2019-08-29 NOTE — Progress Notes (Signed)
Bipap in use.   

## 2019-08-30 ENCOUNTER — Encounter (HOSPITAL_COMMUNITY): Payer: Self-pay | Admitting: Family Medicine

## 2019-08-30 DIAGNOSIS — J9811 Atelectasis: Secondary | ICD-10-CM | POA: Diagnosis not present

## 2019-08-30 DIAGNOSIS — Z6841 Body Mass Index (BMI) 40.0 and over, adult: Secondary | ICD-10-CM

## 2019-08-30 DIAGNOSIS — R0789 Other chest pain: Secondary | ICD-10-CM | POA: Diagnosis not present

## 2019-08-30 DIAGNOSIS — J9621 Acute and chronic respiratory failure with hypoxia: Secondary | ICD-10-CM | POA: Diagnosis not present

## 2019-08-30 LAB — CBC
HCT: 36.5 % (ref 36.0–46.0)
Hemoglobin: 9.1 g/dL — ABNORMAL LOW (ref 12.0–15.0)
MCH: 17.9 pg — ABNORMAL LOW (ref 26.0–34.0)
MCHC: 24.9 g/dL — ABNORMAL LOW (ref 30.0–36.0)
MCV: 71.7 fL — ABNORMAL LOW (ref 80.0–100.0)
Platelets: 386 10*3/uL (ref 150–400)
RBC: 5.09 MIL/uL (ref 3.87–5.11)
RDW: 21.4 % — ABNORMAL HIGH (ref 11.5–15.5)
WBC: 10.3 10*3/uL (ref 4.0–10.5)
nRBC: 0 % (ref 0.0–0.2)

## 2019-08-30 LAB — BASIC METABOLIC PANEL
Anion gap: 7 (ref 5–15)
BUN: 10 mg/dL (ref 6–20)
CO2: 39 mmol/L — ABNORMAL HIGH (ref 22–32)
Calcium: 8.9 mg/dL (ref 8.9–10.3)
Chloride: 94 mmol/L — ABNORMAL LOW (ref 98–111)
Creatinine, Ser: 0.85 mg/dL (ref 0.44–1.00)
GFR calc Af Amer: >60 mL/min (ref >60)
GFR calc non Af Amer: >60 mL/min (ref >60)
Glucose, Bld: 118 mg/dL — ABNORMAL HIGH (ref 70–99)
Potassium: 4.3 mmol/L (ref 3.5–5.1)
Sodium: 140 mmol/L (ref 135–145)

## 2019-08-30 LAB — GLUCOSE, CAPILLARY: Glucose-Capillary: 133 mg/dL — ABNORMAL HIGH (ref 70–99)

## 2019-08-30 MED ORDER — ACETAMINOPHEN 325 MG PO TABS
650.0000 mg | ORAL_TABLET | Freq: Four times a day (QID) | ORAL | Status: DC | PRN
  Administered 2019-08-30: 650 mg via ORAL
  Filled 2019-08-30: qty 2

## 2019-08-30 NOTE — Plan of Care (Signed)
  Problem: Clinical Measurements: Goal: Diagnostic test results will improve Outcome: Progressing   

## 2019-08-30 NOTE — Progress Notes (Signed)
Occupational Therapy Evaluation Patient Details Name: Breanna Moore MRN: [id number] DOB: 1975-05-17 Today's Date: 08/30/2019    History of Present Illness Breanna Moore is a 44 y.o. female presenting with dyspnea. PMH is significant for HTN, OSA, obesity and major depression.   Clinical Impression   PTA pt lived with a roommate, independent in all ADL, IADL, and mobility tasks. Pt does not ambulate with an assistive device and reports 3-4 falls in the last 6 months. Pt still drives. Pt does not use oxygen at home and is currently on 6L [place]. SpO2 dropped to 86% during activity with pt requiring ~1 min seated recovery to return to 90s. Pt currently requires setup to mod assist for self-care and functional transfer tasks. Pt able to ambulate short distance to bedroom sink and pivot to/from [facility] with min assist x 2. Pt required cues for safety as pt is slightly impulsive. Pt demonstrates decreased strength, endurance, balance, standing tolerance, activity tolerance, and safety awareness impacting ability to complete self-care and functional transfer tasks. Recommend skilled OT services to address above deficits in order to promote function and prevent further decline. Recommend HH OT for continued rehab following hospital discharge.     Follow Up Recommendations  Home health OT;Supervision/Assistance - 24 hour(Pay may require short term SNF depending on progress)    Equipment Recommendations  3 in 1 bedside commode;Tub/shower bench    Recommendations for Other Services       Precautions / Restrictions Precautions Precautions: Fall;Other (comment) Precaution Comments: Monitor SpO2 Restrictions Weight Bearing Restrictions: No      Mobility Bed Mobility Overal bed mobility: Needs Assistance Bed Mobility: Supine to Sit;Sit to Supine     Supine to sit: Mod assist;+2 for physical assistance;+2 for safety/equipment;HOB elevated Sit to supine: Min guard   General bed mobility comments: Pt  able to initiate task and bring BLEs off bed. Assist to power up into sitting.   Transfers Overall transfer level: Needs assistance Equipment used: 2 person hand held assist Transfers: Sit to/from [employer] Sit to Stand: Min assist;+2 physical assistance;+2 safety/equipment Stand pivot transfers: Min assist;+2 physical assistance;+2 safety/equipment       General transfer comment: Assist for balance and safety.     Balance Overall balance assessment: Needs assistance Sitting-balance support: Feet supported Sitting balance-Leahy Scale: Fair       Standing balance-Leahy Scale: Poor                             ADL either performed or assessed with clinical judgement   ADL Overall ADL's : Needs assistance/impaired Eating/Feeding: Bed level;Set up   Grooming: Set up;Supervision/safety;Sitting   Upper Body Bathing: Set up;Supervision/ safety;Sitting   Lower Body Bathing: Sit to/from stand;Sitting/lateral leans;Moderate assistance   Upper Body Dressing : Set up;Supervision/safety;Sitting   Lower Body Dressing: Moderate assistance;Sitting/lateral leans   Toilet Transfer: Minimal assistance;+2 for physical assistance;+2 for safety/equipment;BSC   Toileting- Clothing Manipulation and Hygiene: Moderate assistance;Sit to/from stand       Functional mobility during ADLs: Minimal assistance;+2 for physical assistance;+2 for safety/equipment General ADL Comments: Pt able to stand ~1 min at bedroom sink and transfer to/from [facility] with min assist x 2. Pt required cues throughout for safety.      Vision Baseline Vision/History: No visual deficits       Perception     Praxis      Pertinent Vitals/Pain Pain Assessment: Faces Faces Pain Scale: Hurts a little bit Pain  Location: Headache and chest "twinge" RN and MD notified.  Pain Descriptors / Indicators: ([place]) Pain Intervention(s): Monitored during session     Hand Dominance Right    Extremity/Trunk Assessment Upper Extremity Assessment Upper Extremity Assessment: Generalized weakness   Lower Extremity Assessment Lower Extremity Assessment: Defer to PT evaluation       Communication Communication Communication: No difficulties   Cognition Arousal/Alertness: Lethargic Behavior During Therapy: WFL for tasks assessed/performed Overall Cognitive Status: No family/caregiver present to determine baseline cognitive functioning                                 General Comments: A&O x 4. Able to follow one step instructions. Cues throughout for safety. Pt slightly impulsive.    General Comments  Pt does not use oxygen at home. Pt currently on 6L [place] with SpO2 dropping to 86% during activity. Pt required ~1 min seated recovery to return to 90s.     Exercises     Shoulder Instructions      Home Living Family/patient expects to be discharged to:: Private residence Living Arrangements: Other (Comment)(roommate) Available Help at Discharge: Family Type of Home: Apartment(1st floor) Home Access: Stairs to enter Entrance Stairs-Number of Steps: 3 Entrance Stairs-Rails: None Home Layout: One level     Bathroom Shower/Tub: Teacher, early years/pre: Standard     Home Equipment: Grab bars - tub/shower          Prior Functioning/Environment Level of Independence: Independent        Comments: Pt does not ambulate with an assistive device and reports 3-4 falls in the last month. Pt independent in all ADL, IADL, and mobility tasks. Pt still drives.         OT Problem List: Decreased strength;Decreased activity tolerance;Impaired balance (sitting and/or standing);Decreased cognition;Decreased safety awareness;Cardiopulmonary status limiting activity      OT Treatment/Interventions: Self-care/ADL training;Therapeutic exercise;Neuromuscular education;Energy conservation;DME and/or AE instruction;Therapeutic activities;Patient/family  education;Balance training    OT Goals(Current goals can be found in the care plan section) Acute Rehab OT Goals Patient Stated Goal: Pt agreeable to working with therapy Time For Goal Achievement: 09/13/19 Potential to Achieve Goals: Good ADL Goals Pt Will Perform Grooming: with supervision;standing Pt Will Perform Lower Body Bathing: with supervision;sit to/from stand Pt Will Perform Lower Body Dressing: with supervision;sit to/from stand Pt Will Transfer to Toilet: with supervision;regular height toilet;ambulating Pt Will Perform Toileting - Clothing Manipulation and hygiene: with supervision;sit to/from stand Additional ADL Goal #1: Pt to tolerate standing up to 5 min with supervision, in preparation for ADLs. Additional ADL Goal #2: Pt to recall and verbalize 3 energy conservation strategies with 0 verbal cues. Additional ADL Goal #3: Pt to recall and verbalize 3 fall prevention strategies with 0 verbal cues.  OT Frequency: Min 2X/week   Barriers to D/C:            Co-evaluation PT/OT/SLP Co-Evaluation/Treatment: Yes Reason for Co-Treatment: For patient/therapist safety;To address functional/ADL transfers   OT goals addressed during session: ADL's and self-care;Strengthening/ROM      AM-PAC OT "6 Clicks" Daily Activity     Outcome Measure Help from another person eating meals?: A Little Help from another person taking care of personal grooming?: A Little Help from another person toileting, which includes using toliet, bedpan, or urinal?: A Lot Help from another person bathing (including washing, rinsing, drying)?: A Lot Help from another person to put on and taking off  regular upper body clothing?: A Little Help from another person to put on and taking off regular lower body clothing?: A Lot 6 Click Score: 15   End of Session Equipment Utilized During Treatment: Oxygen Nurse Communication: Mobility status  Activity Tolerance: Patient limited by fatigue;Patient limited  by lethargy Patient left: in bed;with call bell/phone within reach;with nursing/sitter in room;with bed alarm set  OT Visit Diagnosis: Unsteadiness on feet (R26.81);Muscle weakness (generalized) (M62.81)                Time: 5631-4970 OT Time Calculation (min): 26 min Charges:  OT General Charges $OT Visit: 1 Visit OT Evaluation $OT Eval Moderate Complexity: 1 Mod  Peterson Ao OTR/L [phone]  Peterson Ao 08/30/2019, 9:38 AM

## 2019-08-30 NOTE — TOC Initial Note (Signed)
Transition of Care [facility]) - Initial/Assessment Note    Patient Details  Name: Breanna Moore MRN: [id number] Date of Birth: Sep 12, 1975  Transition of [facility]) CM/SW Contact:    Bethena Roys, RN Phone Number: 08/30/2019, 2:58 PM  Clinical Narrative: Patient presented for dyspnea. Prior to arrival patient was from home alone. 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 Case Manager received a referral for home CPAP. Patient recently had home sleep study- however no titration was done. Patient wants to use [facility] for Durable medical equipment CPAP- referral made to [facility] with [facility]. Case Manager asked for order for auto titration for CPAP 4-20 for MD to place in Epic to submit to insurance. [facility] Liaison is trying to find proof of sleep study to submit to insurance. Once he has the information to submit- we await insurance to approve. Patient and MD aware that patient will not have CPAP at discharge because patient will still need to be fit in office for mask. Case Manager discussed home health services with the patient. Patient and mother to discuss plan of care due to patient lives alone. Both had discussed that patient my move in with parents in [place]. Case Manager to continue to follow for additional transition of care needs.                Expected Discharge Plan: Skilled Nursing Facility Barriers to Discharge: Continued Medical Work up   Patient Goals and CMS Choice Patient states their goals for this hospitalization and ongoing recovery are:: to go to skilled nursing facility CMS Medicare.gov Compare Post Acute Care list provided to:: Patient Choice offered to / list presented to : Patient  Expected Discharge Plan and Services Expected Discharge Plan: [place] In-house Referral: NA Discharge Planning Services: CM Consult Post Acute Care Choice: [place] arrangements for the past 2 months: Single Family Home                 DME Arranged: Continuous positive airway pressure (CPAP) DME Agency: Other - Comment([facility]) Date DME Agency Contacted: 08/30/19 Time DME Agency Contacted: 69 Representative spoke with at DME Agency: Jeneen Rinks HH Arranged: RN, Disease Management, PT, OT, Nurse's Aide     Prior Living Arrangements/Services Living arrangements for the past 2 months: Single Family Home Lives with:: Self   Do you feel safe going back to the place where you live?: No   SNF  Need for Family Participation in Patient Care: Yes (Comment) Care giver support system in place?: Yes (comment)   Criminal Activity/Legal Involvement Pertinent to Current Situation/Hospitalization: No - Comment as needed  Activities of Daily Living Home Assistive Devices/Equipment: None ADL Screening (condition at time of admission) Patient's cognitive ability adequate to safely complete daily activities?: Yes Is the patient deaf or have difficulty hearing?: No Does the patient have difficulty seeing, even when wearing glasses/contacts?: No Does the patient have difficulty concentrating, remembering, or making decisions?: No Patient able to express need for assistance with ADLs?: Yes Does the patient have difficulty dressing or bathing?: No Independently performs ADLs?: Yes (appropriate for developmental age) Does the patient have difficulty walking or climbing stairs?: No Weakness of Legs: None Weakness of Arms/Hands: None  Permission Sought/Granted Permission sought to share information with : Case Manager, Customer service manager, Family Supports       Permission granted to share info w AGENCY: [facility]        Emotional Assessment Appearance::  Appears stated age Attitude/Demeanor/Rapport: Engaged Affect (typically observed): Appropriate Orientation: : Oriented to Situation, Oriented to  Time, Oriented to Place, Oriented to Self Alcohol / Substance Use: Not Applicable Psych Involvement: No  (comment)  Admission diagnosis:  Dyspnea [R06.00] OSA (obstructive sleep apnea) [G47.33] Hypoxia [R09.02] Patient Active Problem List   Diagnosis Date Noted  . Chest pain, atypical 08/29/2019  . Acute on chronic respiratory failure with hypoxia and hypercapnia (HCC) 08/29/2019  . Microcytic anemia 08/29/2019  . Aneurysm of ascending aorta (HCC) 08/29/2019  . Non-toxic multinodular goiter 08/29/2019  . Atelectasis of both lungs 08/29/2019  . Wheezing 08/29/2019  . Nocturnal oxygen desaturation 08/29/2019  . Obesity hypoventilation syndrome (HCC) 08/29/2019  . LVH (left ventricular hypertrophy) 08/29/2019  . Hypersomnolence 08/29/2019  . BMI 60.0-69.9, adult (HCC)   . Dyspnea 08/28/2019  . OSA (obstructive sleep apnea) 08/28/2019  . Shortness of breath on exertion 08/11/2017  . Essential hypertension 08/11/2017  . Morbid obesity (HCC) 08/11/2017  . Cigarette smoker 08/11/2017  . Schizo-affective psychosis (HCC) 05/25/2014   PCP:  Health, [facility] Pharmacy:   [facility] [address] [place] - [address] [place] [address] Phone: [phone] Fax: [phone]    Readmission Risk Interventions No flowsheet data found.
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
## 2019-08-30 NOTE — Evaluation (Signed)
Physical Therapy Evaluation Patient Details Name: Breanna Moore MRN: [id number] DOB: 12/25/1975 Today's Date: 08/30/2019   History of Present Illness  Vesna Kable is a 44 y.o. female presenting with dyspnea. PMH is significant for HTN, OSA, obesity and major depression.  Clinical Impression  Patient received in bed, OT present in room. She is agreeable to PT/OT evaluation. She reports headache, generally not feeling good. Increased lethargy. She requires min assist +2 for supine to sit with HOB elevated. She requires +2 min assist for sit to stand and ambulation of 10 feet. Required assist with maintaining hygiene after using BSC. Her mobility is limited by weakness, and unsteadiness at this time. She has history of multiple falls and will benefit from skilled PT while here to improve strength, balance and functional independence.         Follow Up Recommendations SNF;Supervision for mobility/OOB    Equipment Recommendations  Rolling walker with 5" wheels;3in1 (PT)(needs bariatric equipment)    Recommendations for Other Services       Precautions / Restrictions Precautions Precautions: Fall Precaution Comments: Monitor SpO2 Restrictions Weight Bearing Restrictions: No      Mobility  Bed Mobility Overal bed mobility: Needs Assistance Bed Mobility: Supine to Sit;Sit to Supine     Supine to sit: Mod assist;+2 for physical assistance;+2 for safety/equipment;HOB elevated Sit to supine: Min guard   General bed mobility comments: Pt able to initiate task and bring BLEs off bed. Assist to power up into sitting.   Transfers Overall transfer level: Needs assistance Equipment used: 2 person hand held assist Transfers: Sit to/from [facility] Sit to Stand: Min assist;+2 physical assistance;+2 safety/equipment Stand pivot transfers: Min assist;+2 physical assistance;+2 safety/equipment       General transfer comment: Assist for balance and safety.    Ambulation/Gait Ambulation/Gait assistance: Min assist;+2 physical assistance;+2 safety/equipment Gait Distance (Feet): 10 Feet Assistive device: 2 person hand held assist Gait Pattern/deviations: Step-to pattern;Trunk flexed;Decreased step length - right;Decreased step length - left;Wide base of support Gait velocity: decr   General Gait Details: patient generally unsteady, unsafe with mobility. Able to walk to side of counter/sink and then leaning over on sink, fatigued with all mobility. O2 sats down to 86% at one point with activity on 6 lpm.  Stairs            Wheelchair Mobility    Modified Rankin (Stroke Patients Only)       Balance Overall balance assessment: Needs assistance Sitting-balance support: Feet supported Sitting balance-Leahy Scale: Fair     Standing balance support: Bilateral upper extremity supported;During functional activity Standing balance-Leahy Scale: Poor Standing balance comment: patient unsteady, limited mobility due to weakness, sob.                             Pertinent Vitals/Pain Pain Assessment: Faces Faces Pain Scale: Hurts a little bit Pain Location: Headache and chest "twinge" RN and MD notified.  Pain Descriptors / Indicators: ([place]) Pain Intervention(s): Monitored during session    Home Living Family/patient expects to be discharged to:: Private residence Living Arrangements: Non-relatives/Friends Available Help at Discharge: Family;Available PRN/intermittently Type of Home: Apartment Home Access: Stairs to enter Entrance Stairs-Rails: None Entrance Stairs-Number of Steps: 3 Home Layout: One level Home Equipment: Grab bars - tub/shower      Prior Function Level of Independence: Independent         Comments: Pt does not ambulate with an assistive device and reports 3-4  falls in the last month. Pt independent in all ADL, IADL, and mobility tasks. Pt still drives.      Hand Dominance   Dominant Hand:  Right    Extremity/Trunk Assessment   Upper Extremity Assessment Upper Extremity Assessment: Defer to OT evaluation    Lower Extremity Assessment Lower Extremity Assessment: Generalized weakness    Cervical / Trunk Assessment Cervical / Trunk Assessment: Normal  Communication   Communication: No difficulties  Cognition Arousal/Alertness: Awake/alert;Lethargic Behavior During Therapy: WFL for tasks assessed/performed Overall Cognitive Status: No family/caregiver present to determine baseline cognitive functioning                                 General Comments: A&O x 4. Able to follow one step instructions. Cues throughout for safety. Pt slightly impulsive.       General Comments General comments (skin integrity, edema, etc.): Pt does not use oxygen at home. Pt currently on 6L [place] with SpO2 dropping to 86% during activity. Pt required ~1 min seated recovery to return to 90s.     Exercises     Assessment/Plan    PT Assessment Patient needs continued PT services  PT Problem List Decreased strength;Decreased mobility;Decreased safety awareness;Decreased balance;Decreased activity tolerance;Decreased knowledge of use of DME;Cardiopulmonary status limiting activity;Obesity       PT Treatment Interventions DME instruction;Therapeutic activities;Gait training;Therapeutic exercise;Patient/family education;Balance training;Functional mobility training;Neuromuscular re-education    PT Goals (Current goals can be found in the Care Plan section)  Acute Rehab PT Goals Patient Stated Goal: get stronger, go home PT Goal Formulation: With patient Time For Goal Achievement: 09/13/19 Potential to Achieve Goals: Fair    Frequency Min 3X/week   Barriers to discharge Decreased caregiver support;Inaccessible home environment needs to be able to go up 3 steps to enter home. Has roomate but otherwise alone.    Co-evaluation PT/OT/SLP Co-Evaluation/Treatment: Yes Reason for  Co-Treatment: For patient/therapist safety;To address functional/ADL transfers PT goals addressed during session: Mobility/safety with mobility;Balance OT goals addressed during session: ADL's and self-care;Strengthening/ROM       AM-PAC PT "6 Clicks" Mobility  Outcome Measure Help needed turning from your back to your side while in a flat bed without using bedrails?: A Lot Help needed moving from lying on your back to sitting on the side of a flat bed without using bedrails?: A Lot Help needed moving to and from a bed to a chair (including a wheelchair)?: A Little Help needed standing up from a chair using your arms (e.g., wheelchair or bedside chair)?: A Little Help needed to walk in hospital room?: A Lot Help needed climbing 3-5 steps with a railing? : Total 6 Click Score: 13    End of Session   Activity Tolerance: Patient limited by fatigue Patient left: in bed;with bed alarm set;with call bell/phone within reach;with nursing/sitter in room Nurse Communication: Mobility status PT Visit Diagnosis: Repeated falls (R29.6);Other abnormalities of gait and mobility (R26.89);Unsteadiness on feet (R26.81);Muscle weakness (generalized) (M62.81);Difficulty in walking, not elsewhere classified (R26.2)    Time: 4098-1191 PT Time Calculation (min) (ACUTE ONLY): 22 min   Charges:   PT Evaluation $PT Eval Moderate Complexity: 1 Mod PT Treatments $Gait Training: 8-22 mins        Shianna Bally, PT, GCS 08/30/19,11:06 AM

## 2019-08-31 DIAGNOSIS — I712 Thoracic aortic aneurysm, without rupture: Secondary | ICD-10-CM

## 2019-08-31 DIAGNOSIS — E042 Nontoxic multinodular goiter: Secondary | ICD-10-CM

## 2019-08-31 DIAGNOSIS — J9621 Acute and chronic respiratory failure with hypoxia: Secondary | ICD-10-CM | POA: Diagnosis not present

## 2019-08-31 DIAGNOSIS — Z6841 Body Mass Index (BMI) 40.0 and over, adult: Secondary | ICD-10-CM | POA: Diagnosis not present

## 2019-08-31 DIAGNOSIS — R0602 Shortness of breath: Secondary | ICD-10-CM | POA: Diagnosis not present

## 2019-08-31 DIAGNOSIS — G4733 Obstructive sleep apnea (adult) (pediatric): Secondary | ICD-10-CM

## 2019-08-31 DIAGNOSIS — D509 Iron deficiency anemia, unspecified: Secondary | ICD-10-CM

## 2019-08-31 DIAGNOSIS — E049 Nontoxic goiter, unspecified: Secondary | ICD-10-CM

## 2019-08-31 LAB — FOLATE RBC
Folate, Hemolysate: 431 ng/mL
Folate, RBC: 1159 ng/mL (ref >498)
Hematocrit: 37.2 % (ref 34.0–46.6)

## 2019-08-31 LAB — BASIC METABOLIC PANEL
Anion gap: 9 (ref 5–15)
BUN: 12 mg/dL (ref 6–20)
CO2: 39 mmol/L — ABNORMAL HIGH (ref 22–32)
Calcium: 9.1 mg/dL (ref 8.9–10.3)
Chloride: 89 mmol/L — ABNORMAL LOW (ref 98–111)
Creatinine, Ser: 0.73 mg/dL (ref 0.44–1.00)
GFR calc Af Amer: >60 mL/min (ref >60)
GFR calc non Af Amer: >60 mL/min (ref >60)
Glucose, Bld: 102 mg/dL — ABNORMAL HIGH (ref 70–99)
Potassium: 4.4 mmol/L (ref 3.5–5.1)
Sodium: 137 mmol/L (ref 135–145)

## 2019-08-31 LAB — CBC
HCT: 36.7 % (ref 36.0–46.0)
Hemoglobin: 9.4 g/dL — ABNORMAL LOW (ref 12.0–15.0)
MCH: 18.1 pg — ABNORMAL LOW (ref 26.0–34.0)
MCHC: 25.6 g/dL — ABNORMAL LOW (ref 30.0–36.0)
MCV: 70.8 fL — ABNORMAL LOW (ref 80.0–100.0)
Platelets: 386 10*3/uL (ref 150–400)
RBC: 5.18 MIL/uL — ABNORMAL HIGH (ref 3.87–5.11)
RDW: 20.7 % — ABNORMAL HIGH (ref 11.5–15.5)
WBC: 11.3 10*3/uL — ABNORMAL HIGH (ref 4.0–10.5)
nRBC: 0 % (ref 0.0–0.2)

## 2019-08-31 MED ORDER — SODIUM CHLORIDE 0.9 % IV SOLN
510.0000 mg | Freq: Once | INTRAVENOUS | Status: AC
  Administered 2019-08-31: 13:00:00 510 mg via INTRAVENOUS
  Filled 2019-08-31: qty 17

## 2019-08-31 MED ORDER — FERROUS SULFATE 325 (65 FE) MG PO TABS
325.0000 mg | ORAL_TABLET | Freq: Every day | ORAL | Status: DC
  Administered 2019-09-01 – 2019-09-08 (×8): 325 mg via ORAL
  Filled 2019-08-31 (×8): qty 1

## 2019-08-31 NOTE — Discharge Summary (Signed)
[facility] Discharge Summary  Patient name: Breanna Moore Medical record number: [id number] Date of birth: May 06, 1975 Age: 44 y.o. Gender: female Date of Admission: 08/28/2019  Date of Discharge:  Admitting Physician: Blane Ohara McDiarmid, MD  Primary Care Provider: [facility], [place] Consultants: none  Indication for Hospitalization: hypercarbic respiratory  Discharge Diagnoses/Problem List:  OHS OSA HTN Morbid Obesity MDD Iron deficiency anemia Thoracic aortic aneurysm Thyroid nodules Stress incontinence  Disposition: to mother's home  Discharge Condition: stable and improved  Discharge Exam:  Vitals:   09/08/19 0027 09/08/19 0301 09/08/19 0844 09/08/19 0845  BP: 115/75 113/83 137/89 125/88  Pulse: 94 91    Temp: 98 F (36.7 C) 98 F (36.7 C)    Resp: 20 18    Height:      Weight:  (!) 177.3 kg    SpO2: 94% 93%    TempSrc: Oral Oral    BMI (Calculated):  63.12    General: Very pleasant 44 year old Afro-American woman, no acute distress, sitting in chair Cardiac: Regular rate and rhythm, no M/R/G Respiratory: Lungs clear to auscultation bilaterally, no accessory muscle use.  On RA. Extremities: Skin warm and dry, trace lower extremity edema Neuro: no focal neurologic deficits appreciated; normal sensation in feet, 2+ DTRs Psych: Very pleasant, normal mood and affect  Brief Hospital Course:  Breanna Moore is a 44 y.o. female who presented with dyspnea. PMH is significant for HTN, OSA, obesity and major depression.   Hypercarbic respiratory failure 2/2 OSA, OHS Presented with acute dyspnea x1 day. ED work up s/f labs: pH 7.2, PCO2 90, PO2 86, bicarb 38, BNP 185, troponin 12, 11 and Hb 9.6 CXR: mild enlargement of the cardiopericardial silhouette, WBC wnl and no fevers. Echo: EF 65-70% Moderate dilatation of the right ventricle and right atrium with RV strain suspicious for a pulmonary embolism or other lung pathology. Grade II  diastolic dysfunction. CTA: No definite evidence of PE but limited by suboptimal opacification of the pulmonary arteries and beam hardening artifacts. She was continued on duonebs and started on prednisone for a 5 day course, ended 5/6. Patient found to have respiratory acidosis and was hypercarbic (pCO2 76.7, pH 7.321) and improved on bipap, supporting diagnosis of OHS. Patient previously had home sleep study that confirmed OSA and was awaiting sleep study to receive CPAP, however, she acutely decompensated at home leading to this admission. CPAP for home obtained in hospital and was used nightly during hospitalization and patient's respiratory status improved. Patient received home CPAP in hospital and has been using it successfully, however she was noted to need O2 via [place] bled into CPAP. Patient needs outpatient titration sleep study to qualify for oxygen with CPAP. Patient has gained 50 lbs during the pandemic and has a BMI >60. She is motivated to lose weight and has lost 10 lbs during this hospitalization. Referral to weight loss clinic and physical therapy for severe deconditioning.  Enlarged thyroid CTA: Enlargement of the thyroid lobes asymmetrically greater on LEFT, extending retrosternal, with slight mass effect upon the trachea and mild LEFT-to-RIGHT midline shift. No discrete thyroid mass seen. Thyroid US: 2 cm right superior thyroid nodule meets criteria for 1 year follow-up ultrasound. 4.8 cm solid and cystic left inferior thyroid nodule (nodule 2) technically does not meet criteria for follow-up. However, given its size, 1 year follow-up ultrasound is felt to be appropriate at the time of follow-up of the other nodule. TSH WNL at 1.233. See full CTA report below.  Thoracic Aortic Aneurysm Incidentally noticed on CTA, 4.4 cm, see full report below. Patient should have annual CT to assess size and be referred to vascular surgery if increases >5cm.  Iron Deficiency Anemia Chronically low hgb to  10-11, down to 9.6 on admission; patient still menstruates. Anemia panel with low ferritin, confirming iron deficiency anemia. S/p ferraheme x 1 on 5/4. Improved to 10.9 by 5/8. Continue outpatient monitoring and oral iron supplements.  Issues for Follow Up:  1. Hypoxia: Patient received home CPAP in hospital and has been using it successfully, however she was noted to need O2 via [place] bled into CPAP. Patient needs outpatient titration sleep study to qualify for oxygen with CPAP. Please keep titration appointment. 2. Weight loss: for treatment of OSA/OHS with BMI >60, ambulatory referral to healthy weight and wellness 3. Deconditioning: Patient is deconditioned and can only walk about 20 ft without stopping. Referred to outpatient PT as could not locate home health PT.  4. Thoracic Aortic Aneurysm: Incidentally noted this admission on CTA, 4.4 cm. Recommend annual exam via CTA or MRA. 5. Thyroid nodules: 2 cm right superior thyroid nodule requires 1 year follow-up ultrasound.  4.8 cm solid cystic left inferior thyroid nodule should also have a 1 year follow-up ultrasound. 6. Iron deficiency anemia: patient s/p ferraheme x1 and fe suplements started. Recommend monitoring for improvement.  Significant Procedures: none  Significant Labs and Imaging:  Recent Labs  Lab 09/03/19 0415 09/04/19 0514 09/06/19 0452  WBC 14.9* 11.3* 9.6  HGB 10.4* 10.9* 10.1*  HCT 41.0 42.2 38.4  PLT 414* 392 424*   Recent Labs  Lab 09/02/19 0328 09/02/19 0328 09/03/19 0415 09/04/19 0514  NA 140  --  140 137  K 4.3   < > 4.5 4.4  CL 93*  --  94* 92*  CO2 36*  --  34* 30  GLUCOSE 85  --  81 89  BUN 18  --  19 21*  CREATININE 0.90  --  0.84 0.87  CALCIUM 9.4  --  9.4 9.3   < > = values in this interval not displayed.   CT ANGIOGRAPHY CHEST WITH CONTRAST CONTRAST:  OMNIPAQUE IOHEXOL 350 MG/ML SOLN IV COMPARISON:  None FINDINGS: Cardiovascular: Aneurysmal dilatation ascending thoracic aorta 4.4 cm  transverse image 37. No aortic dissection. Heart unremarkable. No pericardial effusion. Suboptimal opacification of pulmonary arterial system due to timing with additional degradation of image quality related to body habitus. No large or central pulmonary emboli are identified. Unable to exclude small and peripheral emboli by this exam. Mediastinum/Nodes: Esophagus unremarkable. Enlargement of the thyroid lobes asymmetrically greater on LEFT, extending retrosternal, with slight mass effect upon the trachea and mild LEFT-to-RIGHT midline shift. No discrete thyroid mass seen. Multiple normal sized axillary nodes. No definite thoracic adenopathy. Lungs/Pleura: Subsegmental atelectasis BILATERAL lower lobes and RIGHT middle lobe. No infiltrate, pleural effusion or pneumothorax. Upper Abdomen: Mass at medial aspect upper pole LEFT kidney 3.1 x 2.6 cm, incompletely visualized but likely cyst. Musculoskeletal: Unremarkable Review of the MIP images confirms the above findings. IMPRESSION: No definite evidence of large or pulmonary embolism identified on exam limited by suboptimal opacification of the pulmonary arteries and beam hardening artifacts as discussed above. Subsegmental atelectasis in both lungs. Probable upper pole LEFT renal cyst. Aneurysmal dilatation ascending thoracic aorta 4.4 cm diameter, recommendation below. Recommend annual imaging followup by CTA or MRA. This recommendation follows 2010 ACCF/AHA/AATS/ACR/ASA/SCA/SCAI/SIR/STS/SVM Guidelines for the Diagnosis and Management of Patients with Thoracic Aortic Disease. Circulation. 2010;  121: [id number]. Aortic aneurysm NOS (ICD10-I71.9) Electronically Signed   By: Ulyses Southward M.D.   On: 08/28/2019 14:16  Results/Tests Pending at Time of Discharge: none  Discharge Medications:  Allergies as of 09/08/2019      Reactions   Hydrocodone-acetaminophen    Ketorolac    Paxil [paroxetine Hcl]    Penicillins Rash   Has patient  had a PCN reaction causing immediate rash, facial/tongue/throat swelling, SOB or lightheadedness with hypotension: Yes Has patient had a PCN reaction causing severe rash involving mucus membranes or skin necrosis: No Has patient had a PCN reaction that required hospitalization: No Has patient had a PCN reaction occurring within the last 10 years: No If all of the above answers are "NO", then may proceed with Cephalosporin use.      Medication List    STOP taking these medications   benzonatate 100 MG capsule Commonly known as: TESSALON   methocarbamol 500 MG tablet Commonly known as: ROBAXIN     TAKE these medications   albuterol 108 (90 Base) MCG/ACT inhaler Commonly known as: VENTOLIN HFA Inhale 1-2 puffs into the lungs every 6 (six) hours as needed for wheezing or shortness of breath.   ARIPiprazole 5 MG tablet Commonly known as: ABILIFY Take 1 tablet (5 mg total) by mouth at bedtime.   ferrous sulfate 325 (65 FE) MG tablet Take 1 tablet (325 mg total) by mouth daily with breakfast. Start taking on: Sep 09, 2019   FLUoxetine 20 MG capsule Commonly known as: PROZAC Take 1 capsule (20 mg total) by mouth at bedtime.   fluticasone 50 MCG/ACT nasal spray Commonly known as: FLONASE Place 1 spray into both nostrils daily.   hydrochlorothiazide 25 MG tablet Commonly known as: HYDRODIURIL Take 1 tablet (25 mg total) by mouth daily.   hydrOXYzine 10 MG tablet Commonly known as: ATARAX/VISTARIL Take 1 tablet (10 mg total) by mouth 3 (three) times daily as needed for anxiety.   lisinopril 10 MG tablet Commonly known as: ZESTRIL Take 1 tablet (10 mg total) by mouth daily.   loratadine 10 MG tablet Commonly known as: Claritin Take 1 tablet (10 mg total) by mouth daily.            Durable Medical Equipment  (From admission, onward)         Start     Ordered   09/07/19 1610  For home use only DME Tub bench  Once     09/07/19 1609   09/07/19 1319  For home use  only DME Shower stool  Once    Comments: bariatric   09/07/19 1318   09/07/19 1319  For home use only DME Walker rolling  Once    Comments: bariatric  Question Answer Comment  Walker: With 5 Inch Wheels   Patient needs a walker to treat with the following condition Dyspnea      09/07/19 1318   09/07/19 1319  For home use only DME 3 n 1  Once    Comments: bariatric   09/07/19 1318   08/31/19 1541  For home use only DME continuous positive airway pressure (CPAP)  Once    Comments: Auto titration 6cm to 20cm  Question Answer Comment  Length of Need Lifetime   Patient has OSA or probable OSA Yes   Is the patient currently using CPAP in the home No   Settings Autotitration   CPAP supplies needed Mask, headgear, cushions, filters, heated tubing and water chamber  08/31/19 1541          Discharge Instructions: Please refer to Patient Instructions section of EMR for full details.  Patient was counseled important signs and symptoms that should prompt return to medical care, changes in medications, dietary instructions, activity restrictions, and follow up appointments.   Follow-Up Appointments:  Contact information for follow-up providers    [facility] Follow up.   Why: CPAP- machine delivered to the patient's room, Oxygen (if patient qualifies), 3n1, shower stool, rolling walker Contact information: [address] [place] [address] [phone]        [facility] Follow up.   Why: Make an appointment with your primary care provider in 1-2 weeks Contact information: [facility] [place] [address] [phone]            Contact information for after-discharge care    Destination    [facility] .   Service: Skilled Nursing Contact information: [address] [place] [address] [phone]                  Shirlean Mylar, MD 09/08/2019, 1:56 PM PGY-1, [facility]  Family Medicine

## 2019-08-31 NOTE — Progress Notes (Signed)
Patient resting on BIPAP.

## 2019-08-31 NOTE — Progress Notes (Signed)
                           RE: Breanna Moore          Date of Birth:  August 24, 1975       Date:  08/31/2019              To Whom It May Concern:      Please be advised that the above-named patient will require a short-term nursing home stay - anticipated 30 days or less for rehabilitation and strengthening.  The plan is for return home.

## 2019-08-31 NOTE — Plan of Care (Signed)

## 2019-08-31 NOTE — TOC Initial Note (Signed)
Transition of Care [facility]) - Initial/Assessment Note    Patient Details  Name: Breanna Moore MRN: [id number] Date of Birth: 1976-01-29  Transition of Care [facility]) CM/SW Contact:    Terrial Rhodes, LCSWA Phone Number: 08/31/2019, 4:22 PM  Clinical Narrative:                  CSW spoke with patient  At bedside. Patient agreeable to SNF placement. Patient is agreeable to CSW faxing out initial referral in [place] area.  Pending bed offers. Pending insurance authorization.  CSW will continue to follow.   Expected Discharge Plan: Skilled Nursing Facility Barriers to Discharge: Continued Medical Work up   Patient Goals and CMS Choice Patient states their goals for this hospitalization and ongoing recovery are:: to go to skilled nursing facility CMS Medicare.gov Compare Post Acute Care list provided to:: Patient Choice offered to / list presented to : Patient  Expected Discharge Plan and Services Expected Discharge Plan: Skilled Nursing Facility In-house Referral: NA Discharge Planning Services: CM Consult Post Acute Care Choice: Home Health Living arrangements for the past 2 months: Single Family Home                 DME Arranged: Continuous positive airway pressure (CPAP) DME Agency: Other - Comment([facility]) Date DME Agency Contacted: 08/30/19 Time DME Agency Contacted: 1130 Representative spoke with at DME Agency: Fayrene Fearing HH Arranged: RN, Disease Management, PT, OT, Nurse's Aide          Prior Living Arrangements/Services Living arrangements for the past 2 months: Single Family Home Lives with:: Self Patient language and need for interpreter reviewed:: Yes Do you feel safe going back to the place where you live?: No   SNFs  Need for Family Participation in Patient Care: Yes (Comment) Care giver support system in place?: Yes (comment)   Criminal Activity/Legal Involvement Pertinent to Current Situation/Hospitalization: No - Comment as needed  Activities of Daily  Living Home Assistive Devices/Equipment: None ADL Screening (condition at time of admission) Patient's cognitive ability adequate to safely complete daily activities?: Yes Is the patient deaf or have difficulty hearing?: No Does the patient have difficulty seeing, even when wearing glasses/contacts?: No Does the patient have difficulty concentrating, remembering, or making decisions?: No Patient able to express need for assistance with ADLs?: Yes Does the patient have difficulty dressing or bathing?: No Independently performs ADLs?: Yes (appropriate for developmental age) Does the patient have difficulty walking or climbing stairs?: No Weakness of Legs: None Weakness of Arms/Hands: None  Permission Sought/Granted Permission sought to share information with : Case Manager, Magazine features editor Permission granted to share information with : Yes, Verbal Permission Granted  Share Information with NAME: Bronson Ing  Permission granted to share info w AGENCY: SNFs  Permission granted to share info w Relationship: Sister  Permission granted to share info w Contact Information: Bronson Ing [phone]  Emotional Assessment Appearance:: Appears stated age Attitude/Demeanor/Rapport: Gracious Affect (typically observed): Calm Orientation: : Oriented to Self, Oriented to Place, Oriented to  Time, Oriented to Situation Alcohol / Substance Use: Not Applicable Psych Involvement: No (comment)  Admission diagnosis:  Dyspnea [R06.00] OSA (obstructive sleep apnea) [G47.33] Hypoxia [R09.02] Patient Active Problem List   Diagnosis Date Noted  . Thyroid enlargement   . Chest pain, atypical 08/29/2019  . Acute on chronic respiratory failure with hypoxia and hypercapnia (HCC) 08/29/2019  . Microcytic anemia 08/29/2019  . Aneurysm of ascending aorta (HCC) 08/29/2019  . Non-toxic multinodular goiter 08/29/2019  . Atelectasis of both  lungs 08/29/2019  . Wheezing 08/29/2019  . Nocturnal oxygen  desaturation 08/29/2019  . Obesity hypoventilation syndrome ([place]) 08/29/2019  . LVH (left ventricular hypertrophy) 08/29/2019  . Hypersomnolence 08/29/2019  . BMI 60.0-69.9, adult ([place])   . Dyspnea 08/28/2019  . OSA (obstructive sleep apnea) 08/28/2019  . Shortness of breath on exertion 08/11/2017  . Essential hypertension 08/11/2017  . Morbid obesity ([place]) 08/11/2017  . Cigarette smoker 08/11/2017  . Schizo-affective psychosis ([place]) 05/25/2014   PCP:  [facility], [place] Pharmacy:   [place], [place] [address] Phone: [phone] Fax: [phone]     Social Determinants of Health (SDOH) Interventions    Readmission Risk Interventions No flowsheet data found.
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
## 2019-08-31 NOTE — NC FL2 (Signed)
[place] MEDICAID FL2 LEVEL OF CARE SCREENING TOOL     IDENTIFICATION  Patient Name: Breanna Moore Birthdate: 02-14-1976 Sex: female Admission Date (Current Location): 08/28/2019  [facility] and [place] Number:  Producer, television/film/video and Address:  [facility], [address] [place], [place] [address]      Provider Number: [id number]  Attending Physician Name and Address:  McDiarmid, Leighton Roach, MD  Relative Name and Phone Number:  Bronson Ing    Current Level of Care: Hospital Recommended Level of Care: Skilled Nursing Facility Prior Approval Number:    Date Approved/Denied:   PASRR Number:    Discharge Plan: SNF    Current Diagnoses: Patient Active Problem List   Diagnosis Date Noted  . Thyroid enlargement   . Chest pain, atypical 08/29/2019  . Acute on chronic respiratory failure with hypoxia and hypercapnia (HCC) 08/29/2019  . Microcytic anemia 08/29/2019  . Aneurysm of ascending aorta (HCC) 08/29/2019  . Non-toxic multinodular goiter 08/29/2019  . Atelectasis of both lungs 08/29/2019  . Wheezing 08/29/2019  . Nocturnal oxygen desaturation 08/29/2019  . Obesity hypoventilation syndrome (HCC) 08/29/2019  . LVH (left ventricular hypertrophy) 08/29/2019  . Hypersomnolence 08/29/2019  . BMI 60.0-69.9, adult (HCC)   . Dyspnea 08/28/2019  . OSA (obstructive sleep apnea) 08/28/2019  . Shortness of breath on exertion 08/11/2017  . Essential hypertension 08/11/2017  . Morbid obesity (HCC) 08/11/2017  . Cigarette smoker 08/11/2017  . Schizo-affective psychosis (HCC) 05/25/2014    Orientation RESPIRATION BLADDER Height & Weight     Self, Time, Situation, Place  O2(3 liters of Nasal Cannula and needs Cpap) Continent Weight: (!) 393 lb 8 oz (178.5 kg) Height:  5\' 6"  (167.6 cm)  BEHAVIORAL SYMPTOMS/MOOD NEUROLOGICAL BOWEL NUTRITION STATUS      Continent Diet(See Discharge Summary)  AMBULATORY STATUS COMMUNICATION OF NEEDS Skin   Limited Assist Verbally  Normal                       Personal Care Assistance Level of Assistance  Bathing, Feeding, Dressing Bathing Assistance: Limited assistance Feeding assistance: Independent(able to feed herself) Dressing Assistance: Limited assistance     Functional Limitations Info  Sight, Hearing, Speech Sight Info: Adequate Hearing Info: Adequate Speech Info: Adequate    SPECIAL CARE FACTORS FREQUENCY  PT (By licensed PT), OT (By licensed OT)     PT Frequency: 5x min weekly OT Frequency: 5x min weekly            Contractures Contractures Info: Not present    Additional Factors Info  Code Status Code Status Info: FULL             Current Medications (08/31/2019):  This is the current hospital active medication list Current Facility-Administered Medications  Medication Dose Route Frequency Provider Last Rate Last Admin  . acetaminophen (TYLENOL) tablet 650 mg  650 mg Oral Q6H PRN 10/31/2019, MD   650 mg at 08/30/19 2111  . ARIPiprazole (ABILIFY) tablet 5 mg  5 mg Oral QHS 2112, MD   5 mg at 08/30/19 2111  . chlorhexidine (PERIDEX) 0.12 % solution 15 mL  15 mL Mouth Rinse BID McDiarmid, 2112, MD   15 mL at 08/31/19 0808  . enoxaparin (LOVENOX) injection 90 mg  90 mg Subcutaneous Q24H McDiarmid, 10/31/19, MD   90 mg at 08/30/19 1752  . [START ON 09/01/2019] ferrous sulfate tablet 325 mg  325 mg Oral Q breakfast 11/01/2019, MD      .  FLUoxetine (PROZAC) capsule 20 mg  20 mg Oral QHS Lattie Haw, MD   20 mg at 08/30/19 2111  . hydrochlorothiazide (HYDRODIURIL) tablet 25 mg  25 mg Oral Daily Lattie Haw, MD   25 mg at 08/31/19 2595  . hydrOXYzine (ATARAX/VISTARIL) tablet 10 mg  10 mg Oral TID PRN Lattie Haw, MD      . ipratropium-albuterol (DUONEB) 0.5-2.5 (3) MG/3ML nebulizer solution 3 mL  3 mL Nebulization BID McDiarmid, Blane Ohara, MD   3 mL at 08/31/19 0842  . lidocaine (LIDODERM) 5 % 1 patch  1 patch Transdermal Q24H Lattie Haw, MD      . lisinopril  (ZESTRIL) tablet 10 mg  10 mg Oral Daily Lattie Haw, MD   10 mg at 08/31/19 0808  . MEDLINE mouth rinse  15 mL Mouth Rinse q12n4p McDiarmid, Blane Ohara, MD   15 mL at 08/31/19 1258  . predniSONE (DELTASONE) tablet 40 mg  40 mg Oral QAC breakfast McDiarmid, Blane Ohara, MD   40 mg at 08/31/19 0808     Discharge Medications: Please see discharge summary for a list of discharge medications.  Relevant Imaging Results:  Relevant Lab Results:   Additional Information [id number]  Trula Ore, LCSWA

## 2019-09-01 DIAGNOSIS — I10 Essential (primary) hypertension: Secondary | ICD-10-CM

## 2019-09-01 DIAGNOSIS — G4734 Idiopathic sleep related nonobstructive alveolar hypoventilation: Secondary | ICD-10-CM

## 2019-09-01 DIAGNOSIS — R062 Wheezing: Secondary | ICD-10-CM

## 2019-09-01 DIAGNOSIS — G4710 Hypersomnia, unspecified: Secondary | ICD-10-CM

## 2019-09-01 DIAGNOSIS — E662 Morbid (severe) obesity with alveolar hypoventilation: Principal | ICD-10-CM

## 2019-09-01 LAB — CBC
HCT: 38.4 % (ref 36.0–46.0)
Hemoglobin: 9.9 g/dL — ABNORMAL LOW (ref 12.0–15.0)
MCH: 18.1 pg — ABNORMAL LOW (ref 26.0–34.0)
MCHC: 25.8 g/dL — ABNORMAL LOW (ref 30.0–36.0)
MCV: 70.1 fL — ABNORMAL LOW (ref 80.0–100.0)
Platelets: 389 10*3/uL (ref 150–400)
RBC: 5.48 MIL/uL — ABNORMAL HIGH (ref 3.87–5.11)
RDW: 21.3 % — ABNORMAL HIGH (ref 11.5–15.5)
WBC: 11.1 10*3/uL — ABNORMAL HIGH (ref 4.0–10.5)
nRBC: 0.2 % (ref 0.0–0.2)

## 2019-09-01 LAB — BASIC METABOLIC PANEL
Anion gap: 9 (ref 5–15)
BUN: 14 mg/dL (ref 6–20)
CO2: 40 mmol/L — ABNORMAL HIGH (ref 22–32)
Calcium: 9.4 mg/dL (ref 8.9–10.3)
Chloride: 91 mmol/L — ABNORMAL LOW (ref 98–111)
Creatinine, Ser: 0.83 mg/dL (ref 0.44–1.00)
GFR calc Af Amer: >60 mL/min (ref >60)
GFR calc non Af Amer: >60 mL/min (ref >60)
Glucose, Bld: 84 mg/dL (ref 70–99)
Potassium: 4.2 mmol/L (ref 3.5–5.1)
Sodium: 140 mmol/L (ref 135–145)

## 2019-09-01 NOTE — Plan of Care (Signed)
  Problem: Education: Goal: Knowledge of General Education information will improve Description: Including pain rating scale, medication(s)/side effects and non-pharmacologic comfort measures Outcome: Progressing   Problem: Clinical Measurements: Goal: Will remain free from infection Outcome: Progressing   Problem: Activity: Goal: Risk for activity intolerance will decrease Outcome: Progressing   Problem: Nutrition: Goal: Adequate nutrition will be maintained Outcome: Progressing   Problem: Coping: Goal: Level of anxiety will decrease Outcome: Progressing   Problem: Pain Managment: Goal: General experience of comfort will improve Outcome: Progressing   Problem: Safety: Goal: Ability to remain free from injury will improve Outcome: Progressing   

## 2019-09-01 NOTE — TOC Progression Note (Signed)
Transition of Care [facility]) - Progression Note    Patient Details  Name: Breanna Moore MRN: [id number] Date of Birth: [birth year]  [facility] CM/SW Contact  Terrial Rhodes, LCSWA Phone Number: 09/01/2019, 11:58 AM  Clinical Narrative:     No bed offers at this time for SNF placement. PASARR number under review. CSW will continue to follow for PASARR number and SNF placement.    Expected Discharge Plan: Skilled Nursing Facility Barriers to Discharge: Continued Medical Work up  Expected Discharge Plan and Services Expected Discharge Plan: Skilled Nursing Facility In-house Referral: NA Discharge Planning Services: CM Consult Post Acute Care Choice: Home Health Living arrangements for the past 2 months: Single Family Home                 DME Arranged: Continuous positive airway pressure (CPAP) DME Agency: Other - Comment([facility]) Date DME Agency Contacted: 08/30/19 Time DME Agency Contacted: 1130 Representative spoke with at DME Agency: Fayrene Fearing HH Arranged: RN, Disease Management, PT, OT, Nurse's Aide           Social Determinants of Health (SDOH) Interventions    Readmission Risk Interventions No flowsheet data found.
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
## 2019-09-01 NOTE — Progress Notes (Signed)
Physical Therapy Treatment Patient Details Name: Breanna Moore MRN: [id number] DOB: 10/24/1975 Today's Date: 09/01/2019    History of Present Illness Breanna Moore is a 44 y.o. female presenting with dyspnea. PMH is significant for HTN, OSA, obesity and major depression.    PT Comments    Co treatment session with OT due to pt decreased safety awareness. Pt sitting up on BSC on entry. Pt standing with min guard due to impulsive movements, for instance pt tried to put on her socks in standing and performed pericare by stepping R LE up on BSC to improve access. After this activity pt becomes fatigued and requires sitting rest break. Pt requires min guard and maximal verbal cuing for walking 8 feet with RW to sink to wash hands and brush her teeth. Pt with c/o of dizziness however BP WNL and SaO2 on 3L >90%O2. D/c plans remain appropriate. PT will continue to follow acutely.    Follow Up Recommendations  SNF;Supervision for mobility/OOB     Equipment Recommendations  Rolling walker with 5" wheels;3in1 (PT)(needs bariatric equipment)       Precautions / Restrictions Precautions Precautions: Fall Precaution Comments: Monitor SpO2 Restrictions Weight Bearing Restrictions: No    Mobility  Bed Mobility               General bed mobility comments: Pt seated on BSC upon OT arrival  Transfers Overall transfer level: Needs assistance Equipment used: Rolling walker (2 wheeled) Transfers: Sit to/from [facility] Sit to Stand: Min guard Stand pivot transfers: Min guard       General transfer comment: To ensure balance and safety. Cues for safety  Ambulation/Gait Ambulation/Gait assistance: Min guard Gait Distance (Feet): 8 Feet Assistive device: 2 person hand held assist;Rolling walker (2 wheeled) Gait Pattern/deviations: Step-to pattern;Trunk flexed;Decreased step length - right;Decreased step length - left;Wide base of support Gait velocity: decr Gait  velocity interpretation: <1.31 ft/sec, indicative of household ambulator General Gait Details: Pt continues to ambulate with decreased safety, attempted to use RW however pt reaching over to sink while pushing RW        Balance Overall balance assessment: Needs assistance Sitting-balance support: Feet supported Sitting balance-Leahy Scale: Good       Standing balance-Leahy Scale: Fair                              Cognition Arousal/Alertness: Lethargic;Awake/alert Behavior During Therapy: Impulsive(Decreased safety awareness) Overall Cognitive Status: Impaired/Different from baseline Area of Impairment: Safety/judgement                         Safety/Judgement: Decreased awareness of safety;Decreased awareness of deficits     General Comments: Pt's mother present and reports to therapist that pt's mentation is improving however continues to be confused.          General Comments General comments (skin integrity, edema, etc.): Pt on 3L [place] with SpO2 maintaining in 90s throughout. Pt reported dizziness in standing. BP sitting: 133/27mmHg; BP standing: 142/3mmHg      Pertinent Vitals/Pain Pain Assessment: No/denies pain           PT Goals (current goals can now be found in the care plan section) Acute Rehab PT Goals PT Goal Formulation: With patient Time For Goal Achievement: 09/13/19 Potential to Achieve Goals: Fair Progress towards PT goals: Progressing toward goals    Frequency    Min 3X/week  PT Plan Current plan remains appropriate    Co-evaluation PT/OT/SLP Co-Evaluation/Treatment: Yes Reason for Co-Treatment: Necessary to address cognition/behavior during functional activity PT goals addressed during session: Mobility/safety with mobility OT goals addressed during session: ADL's and self-care      AM-PAC PT "6 Clicks" Mobility   Outcome Measure  Help needed turning from your back to your side while in a flat bed without  using bedrails?: A Lot Help needed moving from lying on your back to sitting on the side of a flat bed without using bedrails?: A Lot Help needed moving to and from a bed to a chair (including a wheelchair)?: A Little Help needed standing up from a chair using your arms (e.g., wheelchair or bedside chair)?: A Little Help needed to walk in hospital room?: A Lot Help needed climbing 3-5 steps with a railing? : Total 6 Click Score: 13    End of Session Equipment Utilized During Treatment: Oxygen Activity Tolerance: Patient limited by fatigue Patient left: Other (comment);with call bell/phone within reach(sitting at sink for mother to do her hair) Nurse Communication: Mobility status PT Visit Diagnosis: Repeated falls (R29.6);Other abnormalities of gait and mobility (R26.89);Unsteadiness on feet (R26.81);Muscle weakness (generalized) (M62.81);Difficulty in walking, not elsewhere classified (R26.2)     Time: 0258-5277 PT Time Calculation (min) (ACUTE ONLY): 25 min  Charges:  $Gait Training: 8-22 mins                     Breanna Moore PT, DPT Acute Rehabilitation Services Pager [phone] Office [phone]    Elon Alas [facility] 09/01/2019, 3:33 PM

## 2019-09-01 NOTE — Progress Notes (Signed)
Occupational Therapy Treatment Patient Details Name: Breanna Moore MRN: [id number] DOB: 03/20/1976 Today's Date: 09/01/2019    History of present illness Breanna Moore is a 44 y.o. female presenting with dyspnea. PMH is significant for HTN, OSA, obesity and major depression.   OT comments  Cotx with PT to maximize pt safety and functional performance. Pt making progress in therapy demonstrating improved activity tolerance this date. Pt completed toileting task with use of BSC requiring cues for safety during peri care as pt propped LE up on commode to clean. Pt ambulated short distance to bedroom sink with RW and cues for body positioning and safety. Pt tolerated standing 5 min to complete grooming/hygiene tasks. 0 instances of LOB, however pt unsteady on feet. Pt on 3L [place] with SpO2 maintaining in 90s throughout. OT will continue to follow acutely. Recommend SNF placement for additional rehab prior to discharge home as pt is unable to safely and independently care for herself at this time.    Follow Up Recommendations  SNF;Supervision/Assistance - 24 hour    Equipment Recommendations  3 in 1 bedside commode;Tub/shower bench    Recommendations for Other Services      Precautions / Restrictions Precautions Precautions: Fall Precaution Comments: Monitor SpO2 Restrictions Weight Bearing Restrictions: No       Mobility Bed Mobility               General bed mobility comments: Pt seated on BSC upon OT arrival  Transfers Overall transfer level: Needs assistance Equipment used: Rolling walker (2 wheeled) Transfers: Sit to/from [employer] Sit to Stand: Min guard Stand pivot transfers: Min guard       General transfer comment: To ensure balance and safety. Cues for safety    Balance Overall balance assessment: Needs assistance Sitting-balance support: Feet supported Sitting balance-Leahy Scale: Good       Standing balance-Leahy Scale: Fair                              ADL either performed or assessed with clinical judgement   ADL Overall ADL's : Needs assistance/impaired     Grooming: Wash/dry hands;Wash/dry face;Oral care;Min guard;Standing Grooming Details (indicate cue type and reason): While standing at the sink Upper Body Bathing: Min guard;Standing Upper Body Bathing Details (indicate cue type and reason): While standing at the sink Lower Body Bathing: Min guard Lower Body Bathing Details (indicate cue type and reason): While standing. Poor safety awareness. Pt propping leg up BSC to clean peri area.      Lower Body Dressing: Min guard Lower Body Dressing Details (indicate cue type and reason): While standing. Educated pt on safety strategies for task with pt not receptive.  Toilet Transfer: Emergency planning/management officer and Hygiene: Min guard;Sit to/from stand       Functional mobility during ADLs: Min guard;Rolling walker General ADL Comments: Pt able to transfer to/from [facility] and ambulated to bedroom sink with min guard. Pt tolerated standing 5 min at the sink to complete hygiene tasks.      Vision       Perception     Praxis      Cognition Arousal/Alertness: Lethargic;Awake/alert Behavior During Therapy: Impulsive(Decreased safety awareness) Overall Cognitive Status: Impaired/Different from baseline Area of Impairment: Safety/judgement                         Safety/Judgement: Decreased awareness of safety;Decreased awareness of  deficits     General Comments: Pt's mother present and reports to therapist that pt's mentation is improving however continues to be confused.         Exercises     Shoulder Instructions       General Comments Pt on 3L [place] with SpO2 maintaining in 90s throughout. Pt reported dizziness in standing. BP sitting: 133/32mmHg; BP standing: 142/17mmHg    Pertinent Vitals/ Pain       Pain Assessment: No/denies pain  Home Living                                           Prior Functioning/Environment              Frequency           Progress Toward Goals  OT Goals(current goals can now be found in the care plan section)  Progress towards OT goals: Progressing toward goals  ADL Goals Pt Will Perform Grooming: with supervision;standing Pt Will Perform Lower Body Bathing: with supervision;sit to/from stand Pt Will Perform Lower Body Dressing: with supervision;sit to/from stand Pt Will Transfer to Toilet: with supervision;regular height toilet;ambulating Pt Will Perform Toileting - Clothing Manipulation and hygiene: with supervision;sit to/from stand Additional ADL Goal #1: Pt to tolerate standing up to 5 min with supervision, in preparation for ADLs. Additional ADL Goal #2: Pt to recall and verbalize 3 energy conservation strategies with 0 verbal cues. Additional ADL Goal #3: Pt to recall and verbalize 3 fall prevention strategies with 0 verbal cues.  Plan Discharge plan needs to be updated    Co-evaluation    PT/OT/SLP Co-Evaluation/Treatment: Yes Reason for Co-Treatment: Necessary to address cognition/behavior during functional activity;For patient/therapist safety;To address functional/ADL transfers   OT goals addressed during session: ADL's and self-care      AM-PAC OT "6 Clicks" Daily Activity     Outcome Measure   Help from another person eating meals?: A Little Help from another person taking care of personal grooming?: A Little Help from another person toileting, which includes using toliet, bedpan, or urinal?: A Little Help from another person bathing (including washing, rinsing, drying)?: A Little Help from another person to put on and taking off regular upper body clothing?: A Little Help from another person to put on and taking off regular lower body clothing?: A Little 6 Click Score: 18    End of Session Equipment Utilized During Treatment: Oxygen;Rolling walker  OT Visit  Diagnosis: Unsteadiness on feet (R26.81);Muscle weakness (generalized) (M62.81)   Activity Tolerance Patient limited by fatigue;Patient limited by lethargy   Patient Left Other (comment)(Seated on BSC in front of sink. Pt's mother to do her hair. )   Nurse Communication Mobility status(CNA aware of pt sitting at sink)        Time: 1130-1156 OT Time Calculation (min): 26 min  Charges: OT General Charges $OT Visit: 1 Visit OT Treatments $Self Care/Home Management : 8-22 mins  Mauri Brooklyn OTR/L [phone]   Mauri Brooklyn 09/01/2019, 1:18 PM

## 2019-09-02 DIAGNOSIS — F17210 Nicotine dependence, cigarettes, uncomplicated: Secondary | ICD-10-CM

## 2019-09-02 DIAGNOSIS — E041 Nontoxic single thyroid nodule: Secondary | ICD-10-CM

## 2019-09-02 LAB — BASIC METABOLIC PANEL
Anion gap: 11 (ref 5–15)
BUN: 18 mg/dL (ref 6–20)
CO2: 36 mmol/L — ABNORMAL HIGH (ref 22–32)
Calcium: 9.4 mg/dL (ref 8.9–10.3)
Chloride: 93 mmol/L — ABNORMAL LOW (ref 98–111)
Creatinine, Ser: 0.9 mg/dL (ref 0.44–1.00)
GFR calc Af Amer: >60 mL/min (ref >60)
GFR calc non Af Amer: >60 mL/min (ref >60)
Glucose, Bld: 85 mg/dL (ref 70–99)
Potassium: 4.3 mmol/L (ref 3.5–5.1)
Sodium: 140 mmol/L (ref 135–145)

## 2019-09-02 LAB — CBC
HCT: 41.2 % (ref 36.0–46.0)
Hemoglobin: 10.5 g/dL — ABNORMAL LOW (ref 12.0–15.0)
MCH: 17.9 pg — ABNORMAL LOW (ref 26.0–34.0)
MCHC: 25.5 g/dL — ABNORMAL LOW (ref 30.0–36.0)
MCV: 70.1 fL — ABNORMAL LOW (ref 80.0–100.0)
Platelets: 468 10*3/uL — ABNORMAL HIGH (ref 150–400)
RBC: 5.88 MIL/uL — ABNORMAL HIGH (ref 3.87–5.11)
RDW: 22.4 % — ABNORMAL HIGH (ref 11.5–15.5)
WBC: 14 10*3/uL — ABNORMAL HIGH (ref 4.0–10.5)
nRBC: 0.4 % — ABNORMAL HIGH (ref 0.0–0.2)

## 2019-09-02 MED ORDER — IPRATROPIUM-ALBUTEROL 0.5-2.5 (3) MG/3ML IN SOLN: 3.0000 mL | RESPIRATORY_TRACT | Status: DC | PRN

## 2019-09-02 NOTE — TOC Progression Note (Signed)
Transition of Care [facility]) - Progression Note    Patient Details  Name: Breanna Moore MRN: [id number] Date of Birth: 1975-08-20  [facility] CM/SW Contact  Terrial Rhodes, LCSWA Phone Number: 09/02/2019, 1:32 PM  Clinical Narrative:     CSW spoke with patient at bedside. Patient has chosen to go to [place] for SNF placement. CSW spoke with facility and they confirmed that they will accept patient. Facility will get insurance authorization for patient.  PASARR number pending and insurance authorization pending.  CSW will continue to follow.    Expected Discharge Plan: Skilled Nursing Facility Barriers to Discharge: Continued Medical Work up  Expected Discharge Plan and Services Expected Discharge Plan: Skilled Nursing Facility In-house Referral: NA Discharge Planning Services: CM Consult Post Acute Care Choice: Home Health Living arrangements for the past 2 months: Single Family Home                 DME Arranged: Continuous positive airway pressure (CPAP) DME Agency: Other - Comment([facility]) Date DME Agency Contacted: 08/30/19 Time DME Agency Contacted: 1130 Representative spoke with at DME Agency: Fayrene Fearing HH Arranged: RN, Disease Management, PT, OT, Nurse's Aide           Social Determinants of Health (SDOH) Interventions    Readmission Risk Interventions No flowsheet data found.

## 2019-09-02 NOTE — Progress Notes (Signed)
Patient is not liking her new home cpap machine. It is set to a max pressure of 20 and a minimum pressure of 6. It is not going any higher than 6 no matter how she breathes. She says she does not feel that she is getting enough air. We called her home care company to have them help [place] turn off the ramp. It still stayed at 6. She said she liked the hospital dream station machine and would like to go back on it. I checked the settings on the dream station and they are essentially the same. I reminded her that this mode gives pressure based on her need. She agreed to relax and try to fall asleep on the home machine I will check to see if the pressure moves once she is asleep.

## 2019-09-02 NOTE — Progress Notes (Signed)
Patient's BP= 98/60 then on recheck BP=99/60.  Dr. Leary Roca notified. Stated ok holding am dose to lisinopril and HCTz.
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
## 2019-09-03 LAB — BASIC METABOLIC PANEL
Anion gap: 12 (ref 5–15)
BUN: 19 mg/dL (ref 6–20)
CO2: 34 mmol/L — ABNORMAL HIGH (ref 22–32)
Calcium: 9.4 mg/dL (ref 8.9–10.3)
Chloride: 94 mmol/L — ABNORMAL LOW (ref 98–111)
Creatinine, Ser: 0.84 mg/dL (ref 0.44–1.00)
GFR calc Af Amer: >60 mL/min (ref >60)
GFR calc non Af Amer: >60 mL/min (ref >60)
Glucose, Bld: 81 mg/dL (ref 70–99)
Potassium: 4.5 mmol/L (ref 3.5–5.1)
Sodium: 140 mmol/L (ref 135–145)

## 2019-09-03 LAB — CBC
HCT: 41 % (ref 36.0–46.0)
Hemoglobin: 10.4 g/dL — ABNORMAL LOW (ref 12.0–15.0)
MCH: 18 pg — ABNORMAL LOW (ref 26.0–34.0)
MCHC: 25.4 g/dL — ABNORMAL LOW (ref 30.0–36.0)
MCV: 71.1 fL — ABNORMAL LOW (ref 80.0–100.0)
Platelets: 414 10*3/uL — ABNORMAL HIGH (ref 150–400)
RBC: 5.77 MIL/uL — ABNORMAL HIGH (ref 3.87–5.11)
RDW: 23.1 % — ABNORMAL HIGH (ref 11.5–15.5)
WBC: 14.9 10*3/uL — ABNORMAL HIGH (ref 4.0–10.5)
nRBC: 0.3 % — ABNORMAL HIGH (ref 0.0–0.2)

## 2019-09-03 MED ORDER — SALINE SPRAY 0.65 % NA SOLN
1.0000 | NASAL | Status: DC | PRN
  Administered 2019-09-03: 1 via NASAL
  Filled 2019-09-03 (×2): qty 44

## 2019-09-03 MED ORDER — LORATADINE 10 MG PO TABS
10.0000 mg | ORAL_TABLET | Freq: Every day | ORAL | Status: DC | PRN
  Administered 2019-09-03 – 2019-09-07 (×5): 10 mg via ORAL
  Filled 2019-09-03 (×6): qty 1

## 2019-09-03 NOTE — Progress Notes (Signed)
Patient is asleep on her home CPAP. The pressure is reading 13.3. Her vital signs are all within normal limits.

## 2019-09-03 NOTE — TOC Progression Note (Signed)
Transition of Care [facility]) - Progression Note    Patient Details  Name: Vonna Brabson MRN: [id number] Date of Birth: 20-Jan-1976  [facility]) CM/SW Contact  Terrial Rhodes, LCSWA Phone Number: 09/03/2019, 2:16 PM  Clinical Narrative:     CSW spoke with patient at bedside who agreed to SNF placement at [facility]. CSW spoke with Jill Side from [place] who has started insurance authorization for patient. CSW spoke with [place] must and PASRR number is still under review. We will most likely hear an answer back by Monday.   PASRR number pending. Insurance authorization pending. Patient has bed at [facility].  TOC team will continue to follow.   Expected Discharge Plan: Skilled Nursing Facility Barriers to Discharge: Continued Medical Work up  Expected Discharge Plan and Services Expected Discharge Plan: Skilled Nursing Facility In-house Referral: NA Discharge Planning Services: CM Consult Post Acute Care Choice: Home Health Living arrangements for the past 2 months: Single Family Home                 DME Arranged: Continuous positive airway pressure (CPAP) DME Agency: Other - Comment([facility]) Date DME Agency Contacted: 08/30/19 Time DME Agency Contacted: 1130 Representative spoke with at DME Agency: Fayrene Fearing HH Arranged: RN, Disease Management, PT, OT, Nurse's Aide           Social Determinants of Health (SDOH) Interventions    Readmission Risk Interventions No flowsheet data found.
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
## 2019-09-03 NOTE — Plan of Care (Signed)

## 2019-09-03 NOTE — Progress Notes (Signed)
Physical Therapy Treatment Patient Details Name: Breanna Moore MRN: [id number] DOB: Mar 27, 1976 Today's Date: 09/03/2019    History of Present Illness Ermelinda Eckert is a 44 y.o. female presenting with dyspnea. PMH is significant for HTN, OSA, obesity and major depression.    PT Comments    Pt up in chair agreeable to therapy. Pt with improved understanding of her limitation and safety awareness today. Pt continues to be limited in safe mobility by increased oxygen demand in presence of decreased strength, balance and endurance. Pt is min guard for transfers and ambulation of 11 feet with RW before needing seated rest break.  D/c plans remain appropriate at this time. PT will continue to follow acutely.    Follow Up Recommendations  SNF;Supervision for mobility/OOB     Equipment Recommendations  Rolling walker with 5" wheels;3in1 (PT)(needs bariatric equipment)       Precautions / Restrictions Precautions Precautions: Fall Precaution Comments: Monitor SpO2 Restrictions Weight Bearing Restrictions: No    Mobility  Bed Mobility               General bed mobility comments: sitting in recliner on entry   Transfers Overall transfer level: Needs assistance Equipment used: Rolling walker (2 wheeled) Transfers: Sit to/from [facility] Sit to Stand: Min guard Stand pivot transfers: Min guard       General transfer comment: To ensure balance and safety. Cues for safety  Ambulation/Gait Ambulation/Gait assistance: Min guard Gait Distance (Feet): 22 Feet(2x11) Assistive device: Rolling walker (2 wheeled) Gait Pattern/deviations: Step-to pattern;Trunk flexed;Decreased step length - right;Decreased step length - left;Wide base of support Gait velocity: decr Gait velocity interpretation: <1.31 ft/sec, indicative of household ambulator General Gait Details: Pt able to ambulate from [facility] out door of room and then requests to sit, after sitting 3 min pt able to walk  back to recliner          Balance Overall balance assessment: Needs assistance Sitting-balance support: Feet supported Sitting balance-Leahy Scale: Good       Standing balance-Leahy Scale: Fair                              Cognition Arousal/Alertness: Awake/alert Behavior During Therapy: Impulsive(Decreased safety awareness) Overall Cognitive Status: Impaired/Different from baseline Area of Impairment: Safety/judgement                         Safety/Judgement: Decreased awareness of safety;Decreased awareness of deficits     General Comments: Pt safety awareness is improving. Pt reports "Sometimes my mind says I can do something but my body says I can't"          General Comments General comments (skin integrity, edema, etc.): Pt on 2L O2 with SaO2 >90%O2 throughout session       Pertinent Vitals/Pain Pain Assessment: No/denies pain           PT Goals (current goals can now be found in the care plan section) Acute Rehab PT Goals PT Goal Formulation: With patient Time For Goal Achievement: 09/13/19 Potential to Achieve Goals: Fair Progress towards PT goals: Progressing toward goals    Frequency    Min 3X/week      PT Plan Current plan remains appropriate       AM-PAC PT "6 Clicks" Mobility   Outcome Measure  Help needed turning from your back to your side while in a flat bed without using bedrails?: A Lot  Help needed moving from lying on your back to sitting on the side of a flat bed without using bedrails?: A Lot Help needed moving to and from a bed to a chair (including a wheelchair)?: A Little Help needed standing up from a chair using your arms (e.g., wheelchair or bedside chair)?: A Little Help needed to walk in hospital room?: A Lot Help needed climbing 3-5 steps with a railing? : Total 6 Click Score: 13    End of Session Equipment Utilized During Treatment: Oxygen Activity Tolerance: Patient tolerated treatment  well Patient left: with call bell/phone within reach;in chair Nurse Communication: Mobility status PT Visit Diagnosis: Repeated falls (R29.6);Other abnormalities of gait and mobility (R26.89);Unsteadiness on feet (R26.81);Muscle weakness (generalized) (M62.81);Difficulty in walking, not elsewhere classified (R26.2)     Time: 0094-1791 PT Time Calculation (min) (ACUTE ONLY): 18 min  Charges:  $Gait Training: 8-22 mins                     Jacson Rapaport B. Beverely Risen PT, DPT Acute Rehabilitation Services Pager [phone] Office [phone]    Elon Alas Fleet 09/03/2019, 1:02 PM

## 2019-09-04 DIAGNOSIS — E041 Nontoxic single thyroid nodule: Secondary | ICD-10-CM

## 2019-09-04 LAB — CBC
HCT: 42.2 % (ref 36.0–46.0)
Hemoglobin: 10.9 g/dL — ABNORMAL LOW (ref 12.0–15.0)
MCH: 18.3 pg — ABNORMAL LOW (ref 26.0–34.0)
MCHC: 25.8 g/dL — ABNORMAL LOW (ref 30.0–36.0)
MCV: 70.8 fL — ABNORMAL LOW (ref 80.0–100.0)
Platelets: 392 10*3/uL (ref 150–400)
RBC: 5.96 MIL/uL — ABNORMAL HIGH (ref 3.87–5.11)
RDW: 24 % — ABNORMAL HIGH (ref 11.5–15.5)
WBC: 11.3 10*3/uL — ABNORMAL HIGH (ref 4.0–10.5)
nRBC: 0 % (ref 0.0–0.2)

## 2019-09-04 LAB — BASIC METABOLIC PANEL
Anion gap: 15 (ref 5–15)
BUN: 21 mg/dL — ABNORMAL HIGH (ref 6–20)
CO2: 30 mmol/L (ref 22–32)
Calcium: 9.3 mg/dL (ref 8.9–10.3)
Chloride: 92 mmol/L — ABNORMAL LOW (ref 98–111)
Creatinine, Ser: 0.87 mg/dL (ref 0.44–1.00)
GFR calc Af Amer: >60 mL/min (ref >60)
GFR calc non Af Amer: >60 mL/min (ref >60)
Glucose, Bld: 89 mg/dL (ref 70–99)
Potassium: 4.4 mmol/L (ref 3.5–5.1)
Sodium: 137 mmol/L (ref 135–145)

## 2019-09-04 NOTE — Progress Notes (Signed)
Family Medicine Teaching Service Daily Progress Note Intern Pager: [phone]  Patient name: Breanna Moore Medical record number: [id number] Date of birth: 1976/04/28 Age: 44 y.o. Gender: female  Primary Care Provider: [facility], [place] Consultants: None Code Status: FULL  Pt Overview and Major Events to Date:  5/1 Admitted to FPTS  Assessment and Plan: Breanna Moore is a 44 y.o. female presenting with dyspnea. PMH is significant for HTN, OSA, obesity and major depression.  Hypercarbic respiratory failure likely 2/2 OSA, OHS Stable from respiratory standpoint.  On 2 L nasal cannula.  Tolerated CPAP well last night.  At this point patient is awaiting placement at a SNF.  PASRR submitted has not been approved yet.  Patient is medically stable for discharge at this time. -Vital signs per floor routine -Continuous pulse ox -DuoNebs every 6 hours as needed -CPAP at bedtime -OT/PT -Social work to assist with SNF placement  Stress incontinence Patient with with symptoms of stress incontinence which includes some dribbling/leakage of urine while sneezing and coughing.  Has had a couple of children and states that she believes the symptoms started all those years ago when her second child was born.  Recommended trying Kegel exercises and follow-up with her PCP for this issue.  Could possibly consider pelvic floor rehab as an outpatient but would defer to PCP. -Recommend trying Kegel exercises -Follow-up with PCP  Microcytic anemia Hemoglobin 10.9, MCV 70.8.  Continue to monitor with daily CBC.  Ferritin 4, sat ratio 7.  - Continue to monitor - ferrous sulfate 325 mg daily  Enlarged thyroid Noted to have a enlargement of her thyroid lobes L>R.  Will need annual follow-up with ultrasound and continue to monitor for any symptoms. -Continue to monitor for any changes -Follow up annually with [place]   Thoracic Aortic Aneurysm Incidentally noted on CTA from 5/1, size is 4.4 cm.  Recommend follow up with vascular surgery for annual exams. - follow up with PCP for annual imaging via CTA or MRA   Major depression   Normal mood, normal affect, denies SI. No previous suicide attempts. Depression is currently treated and well controlled. Home meds: Abilify 5 mg once at bedtime, Prozac 20 mg once at bedtime, Hydroxyzine 10 mg Q3 -Continue home medications   Hypertension Normotensive today: 116/68 Home meds: Lisinopril 10 mg daily, HCTZ 25 mg daily. Reports misses 1-2 doses per week.  -Continue lisinopril and HCTZ  FEN/GI: Regular diet Prophylaxis: Lovenox   Disposition: Pending SNF placement   Subjective:  Much improved.  Awaiting SNF placement.  Did discuss symptoms of stress incontinence this morning.  States that she has been more active this morning and has been able to "get up and do bit more".  Objective: Temp:  [98 F (36.7 C)-98.5 F (36.9 C)] 98.5 F (36.9 C) (05/08 0355) Pulse Rate:  [88-99] 88 (05/08 0355) Resp:  [18-20] 20 (05/08 0355) BP: (116-121)/(68-75) 116/68 (05/08 0355) SpO2:  [95 %-97 %] 97 % (05/08 0355) Weight:  [177.8 kg] 177.8 kg (05/08 0355)  Physical Exam: General: Very pleasant 44 year old Afro-American woman, no acute distress, standing at sink and walking around room during exam Cardiac: Regular rate and rhythm, no M/R/G Respiratory: Lungs clear to auscultation bilaterally, no accessory muscle use.  On 2 L nasal cannula Extremities: Skin warm and dry, no lower extremity edema Neuro: No gait abnormality, no focal neurologic deficits appreciated Psych: Very pleasant, normal mood and affect  Laboratory: Recent Labs  Lab 09/02/19 0328 09/03/19 0415 09/04/19 0514  WBC  14.0* 14.9* 11.3*  HGB 10.5* 10.4* 10.9*  HCT 41.2 41.0 42.2  PLT 468* 414* 392   Recent Labs  Lab 09/02/19 0328 09/03/19 0415 09/04/19 0514  NA 140 140 137  K 4.3 4.5 4.4  CL 93* 94* 92*  CO2 36* 34* 30  BUN 18 19 21*  CREATININE 0.90 0.84 0.87   CALCIUM 9.4 9.4 9.3  GLUCOSE 85 81 89    Imaging/Diagnostic Tests: ECHO IMPRESSIONS  1. Moderate dilatation of the right ventricle and right atrium with RV  strain suspicious for a pulmonary embolism or other lung pathology.  2. Left ventricular ejection fraction, by estimation, is 65 to 70%. The  left ventricle has normal function. The left ventricle has no regional  wall motion abnormalities. There is severe concentric left ventricular  hypertrophy. Left ventricular diastolic  parameters are consistent with Grade II diastolic dysfunction  (pseudonormalization).  3. Right ventricular systolic function is mildly reduced. The right  ventricular size is moderately enlarged.  4. Left atrial size was mildly dilated.  5. Right atrial size was moderately dilated.  6. The mitral valve is normal in structure. No evidence of mitral valve  regurgitation. No evidence of mitral stenosis.  7. The aortic valve is normal in structure. Aortic valve regurgitation is  not visualized. No aortic stenosis is present.  8. The inferior vena cava is normal in size with greater than 50%  respiratory variability, suggesting right atrial pressure of 3 mmHg.  THYROID ULTRASOUND COMPARISON:  CTA of the chest on 08/28/2018 No abnormal lymph nodes identified. IMPRESSION: 1. 2 cm right superior thyroid nodule (nodule 1) meets criteria for 1 year follow-up ultrasound. 2. 4.8 cm solid and cystic left inferior thyroid nodule (nodule 2) technically does not meet criteria for follow-up. However, given its size, 1 year follow-up ultrasound is felt to be appropriate at the time of follow-up of the other nodule.    Myrene Buddy, MD 09/04/2019, 7:41 AM PGY-3, [place] [facility] FPTS Intern pager: [phone], text pages welcome

## 2019-09-05 NOTE — Progress Notes (Signed)
Family Medicine Teaching Service Daily Progress Note Intern Pager: [phone]  Patient name: Breanna Moore Medical record number: [id number] Date of birth: 07-09-1975 Age: 44 y.o. Gender: female  Primary Care Provider: [facility], [place] Consultants: None Code Status: FULL  Pt Overview and Major Events to Date:  5/1 Admitted to FPTS  Assessment and Plan: Breanna Moore is a 44 y.o. female presenting with dyspnea. PMH is significant for HTN, OSA, obesity and major depression.  Hypercarbic respiratory failure likely 2/2 OSA, OHS Stable from respiratory standpoint.  Able to wean O2 to RA this morning while sitting, SpO2 >90% on RA.  Tolerated CPAP well last night.  At this point patient is awaiting placement at a SNF.  PASRR submitted has not been approved yet.  Patient is medically stable for discharge at this time. -Vital signs per floor routine -Continuous pulse ox -DuoNebs every 6 hours as needed -CPAP at bedtime -OT/PT -Social work to assist with SNF placement  Stress incontinence Patient with with symptoms of stress incontinence which includes some dribbling/leakage of urine while sneezing and coughing, still present today.  Has had a couple of children and states that she believes the symptoms started all those years ago when her second child was born.  Recommended trying Kegel exercises and follow-up with her PCP for this issue.  Could possibly consider pelvic floor rehab as an outpatient but would defer to PCP. -Recommend trying Kegel exercises -Follow-up with PCP  Microcytic anemia Hemoglobin 10.4, MCV 70.8.  Continue to monitor every other day with CBC.  Ferritin 4, sat ratio 7.  - Continue to monitor qod - ferrous sulfate 325 mg daily  Enlarged thyroid Noted to have a enlargement of her thyroid lobes L>R.  Will need annual follow-up with ultrasound and continue to monitor for any symptoms. -Continue to monitor for any changes -Follow up annually with [place]    Thoracic Aortic Aneurysm Incidentally noted on CTA from 5/1, size is 4.4 cm. Recommend follow up with vascular surgery for annual exams. - follow up with PCP for annual imaging via CTA or MRA   Major depression   Normal mood, normal affect, denies SI. No previous suicide attempts. Depression is currently treated and well controlled. Home meds: Abilify 5 mg once at bedtime, Prozac 20 mg once at bedtime, Hydroxyzine 10 mg Q3 -Continue home medications   Hypertension Normotensive today: 114/68 Home meds: Lisinopril 10 mg daily, HCTZ 25 mg daily. Reports misses 1-2 doses per week.  -Continue lisinopril and HCTZ  FEN/GI: Regular diet Prophylaxis: Lovenox   Disposition: Pending SNF placement   Subjective:  Patient continues to feel a little bit better everyday, states that she feels more like herself. Continuing to use home CPAP nightly. Getting up and moving around more, able to get to bathroom with walker. Awaiting SNF.  Objective: Temp:  [98.1 F (36.7 C)-98.5 F (36.9 C)] 98.5 F (36.9 C) (05/09 0425) Pulse Rate:  [88-95] 88 (05/09 0425) Resp:  [15-20] 20 (05/09 0425) BP: (101-114)/(68-79) 114/68 (05/09 0425) SpO2:  [95 %-98 %] 98 % (05/09 0425) Weight:  [177.5 kg] 177.5 kg (05/09 0425)  Physical Exam: General: Very pleasant 44 year old Afro-American woman, no acute distress, sitting in chair eating breakfast Cardiac: Regular rate and rhythm, no M/R/G Respiratory: Lungs clear to auscultation bilaterally, no accessory muscle use.  On RA. Extremities: Skin warm and dry, no lower extremity edema Neuro: no focal neurologic deficits appreciated Psych: Very pleasant, normal mood and affect  Laboratory: Recent Labs  Lab 09/02/19 0328  09/03/19 0415 09/04/19 0514  WBC 14.0* 14.9* 11.3*  HGB 10.5* 10.4* 10.9*  HCT 41.2 41.0 42.2  PLT 468* 414* 392   Recent Labs  Lab 09/02/19 0328 09/03/19 0415 09/04/19 0514  NA 140 140 137  K 4.3 4.5 4.4  CL 93* 94* 92*  CO2 36*  34* 30  BUN 18 19 21*  CREATININE 0.90 0.84 0.87  CALCIUM 9.4 9.4 9.3  GLUCOSE 85 81 89    Imaging/Diagnostic Tests: ECHO IMPRESSIONS  1. Moderate dilatation of the right ventricle and right atrium with RV  strain suspicious for a pulmonary embolism or other lung pathology.  2. Left ventricular ejection fraction, by estimation, is 65 to 70%. The  left ventricle has normal function. The left ventricle has no regional  wall motion abnormalities. There is severe concentric left ventricular  hypertrophy. Left ventricular diastolic  parameters are consistent with Grade II diastolic dysfunction  (pseudonormalization).  3. Right ventricular systolic function is mildly reduced. The right  ventricular size is moderately enlarged.  4. Left atrial size was mildly dilated.  5. Right atrial size was moderately dilated.  6. The mitral valve is normal in structure. No evidence of mitral valve  regurgitation. No evidence of mitral stenosis.  7. The aortic valve is normal in structure. Aortic valve regurgitation is  not visualized. No aortic stenosis is present.  8. The inferior vena cava is normal in size with greater than 50%  respiratory variability, suggesting right atrial pressure of 3 mmHg.  THYROID ULTRASOUND COMPARISON:  CTA of the chest on 08/28/2018 No abnormal lymph nodes identified. IMPRESSION: 1. 2 cm right superior thyroid nodule (nodule 1) meets criteria for 1 year follow-up ultrasound. 2. 4.8 cm solid and cystic left inferior thyroid nodule (nodule 2) technically does not meet criteria for follow-up. However, given its size, 1 year follow-up ultrasound is felt to be appropriate at the time of follow-up of the other nodule.    Shirlean Mylar, MD 09/05/2019, 8:11 AM PGY-1, [facility] Family Medicine FPTS Intern pager: [phone], text pages welcome

## 2019-09-05 NOTE — Plan of Care (Signed)
  Problem: Health Behavior/Discharge Planning: Goal: Ability to manage health-related needs will improve Outcome: Progressing   Problem: Clinical Measurements: Goal: Will remain free from infection Outcome: Progressing Goal: Diagnostic test results will improve Outcome: Progressing Goal: Respiratory complications will improve Outcome: Progressing   Problem: Education: Goal: Knowledge of General Education information will improve Description: Including pain rating scale, medication(s)/side effects and non-pharmacologic comfort measures Outcome: Completed/Met   Problem: Clinical Measurements: Goal: Ability to maintain clinical measurements within normal limits will improve Outcome: Completed/Met   Problem: Nutrition: Goal: Adequate nutrition will be maintained Outcome: Completed/Met   Problem: Coping: Goal: Level of anxiety will decrease Outcome: Completed/Met   Problem: Elimination: Goal: Will not experience complications related to urinary retention Outcome: Completed/Met   Problem: Pain Managment: Goal: General experience of comfort will improve Outcome: Completed/Met

## 2019-09-05 NOTE — Progress Notes (Signed)
Patients oxygen saturation today has been 94% and above on continuous monitoring. Spoke with central telemetry and it was reported that they noted her saturations to be 94% and above. Will continue checking oxygen saturation with vital signs, but not continuous  Patient has been up in the room washing and dressing. Feels tired and weak but has maintained good oxygenation on room air.

## 2019-09-06 LAB — CBC
HCT: 38.4 % (ref 36.0–46.0)
Hemoglobin: 10.1 g/dL — ABNORMAL LOW (ref 12.0–15.0)
MCH: 18.9 pg — ABNORMAL LOW (ref 26.0–34.0)
MCHC: 26.3 g/dL — ABNORMAL LOW (ref 30.0–36.0)
MCV: 71.9 fL — ABNORMAL LOW (ref 80.0–100.0)
Platelets: 424 10*3/uL — ABNORMAL HIGH (ref 150–400)
RBC: 5.34 MIL/uL — ABNORMAL HIGH (ref 3.87–5.11)
RDW: 24.6 % — ABNORMAL HIGH (ref 11.5–15.5)
WBC: 9.6 10*3/uL (ref 4.0–10.5)
nRBC: 0 % (ref 0.0–0.2)

## 2019-09-06 LAB — SARS CORONAVIRUS 2 (TAT 6-24 HRS): SARS Coronavirus 2: NEGATIVE

## 2019-09-06 NOTE — TOC Progression Note (Signed)
Transition of Care [facility]) - Progression Note    Patient Details  Name: Breanna Moore MRN: [id number] Date of Birth: 1975-09-02  Transition of Care [facility]) CM/SW Contact  Graves-Bigelow, Lamar Laundry, RN Phone Number: 09/06/2019, 4:56 PM  Clinical Narrative:  Case Manager spoke with patient and she has declined SNF at [place]. [facility] can no longer accept the patient at this time. Case Manager spoke with patient and her mother regarding home health services. Patient may only be eligible for Physical Therapy services because she will have to pay for each discipline. Medicare.gov list provided to patient. Patient undecided if she will go to [facility] vs stay in [place] at this time. Case Manager called [facility]-unable to accept BCBS, [facility] to staff, [facility]- not in network, [facility]- awaiting call back, [facility]- unable to staff, [facility]- not in network [facility]- not in network, [facility]- only takes Medicaid. Patient is currently in the process of calling her insurance company [employer]- affiliated with [facility] plan to see which agencies are in network in [place], [place], and [place] [employer]. Case Manager will follow for home oxygen needs- no orders for home 02 are in Epic at this time. Mother at the bedside- patient to think overnight to see if she needs to transition to [place]. Case Manager will follow up in the morning with the patient.   Expected Discharge Plan: Home w Home Health Services Barriers to Discharge: Continued Medical Work up  Expected Discharge Plan and Services Expected Discharge Plan: Home w Home Health Services In-house Referral: NA Discharge Planning Services: CM Consult Post Acute Care Choice: Home Health Living arrangements for the past 2 months: Single Family Home                 DME Arranged: Continuous positive airway pressure (CPAP) DME Agency: Other - Comment([facility]) Date DME Agency Contacted: 08/30/19 Time DME Agency Contacted: 1130 Representative spoke with at DME Agency: Fayrene Fearing HH Arranged: RN, Disease Management, PT, OT, Nurse's Aide   Readmission Risk Interventions No flowsheet data found.

## 2019-09-06 NOTE — Progress Notes (Signed)
Nutrition Brief Note  Patient identified on the Malnutrition Screening Tool (MST) Report for weight loss and poor intake. Per review of usual weights below, patient gained weight PTA. Since admission, she has had ~2% weight loss, likely related to fluids, and PO intake has been good.  Wt Readings from Last 15 Encounters:  09/06/19 (!) 177.9 kg  06/17/19 (!) 155.6 kg  10/10/18 (!) 155.6 kg  02/14/18 (!) 166 kg  01/26/18 (!) 166 kg  09/26/17 (!) 179.2 kg  08/25/17 (!) 179.2 kg  08/11/17 (!) 179.2 kg  06/30/17 (!) 176 kg  06/05/17 (!) 176 kg  08/20/15 (!) 151.6 kg    Body mass index is 63.29 kg/m. Patient meets criteria for morbid obesity based on current BMI.   Current diet order is regular, patient is consuming approximately 75% of meals at this time. Labs and medications reviewed. Nursing skin assessment WDL.  No nutrition interventions warranted at this time. If nutrition issues arise, please consult RD.   Gabriel Rainwater, RD, LDN, CNSC Please refer to [facility] for contact information.

## 2019-09-06 NOTE — TOC Progression Note (Signed)
Transition of Care [facility]) - Progression Note    Patient Details  Name: Breanna Moore MRN: [id number] Date of Birth: April 27, 1976  [facility]) CM/SW Contact  Terrial Rhodes, LCSWA Phone Number: 09/06/2019, 1:38 PM  Clinical Narrative:     CSW spoke with Jill Side from [facility] who can no longer accept patient due to not enough staffing at this time. CSW spoke with patient at bedside and explained to patient that there is no bed availability at [facility] at this time.Patient decided to go with home health. CSW followed up with case manager to help get patient set up with home health services.  CSW will continue to follow.  Expected Discharge Plan: Skilled Nursing Facility Barriers to Discharge: Continued Medical Work up  Expected Discharge Plan and Services Expected Discharge Plan: Skilled Nursing Facility In-house Referral: NA Discharge Planning Services: CM Consult Post Acute Care Choice: Home Health Living arrangements for the past 2 months: Single Family Home                 DME Arranged: Continuous positive airway pressure (CPAP) DME Agency: Other - Comment([facility]) Date DME Agency Contacted: 08/30/19 Time DME Agency Contacted: 1130 Representative spoke with at DME Agency: Fayrene Fearing HH Arranged: RN, Disease Management, PT, OT, Nurse's Aide           Social Determinants of Health (SDOH) Interventions    Readmission Risk Interventions No flowsheet data found.

## 2019-09-06 NOTE — Progress Notes (Signed)
Family Medicine Teaching Service Daily Progress Note Intern Pager: [phone]  Patient name: Breanna Moore Medical record number: [id number] Date of birth: March 13, 1976 Age: 44 y.o. Gender: female  Primary Care Provider: Health, [facility] Consultants: None Code Status: FULL  Pt Overview and Major Events to Date:  5/1 Admitted to FPTS  Assessment and Plan: Breanna Moore is a 44 y.o. female presenting with dyspnea. PMH is significant for HTN, OSA, obesity and major depression.  Hypercarbic respiratory failure likely 2/2 OSA, OHS Stable from respiratory standpoint. Patient has hypoxemic events at night requiring 2L O2 [place]. During the day stable on RA. Continuing to tolerate home CPAP well. At this point patient is awaiting placement at a SNF.  PASRR submitted has not been approved yet.  Patient is medically stable for discharge at this time. -Vital signs per floor routine -Continuous pulse ox -DuoNebs every 6 hours as needed -CPAP at bedtime -OT/PT -Social work to assist with SNF placement  Stress incontinence Patient with with symptoms of stress incontinence which includes some dribbling/leakage of urine while sneezing and coughing, still present today.  Has had a couple of children and states that she believes the symptoms started all those years ago when her second child was born.  Recommended trying Kegel exercises and follow-up with her PCP for this issue.  Could possibly consider pelvic floor rehab as an outpatient but would defer to PCP. -Recommend trying Kegel exercises -Follow-up with PCP  Microcytic anemia Hemoglobin 10.1, MCV 70.8.  Continue to monitor every other day with CBC.  Ferritin 4, sat ratio 7. S/p ferraheme x1. - Continue to monitor qod - ferrous sulfate 325 mg daily  Enlarged thyroid Noted to have a enlargement of her thyroid lobes L>R.  Will need annual follow-up with ultrasound and continue to monitor for any symptoms. -Continue to monitor for any  changes -Follow up annually with [place]   Thoracic Aortic Aneurysm Incidentally noted on CTA from 5/1, size is 4.4 cm. Recommend follow up with vascular surgery for annual exams. - follow up with PCP for annual imaging via CTA or MRA   Major depression   Normal mood, normal affect, denies SI. No previous suicide attempts. Depression is currently treated and well controlled. Home meds: Abilify 5 mg once at bedtime, Prozac 20 mg once at bedtime, Hydroxyzine 10 mg Q3 -Continue home medications   Hypertension Normotensive today: 114/68 Home meds: Lisinopril 10 mg daily, HCTZ 25 mg daily. Reports misses 1-2 doses per week.  -Continue lisinopril and HCTZ  FEN/GI: Regular diet Prophylaxis: Lovenox   Disposition: Pending SNF placement   Subjective:  Patient had the most activity yesterday compared to previous weeks. Was up for 12 hours, interacting with church online and her son who visited. Today she is very tired. Continuing to use home CPAP nightly. Getting up and moving around more, able to get to bathroom with walker. Awaiting SNF.  Objective: Temp:  [98 F (36.7 C)-98.5 F (36.9 C)] 98.4 F (36.9 C) (05/10 0525) Pulse Rate:  [90-96] 92 (05/10 0525) Resp:  [18-20] 20 (05/10 0525) BP: (123-137)/(69-84) 123/69 (05/10 0525) SpO2:  [94 %-96 %] 96 % (05/10 0525) Weight:  [177.9 kg] 177.9 kg (05/10 0525)  Physical Exam: General: Very pleasant 44 year old Afro-American woman, no acute distress, sitting in chair Cardiac: Regular rate and rhythm, no M/R/G Respiratory: Lungs clear to auscultation bilaterally, no accessory muscle use.  On RA. Extremities: Skin warm and dry, no lower extremity edema Neuro: no focal neurologic deficits appreciated Psych: Very  pleasant, normal mood and affect  Laboratory: Recent Labs  Lab 09/03/19 0415 09/04/19 0514 09/06/19 0452  WBC 14.9* 11.3* 9.6  HGB 10.4* 10.9* 10.1*  HCT 41.0 42.2 38.4  PLT 414* 392 424*   Recent Labs  Lab 09/02/19 0328  09/03/19 0415 09/04/19 0514  NA 140 140 137  K 4.3 4.5 4.4  CL 93* 94* 92*  CO2 36* 34* 30  BUN 18 19 21*  CREATININE 0.90 0.84 0.87  CALCIUM 9.4 9.4 9.3  GLUCOSE 85 81 89    Imaging/Diagnostic Tests: ECHO IMPRESSIONS  1. Moderate dilatation of the right ventricle and right atrium with RV  strain suspicious for a pulmonary embolism or other lung pathology.  2. Left ventricular ejection fraction, by estimation, is 65 to 70%. The  left ventricle has normal function. The left ventricle has no regional  wall motion abnormalities. There is severe concentric left ventricular  hypertrophy. Left ventricular diastolic  parameters are consistent with Grade II diastolic dysfunction  (pseudonormalization).  3. Right ventricular systolic function is mildly reduced. The right  ventricular size is moderately enlarged.  4. Left atrial size was mildly dilated.  5. Right atrial size was moderately dilated.  6. The mitral valve is normal in structure. No evidence of mitral valve  regurgitation. No evidence of mitral stenosis.  7. The aortic valve is normal in structure. Aortic valve regurgitation is  not visualized. No aortic stenosis is present.  8. The inferior vena cava is normal in size with greater than 50%  respiratory variability, suggesting right atrial pressure of 3 mmHg.  THYROID ULTRASOUND COMPARISON:  CTA of the chest on 08/28/2018 No abnormal lymph nodes identified. IMPRESSION: 1. 2 cm right superior thyroid nodule (nodule 1) meets criteria for 1 year follow-up ultrasound. 2. 4.8 cm solid and cystic left inferior thyroid nodule (nodule 2) technically does not meet criteria for follow-up. However, given its size, 1 year follow-up ultrasound is felt to be appropriate at the time of follow-up of the other nodule.    Breanna Damme, MD 09/06/2019, 6:40 AM PGY-1, [place] Intern pager: [phone], text pages welcome

## 2019-09-07 DIAGNOSIS — R0902 Hypoxemia: Secondary | ICD-10-CM

## 2019-09-07 NOTE — Progress Notes (Signed)
Occupational Therapy Treatment Patient Details Name: Breanna Moore MRN: [id number] DOB: 05-17-1975 Today's Date: 09/07/2019    History of present illness Breanna Moore is a 44 y.o. female presenting with dyspnea. PMH is significant for HTN, OSA, obesity and major depression.   OT comments  Pt seated in recliner upon entry, agreeable and eager to participate in OT.  Patient reports plan to dc home with [facility] services, dcing with her parents who can provide 24/7 supervision.  She currently requires min assist to min guard for LB ADLs, supervision for grooming at sink, and close supervision for transfers/in room mobility using RW.  She remains limited by decreased activity tolerance, endurance and generalized weakness, as well as cognition (safety, problem solving and attention to task). Educated on energy conservation techniques, fall prevention and home safety.  Discussed recommendations for 24/7 assist and assist with all mobility and ADLs at this time.  Pt voiced understanding.  Updated dc plan to [facility] services.  Will follow acutely.    Follow Up Recommendations  Supervision/Assistance - 24 hour;Home health OT(needs maximal HH services (SNF declined))    Equipment Recommendations  3 in 1 bedside commode;Tub/shower bench    Recommendations for Other Services      Precautions / Restrictions Precautions Precautions: Fall Precaution Comments: Monitor SpO2       Mobility Bed Mobility               General bed mobility comments: OOB upon entry in recliner   Transfers Overall transfer level: Needs assistance Equipment used: Rolling walker (2 wheeled) Transfers: Sit to/from Stand Sit to Stand: Supervision         General transfer comment: for safety and balance, cueing for hand placement     Balance Overall balance assessment: Needs assistance Sitting-balance support: No upper extremity supported;Feet supported Sitting balance-Leahy Scale: Good     Standing balance  support: Bilateral upper extremity supported;No upper extremity supported;During functional activity Standing balance-Leahy Scale: Fair Standing balance comment: relies on BUE support dynamically                            ADL either performed or assessed with clinical judgement   ADL Overall ADL's : Needs assistance/impaired     Grooming: Wash/dry face;Supervision/safety;Standing Grooming Details (indicate cue type and reason): decreased activity tolerance in standing      Lower Body Bathing: Minimal assistance Lower Body Bathing Details (indicate cue type and reason): bathing peri area only, min cueing for sequencing, attention and safety; min guard for balance     Lower Body Dressing: Min guard;Sit to/from stand Lower Body Dressing Details (indicate cue type and reason): min guard for dynamic sitting and standing balalance, donning underwear  Toilet Transfer: Supervision/safety;Ambulation;RW Toilet Transfer Details (indicate cue type and reason): to [facility] in room, requires cueing for safety/RW mgmt          Functional mobility during ADLs: Supervision/safety;Rolling walker;Cueing for safety General ADL Comments: Pt educated on energy conservation techniques, home safety, fall prevention.  She fatigues easily, limited by activity tolerance and generalized weakness, as well as safety/cognition. SPO2 maintained throughout session on RA. HR ranged from 100-120.      Vision       Perception     Praxis      Cognition Arousal/Alertness: Awake/alert Behavior During Therapy: WFL for tasks assessed/performed Overall Cognitive Status: Impaired/Different from baseline Area of Impairment: Attention;Safety/judgement;Awareness;Problem solving  Current Attention Level: Sustained     Safety/Judgement: Decreased awareness of safety;Decreased awareness of deficits Awareness: Emergent Problem Solving: Requires verbal cues;Difficulty  sequencing General Comments: patient following commands with increased time but requires cueing to sequence and attend to tasks; min cueing for safety awareness throughout session          Exercises     Shoulder Instructions       General Comments      Pertinent Vitals/ Pain       Pain Assessment: No/denies pain  Home Living                                          Prior Functioning/Environment              Frequency  Min 2X/week        Progress Toward Goals  OT Goals(current goals can now be found in the care plan section)  Progress towards OT goals: Progressing toward goals  Acute Rehab OT Goals Patient Stated Goal: get stronger, go home  Plan Frequency remains appropriate;Discharge plan needs to be updated    Co-evaluation                 AM-PAC OT "6 Clicks" Daily Activity     Outcome Measure   Help from another person eating meals?: None Help from another person taking care of personal grooming?: A Little Help from another person toileting, which includes using toliet, bedpan, or urinal?: A Little Help from another person bathing (including washing, rinsing, drying)?: A Little Help from another person to put on and taking off regular upper body clothing?: A Little Help from another person to put on and taking off regular lower body clothing?: A Little 6 Click Score: 19    End of Session Equipment Utilized During Treatment: Rolling walker  OT Visit Diagnosis: Unsteadiness on feet (R26.81);Muscle weakness (generalized) (M62.81)   Activity Tolerance Patient tolerated treatment well   Patient Left in chair;with call bell/phone within reach   Nurse Communication          Time: 6270-3500 OT Time Calculation (min): 37 min  Charges: OT General Charges $OT Visit: 1 Visit OT Treatments $Self Care/Home Management : 23-37 mins   Barry Brunner, OT Acute Rehabilitation Services Pager [phone] Office  [phone]    Chancy Milroy 09/07/2019, 10:32 AM

## 2019-09-07 NOTE — Progress Notes (Signed)
Occupational Therapy Treatment Patient Details Name: Breanna Moore MRN: [id number] DOB: 19-Dec-1975 Today's Date: 09/07/2019    History of present illness Breanna Moore is a 44 y.o. female presenting with dyspnea. PMH is significant for HTN, OSA, obesity and major depression.   OT comments  Returned to see patient to provide HEP, as case manager reports pt unable to get [facility] services at dc.  Reviewed program with pt, requires increased time to process and sequence through exercises--handout and level 2 theraband provided.  Pt completing transfers and mobility in room with supervision using RW. Spo2 maintained on RA. Requires cueing for attention to task throughout session. Will follow.    Follow Up Recommendations  Supervision/Assistance - 24 hour;Home health OT(maximal HH services -declined SNF )    Equipment Recommendations  3 in 1 bedside commode;Tub/shower bench    Recommendations for Other Services      Precautions / Restrictions Precautions Precautions: Fall Precaution Comments: Monitor SpO2 Restrictions Weight Bearing Restrictions: No       Mobility Bed Mobility               General bed mobility comments: OOB upon entry in recliner   Transfers Overall transfer level: Needs assistance Equipment used: Rolling walker (2 wheeled) Transfers: Sit to/from Stand Sit to Stand: Supervision         General transfer comment: for safety and balance, good recall of hand placement     Balance Overall balance assessment: Needs assistance Sitting-balance support: No upper extremity supported;Feet supported Sitting balance-Leahy Scale: Good     Standing balance support: Bilateral upper extremity supported;No upper extremity supported;During functional activity Standing balance-Leahy Scale: Fair Standing balance comment: relies on BUE support dynamically                            ADL either performed or assessed with clinical judgement   ADL Overall ADL's  : Needs assistance/impaired     Grooming: Wash/dry face;Supervision/safety;Standing Grooming Details (indicate cue type and reason): decreased activity tolerance in standing      Lower Body Bathing: Minimal assistance Lower Body Bathing Details (indicate cue type and reason): bathing peri area only, min cueing for sequencing, attention and safety; min guard for balance     Lower Body Dressing: Min guard;Sit to/from stand Lower Body Dressing Details (indicate cue type and reason): min guard for dynamic sitting and standing balalance, donning underwear  Toilet Transfer: Supervision/safety;Ambulation;RW Toilet Transfer Details (indicate cue type and reason): to [facility] in room, requires cueing for safety/RW mgmt          Functional mobility during ADLs: Supervision/safety;Rolling walker;Cueing for safety General ADL Comments: session focused on HEP      Vision       Perception     Praxis      Cognition Arousal/Alertness: Awake/alert Behavior During Therapy: WFL for tasks assessed/performed Overall Cognitive Status: Impaired/Different from baseline Area of Impairment: Attention;Safety/judgement;Awareness;Problem solving                   Current Attention Level: Sustained     Safety/Judgement: Decreased awareness of safety;Decreased awareness of deficits Awareness: Emergent Problem Solving: Requires verbal cues;Difficulty sequencing General Comments: patient following commands with increased time but requires cueing to sequence and attend to tasks; min cueing for safety awareness throughout session          Exercises Exercises: Other exercises Other Exercises Other Exercises: Provided HEP and level 2 theraband for home use, completed  5 reps x 1 set of shoulder flexion, shoulder horiziontal abduction, elbow flexion, elbow extension    Shoulder Instructions       General Comments      Pertinent Vitals/ Pain       Pain Assessment: No/denies pain  Home Living                                           Prior Functioning/Environment              Frequency  Min 2X/week        Progress Toward Goals  OT Goals(current goals can now be found in the care plan section)  Progress towards OT goals: Progressing toward goals  Acute Rehab OT Goals Patient Stated Goal: home today   Plan Discharge plan remains appropriate;Frequency remains appropriate    Co-evaluation                 AM-PAC OT "6 Clicks" Daily Activity     Outcome Measure   Help from another person eating meals?: None Help from another person taking care of personal grooming?: A Little Help from another person toileting, which includes using toliet, bedpan, or urinal?: A Little Help from another person bathing (including washing, rinsing, drying)?: A Little Help from another person to put on and taking off regular upper body clothing?: A Little Help from another person to put on and taking off regular lower body clothing?: A Little 6 Click Score: 19    End of Session Equipment Utilized During Treatment: Rolling walker  OT Visit Diagnosis: Unsteadiness on feet (R26.81);Muscle weakness (generalized) (M62.81)   Activity Tolerance Patient tolerated treatment well   Patient Left in chair;with call bell/phone within reach   Nurse Communication          Time: 8527-7824 OT Time Calculation (min): 27 min  Charges: OT General Charges $OT Visit: 1 Visit OT Treatments $Self Care/Home Management : 23-37 mins $Therapeutic Activity: 8-22 mins $Therapeutic Exercise: 8-22 mins  Jolaine Artist, OT [place] Pager [phone] Office [phone]    Delight Stare 09/07/2019, 1:34 PM

## 2019-09-07 NOTE — Progress Notes (Signed)
Physical Therapy Treatment Patient Details Name: Breanna Moore MRN: [id number] DOB: 1975/10/06 Today's Date: 09/07/2019    History of Present Illness Breanna Moore is a 44 y.o. female presenting with dyspnea. PMH is significant for HTN, OSA, obesity and major depression.    PT Comments    Pt to d/c home today or tomorrow depending on ability to get a ride. Pt is making good progress towards her goals, however continues to be limited in safe mobility by decreased strength, balance and endurance. Pt RW arrived during session, assisted pt with height adjustment. Pt is mod I for bed mobility, and min guard for transfers and ambulation of 20 feet. Pt requires seated rest breaks and educated on need to be able to have places to sit in her house while she is progressing her ambulation. PT still recommending SNF/HHPT due to deconditioning however unable to attain given location of home and insurance. Encouraged pt to continue with Outpatient PT.     Follow Up Recommendations  Outpatient PT;Supervision for mobility/OOB     Equipment Recommendations  Rolling walker with 5" wheels;3in1 (PT)       Precautions / Restrictions Precautions Precautions: Fall Precaution Comments: Monitor SpO2 Restrictions Weight Bearing Restrictions: No    Mobility  Bed Mobility Overal bed mobility: Needs Assistance       Supine to sit: Modified independent (Device/Increase time)     General bed mobility comments: increased time and effort   Transfers Overall transfer level: Needs assistance Equipment used: Rolling walker (2 wheeled) Transfers: Sit to/from Stand Sit to Stand: Supervision         General transfer comment: good carry over from OT session, able to power up without pulling on RW  Ambulation/Gait Ambulation/Gait assistance: Min guard Gait Distance (Feet): 20 Feet(2x20) Assistive device: Rolling walker (2 wheeled) Gait Pattern/deviations: Step-through pattern;Decreased step length -  right;Decreased step length - left;Shuffle;Wide base of support Gait velocity: slowed Gait velocity interpretation: <1.8 ft/sec, indicate of risk for recurrent falls General Gait Details: Pt with strong ambulation out into hallway, however after turning around to head back to her room, pt became quiet and leaned over her walker, RN retrieved straight back chair and pt took seated rest break before returning to room. Once in room pt able to independently use bathroom before sitting on EoB         Balance Overall balance assessment: Needs assistance Sitting-balance support: No upper extremity supported;Feet supported Sitting balance-Leahy Scale: Good     Standing balance support: Bilateral upper extremity supported;No upper extremity supported;During functional activity Standing balance-Leahy Scale: Fair Standing balance comment: relies on BUE support dynamically                             Cognition Arousal/Alertness: Awake/alert Behavior During Therapy: WFL for tasks assessed/performed Overall Cognitive Status: Impaired/Different from baseline Area of Impairment: Attention;Safety/judgement;Awareness;Problem solving                   Current Attention Level: Sustained     Safety/Judgement: Decreased awareness of safety;Decreased awareness of deficits Awareness: Emergent Problem Solving: Requires verbal cues;Difficulty sequencing General Comments: pt able to demonstrate carryover from OT session, showing off proper sit>stand with RW         General Comments General comments (skin integrity, edema, etc.): VSS      Pertinent Vitals/Pain Pain Assessment: No/denies pain           PT Goals (current goals can now  be found in the care plan section) Acute Rehab PT Goals Patient Stated Goal: home today  PT Goal Formulation: With patient Time For Goal Achievement: 09/13/19 Potential to Achieve Goals: Fair Progress towards PT goals: Progressing toward  goals    Frequency    Min 3X/week      PT Plan Current plan remains appropriate       AM-PAC PT "6 Clicks" Mobility   Outcome Measure  Help needed turning from your back to your side while in a flat bed without using bedrails?: None Help needed moving from lying on your back to sitting on the side of a flat bed without using bedrails?: None Help needed moving to and from a bed to a chair (including a wheelchair)?: None Help needed standing up from a chair using your arms (e.g., wheelchair or bedside chair)?: None Help needed to walk in hospital room?: None Help needed climbing 3-5 steps with a railing? : A Little 6 Click Score: 23    End of Session   Activity Tolerance: Patient tolerated treatment well Patient left: with call bell/phone within reach;in chair Nurse Communication: Mobility status PT Visit Diagnosis: Repeated falls (R29.6);Other abnormalities of gait and mobility (R26.89);Unsteadiness on feet (R26.81);Muscle weakness (generalized) (M62.81);Difficulty in walking, not elsewhere classified (R26.2)     Time: 6754-4920 PT Time Calculation (min) (ACUTE ONLY): 32 min  Charges:  $Gait Training: 8-22 mins $Therapeutic Activity: 8-22 mins                     Raea Magallon B. Migdalia Dk PT, DPT Acute Rehabilitation Services Pager [phone] Office [phone]    [place] 09/07/2019, 5:16 PM

## 2019-09-07 NOTE — Progress Notes (Signed)
SATURATION QUALIFICATIONS: (This note is used to comply with regulatory documentation for home oxygen)  Patient Saturations on Room Air at Rest = 98 %  Patient Saturations on Room Air while Ambulating =  92  %  Patient Saturations on  2  Liters of oxygen while Ambulating =  95  %  Please briefly explain why patient needs home oxygen:

## 2019-09-07 NOTE — TOC Transition Note (Addendum)
Transition of Care [facility]) - CM/SW Discharge Note   Patient Details  Name: Breanna Moore MRN: [id number] Date of Birth: 1976/03/08  Transition of Care [facility]) CM/SW Contact:  Gala Lewandowsky, RN Phone Number: 09/07/2019, 10:46 AM   Clinical Narrative:  Case Manager spoke with patient this morning regarding home health services- [facility] cannot accept patient's in [place] at this time and [employer] does not service the [facility] area. Patient is aware that home health services are unable to be set up at this time. Case Manager did offer the option of outpatient PT in the area vs. PT/OT providing patient with a printed copy of exercises for her to do in the home. Patient is agreeable to outpatient PT at this time, and if the co pay is too expensive then the alternative is to do the exercises from the printed sheet in the home. Patient is agreeable to outpatient PT- Case Manager discussed this option with Dr. Leary Roca and she will write a Rx for outpatient PT services for evaluation and treatment. Durable medical equipment (DME) 3n1, Shower chair-bariatric and possible oxygen to be delivered (if she qualifies for oxygen) to the room prior to transition home from [facility]. Patient states her mother may not be able to pick her up today due to a meeting. Patient is trying to establish transportation home at this time. Case Manager discussed all orders with physician when we discussed plan of care.   1255 09-07-19 Case Manager called several outpatient physical therapy sites in [place] and close to [facility]. [facility] accepts [facility] and patient will have a co-pay for services. Patient has the address and phone number. Per patient son will transport her to [facility] to her moms home. Staff RN ambulated patient and her saturations did not drop below 88%. Case Manager will continue to follow for additional transition of care needs.    1401  09-07-19 Case Manager spoke with MD- and the patient will stay overnight. Patient will have transportation for Wednesday. [employer] will follow up with the patient to see if she mets for the Trilogy Vent for home. Case Manager will continue to follow for additional transition of care needs.     Final next level of care: Home/Self Care Barriers to Discharge: Barriers Resolved  Patient Goals and CMS Choice Patient states their goals for this hospitalization and ongoing recovery are:: to return home with support of parents. CMS Medicare.gov Compare Post Acute Care list provided to:: Patient Choice offered to / list presented to : Patient   Discharge Plan and Services In-house Referral: NA Discharge Planning Services: CM Consult Post Acute Care Choice: Home Health          DME Arranged: 3-N-1, Shower stool, Oxygen DME Agency: Other - Comment([facility]) Date DME Agency Contacted: 09/07/19 Time DME Agency Contacted: 1045 Representative spoke with at DME Agency: [facility]ins [facility] Arranged: NA(Unable to find an agency 2/2 insurance- pt will need a prescription for outpatient PT Services.)     Readmission Risk Interventions No flowsheet data found.

## 2019-09-07 NOTE — Progress Notes (Signed)
Family Medicine Teaching Service Daily Progress Note Intern Pager: [phone]  Patient name: Breanna Moore Medical record number: [id number] Date of birth: January 08, 1976 Age: 44 y.o. Gender: female  Primary Care Provider: [facility] [place] Consultants: None Code Status: FULL  Pt Overview and Major Events to Date:  5/1 Admitted to FPTS  Assessment and Plan: Breanna Moore is a 44 y.o. female presenting with dyspnea. PMH is significant for HTN, OSA, obesity and major depression.  Hypercarbic respiratory failure likely 2/2 OSA, OHS Stable from respiratory standpoint. Patient has hypoxemic events at night requiring 2L O2 [place]. During the day stable on RA. Continuing to tolerate home CPAP well. PASSR has held up disposition and still not back, as well as SNF patient accepted to could not ultimately take patient. Consequently, patient has chosen to go home with her mother and [facility]. Will ambulate patient with pulse ox to determine home O2 needs.  Patient is medically stable for discharge at this time, will plan for her to discharge today to her mother's house with outpatient PT. -Vital signs per floor routine -Continuous pulse ox -DuoNebs every 6 hours as needed -CPAP at bedtime -OT/PT -Social work to assist with SNF placement  Stress incontinence Patient with with symptoms of stress incontinence which includes some dribbling/leakage of urine while sneezing and coughing, still present today.  Has had 2 children.  Recommended trying Kegel exercises and follow-up with her PCP for this issue.   -Recommend trying Kegel exercises -Follow-up with PCP  Microcytic anemia Hemoglobin 10.1, MCV 70.8.  Ferritin 4, sat ratio 7. S/p ferraheme x1. - ferrous sulfate 325 mg daily - monitor for resolution as an outpatient  Enlarged thyroid Noted to have a enlargement of her thyroid lobes L>R.  Will need annual follow-up with ultrasound and continue to monitor for any symptoms. -Continue to monitor for  any changes -Follow up annually with [place]   Thoracic Aortic Aneurysm Incidentally noted on CTA from 5/1, size is 4.4 cm. Recommend follow up with vascular surgery for annual exams.  - follow up with PCP for annual imaging via CTA or MRA   Major depression   Normal mood, normal affect, denies SI. No previous suicide attempts. Depression is currently treated and well controlled. Home meds: Abilify 5 mg once at bedtime, Prozac 20 mg once at bedtime, Hydroxyzine 10 mg Q3 -Continue home medications   Hypertension Normotensive today: 129/87 Home meds: Lisinopril 10 mg daily, HCTZ 25 mg daily. Reports misses 1-2 doses per week.  -Continue lisinopril and HCTZ  Tingling in feet Patient reports "tingling" and "heaviness" in legs. Foot sensation is normal with neurologic testing. She does have some trace dependent edema today, she has been sleeping in a recliner. Most likely process of deconditioning and some dependent edema. - Recommend continued HHPT - Compression stockings  FEN/GI: Regular diet Prophylaxis: Lovenox   Disposition: Pending SNF placement   Subjective:  Patient feeling overall better today than yesterday as she slept well. Still requiring 2L O2 [place] at night per respiratory therapy. Patient having some tingling/heaviness in her feet. Discussed disposition plan. Because of difficulty with PASSR, patient is opting to go home with her mother as she has improved somewhat in her days here.  Objective: Temp:  [98.5 F (36.9 C)-99.3 F (37.4 C)] 99.3 F (37.4 C) (05/11 0402) Pulse Rate:  [94-97] 94 (05/11 0402) Resp:  [20] 20 (05/11 0402) BP: (122-135)/(68-95) 129/87 (05/11 0402) SpO2:  [96 %-100 %] 98 % (05/11 0402) Weight:  [177.8 kg] 177.8 kg (  05/11 0402)  Physical Exam: General: Very pleasant 44 year old Afro-American woman, no acute distress, sitting in chair Cardiac: Regular rate and rhythm, no M/R/G Respiratory: Lungs clear to auscultation bilaterally, no accessory  muscle use.  On RA. Extremities: Skin warm and dry, trace lower extremity edema Neuro: no focal neurologic deficits appreciated; normal sensation in feet, 2+ DTRs Psych: Very pleasant, normal mood and affect  Laboratory: Recent Labs  Lab 09/03/19 0415 09/04/19 0514 09/06/19 0452  WBC 14.9* 11.3* 9.6  HGB 10.4* 10.9* 10.1*  HCT 41.0 42.2 38.4  PLT 414* 392 424*   Recent Labs  Lab 09/02/19 0328 09/03/19 0415 09/04/19 0514  NA 140 140 137  K 4.3 4.5 4.4  CL 93* 94* 92*  CO2 36* 34* 30  BUN 18 19 21*  CREATININE 0.90 0.84 0.87  CALCIUM 9.4 9.4 9.3  GLUCOSE 85 81 89    Imaging/Diagnostic Tests: ECHO IMPRESSIONS  1. Moderate dilatation of the right ventricle and right atrium with RV  strain suspicious for a pulmonary embolism or other lung pathology.  2. Left ventricular ejection fraction, by estimation, is 65 to 70%. The  left ventricle has normal function. The left ventricle has no regional  wall motion abnormalities. There is severe concentric left ventricular  hypertrophy. Left ventricular diastolic  parameters are consistent with Grade II diastolic dysfunction  (pseudonormalization).  3. Right ventricular systolic function is mildly reduced. The right  ventricular size is moderately enlarged.  4. Left atrial size was mildly dilated.  5. Right atrial size was moderately dilated.  6. The mitral valve is normal in structure. No evidence of mitral valve  regurgitation. No evidence of mitral stenosis.  7. The aortic valve is normal in structure. Aortic valve regurgitation is  not visualized. No aortic stenosis is present.  8. The inferior vena cava is normal in size with greater than 50%  respiratory variability, suggesting right atrial pressure of 3 mmHg.  THYROID ULTRASOUND COMPARISON:  CTA of the chest on 08/28/2018 No abnormal lymph nodes identified. IMPRESSION: 1. 2 cm right superior thyroid nodule (nodule 1) meets criteria for 1 year follow-up  ultrasound. 2. 4.8 cm solid and cystic left inferior thyroid nodule (nodule 2) technically does not meet criteria for follow-up. However, given its size, 1 year follow-up ultrasound is felt to be appropriate at the time of follow-up of the other nodule.    Shirlean Mylar, MD 09/07/2019, 7:33 AM PGY-1, [facility] Family Medicine FPTS Intern pager: [phone], text pages welcome

## 2019-09-07 NOTE — Progress Notes (Signed)
Patient continues to exhibit signs of hypercapnia associated with morbid obesity that is causing thoracic restriction.  Interruption or failure to provide NIV would quickly lead to exacerbation of the patient's condition, repeat hospital admission, and likely harm to the patient. Continued use is preferred.  The use of the NIV will treat patient's high PC02 levels and can reduce risk of exacerbations and future hospitalizations when used at night and during the day.  BiLevel/RAD Therapy with and without a rate would be ineffective as patient requires a volume targeted mode. Ventilation is required to decrease the work of breathing and improve pulmonary status. Interruption of ventilator support would lead to decline of health status.  Patient is able to protect their airways and clear secretions on their own.  Shirlean Mylar, MD [facility] Family Medicine Residency, PGY-1

## 2019-09-08 MED ORDER — LISINOPRIL 10 MG PO TABS: 10.0000 mg | ORAL_TABLET | Freq: Every day | ORAL | 0 refills | Status: DC

## 2019-09-08 MED ORDER — FLUOXETINE HCL 20 MG PO CAPS: 20.0000 mg | ORAL_CAPSULE | Freq: Every day | ORAL | 0 refills | Status: DC

## 2019-09-08 MED ORDER — ALBUTEROL SULFATE HFA 108 (90 BASE) MCG/ACT IN AERS: 1.0000 | INHALATION_SPRAY | Freq: Four times a day (QID) | RESPIRATORY_TRACT | 0 refills | Status: DC | PRN

## 2019-09-08 MED ORDER — ARIPIPRAZOLE 5 MG PO TABS: 5.0000 mg | ORAL_TABLET | Freq: Every day | ORAL | 0 refills | Status: DC

## 2019-09-08 MED ORDER — LORATADINE 10 MG PO TABS
10.0000 mg | ORAL_TABLET | Freq: Every day | ORAL | 0 refills | Status: DC
Start: 2019-09-08 — End: 2020-10-03

## 2019-09-08 MED ORDER — HYDROCHLOROTHIAZIDE 25 MG PO TABS: 25.0000 mg | ORAL_TABLET | Freq: Every day | ORAL | 0 refills | Status: DC

## 2019-09-08 MED ORDER — HYDROXYZINE HCL 10 MG PO TABS: 10.0000 mg | ORAL_TABLET | Freq: Three times a day (TID) | ORAL | 0 refills | Status: DC | PRN

## 2019-09-08 MED ORDER — FERROUS SULFATE 325 (65 FE) MG PO TABS: 325.0000 mg | ORAL_TABLET | Freq: Every day | ORAL | 0 refills | Status: DC

## 2019-09-08 NOTE — Plan of Care (Signed)
  Problem: Health Behavior/Discharge Planning: Goal: Ability to manage health-related needs will improve Outcome: Completed/Met   Problem: Clinical Measurements: Goal: Will remain free from infection Outcome: Completed/Met Goal: Diagnostic test results will improve Outcome: Completed/Met Goal: Respiratory complications will improve Outcome: Completed/Met Goal: Cardiovascular complication will be avoided Outcome: Completed/Met   Problem: Activity: Goal: Risk for activity intolerance will decrease Outcome: Completed/Met   Problem: Elimination: Goal: Will not experience complications related to bowel motility Outcome: Completed/Met   Problem: Safety: Goal: Ability to remain free from injury will improve Outcome: Completed/Met   Problem: Skin Integrity: Goal: Risk for impaired skin integrity will decrease Outcome: Completed/Met

## 2019-09-08 NOTE — Discharge Instructions (Signed)
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 I recommend you use your CPAP every night to prevent CO2 retention. Be sure to go to the titration study to be assessed for home oxygen with your CPAP.  I recommend following up with physical therapy and/or continuing exercise to improve your deconditioning (able to tolerate more walking/exercise).  For weight management, I placed a referral to [facility]. You can also call them directly at [phone], [email or web address]. I did not find any weight management services in [facility], but there are many weight loss clinics in [place] that you may contact.  If you find yourself becoming very tired, your family members report you acting strangely (not making sense, confusion), or you have shortness of breath or trouble breathing, please seek immediate medical care at your nearest emergency department.  Keep up the good work!   Obesity Hypoventilation Syndrome  Obesity hypoventilation syndrome (OHS) means that you are not breathing well enough to get air in and out of your lungs efficiently (ventilation). This causes a low oxygen level and a high carbon dioxide level in your blood (hypoventilation). Having too much total body fat (obesity) is a significant risk factor for developing OHS. OHS makes it harder for your heart to pump oxygen-rich blood to your body. It can cause sleep disturbances and make you feel sleepy during the day. Over time, OHS can increase your risk for:  Heart disease.  High blood pressure (hypertension).  Reduced ability to absorb sugar from the bloodstream (insulin resistance).  Heart failure. Over time, OHS weakens your heart and can lead to heart failure. What are the causes? The exact cause of OHS is not known. Possible causes  include:  Pressure on the lungs from excess body weight.  Obesity-related changes in how much air the lungs can hold (lung capacity) and how much they can expand (lung compliance).  Failure of the brain to regulate oxygen and carbon dioxide levels properly.  Chemicals (hormones) produced by excess fat cells interfering with breathing regulation.  A breathing condition in which breathing pauses or becomes shallow during sleep (sleep apnea). This condition can eventually cause the body to ventilate poorly and to hold onto carbon dioxide during the day. What increases the risk? You may have a greater risk for OHS if you:  Have a BMI of 30 or higher. BMI is an estimate of body fat that is calculated from height and weight. For adults, a BMI of 30 or higher is considered obese.  Are 38?44 years old.  Carry most of your excess weight around your waist.  Experience moderate symptoms of sleep apnea. What are the signs or symptoms? The most common symptoms of OHS are:  Daytime sleepiness.  Lack of energy.  Shortness of breath.  Morning headaches.  Sleep apnea.  Trouble concentrating.  Irritability, mood swings, or depression.  Swollen veins in the neck.  Swelling of the legs. How is this diagnosed? Your health care provider may suspect OHS if you are obese and have poor breathing during the day and at night. Your health care provider will also do a physical exam. You may have tests to:  Measure your BMI.  Measure your blood oxygen level with a sensor placed on your finger (pulse oximetry).  Measure blood oxygen and carbon dioxide in a blood sample.  Measure the amount of red blood cells in a  blood sample. OHS causes the number of red blood cells you have to increase (polycythemia).  Check your breathing ability (pulmonary function testing).  Check your breathing ability, breathing patterns, and oxygen level while you sleep (sleep study). You may also have a chest X-ray  to rule out other breathing problems. You may have an electrocardiogram (ECG) and or echocardiogram to check for signs of heart failure. How is this treated? Weight loss is the most important part of treatment for OHS, and it may be the only treatment that you need. Other treatments may include:  Using a device to open your airway while you sleep, such as a continuous positive airway pressure (CPAP) machine that delivers oxygen to your airway through a mask.  Surgery (gastric bypass surgery) to lower your BMI. This may be needed if: ? You are very obese. ? Other treatments have not worked for you. ? Your OHS is very severe and is causing organ damage, such as heart failure. Follow these instructions at home:  Medicines  Take over-the-counter and prescription medicines only as told by your health care provider.  Ask your health care provider what medicines are safe for you. You may be told to avoid medicines that can impair breathing and make OHS worse, such as sedatives and narcotics. Sleeping habits  If you are prescribed a CPAP machine, make sure you understand and use the machine as directed.  Try to get 8 hours of sleep every night.  Go to bed at the same time every night, and get up at the same time every day. General instructions  Work with your health care provider to make a diet and exercise plan that helps you reach and maintain a healthy weight.  Eat a healthy diet.  Avoid smoking.  Exercise regularly as told by your health care provider.  During the evening, do not drink caffeine and do not eat heavy meals.  Keep all follow-up visits as told by your health care provider. This is important. Contact a health care provider if:  You experience new or worsening shortness of breath.  You have chest pain.  You have an irregular heartbeat (palpitations).  You have dizziness.  You faint.  You develop a cough.  You have a fever.  You have chest pain when you  breathe (pleurisy). This information is not intended to replace advice given to you by your health care provider. Make sure you discuss any questions you have with your health care provider. Document Revised: 08/07/2018 Document Reviewed: 09/25/2015 Elsevier Patient Education  [place].

## 2019-09-08 NOTE — TOC Transition Note (Signed)
Transition of Care [facility]) - CM/SW Discharge Note   Patient Details  Name: Breanna Moore MRN: [id number] Date of Birth: Aug 26, 1975  Transition of Care [facility]) CM/SW Contact:  Gala Lewandowsky, RN Phone Number: 09/08/2019, 11:15 AM   Clinical Narrative:  Plan for patient to transition home to [facility] with support of her parents. Patient will have a Rx for outpatient PT Services. Case Manager received verbal permission to fax face-sheet information to [facility] so the rehab facility can view the insurance prior to visit. Durable Medical Equipment has arrived to the room- patient will not need oxygen bleed in for CPAP at this time. Patient has declined Trilogy due to cost at this time. Patient's mother to transport home via private vehicle.   Final next level of care: Home/Self Care Barriers to Discharge: Barriers Resolved   Patient Goals and CMS Choice Patient states their goals for this hospitalization and ongoing recovery are:: to return home with support of parents. CMS Medicare.gov Compare Post Acute Care list provided to:: Patient Choice offered to / list presented to : Patient  Discharge Plan and Services In-house Referral: NA Discharge Planning Services: CM Consult Post Acute Care Choice: Home Health          DME Arranged: 3-N-1, Shower stool, Oxygen DME Agency: Other - Comment([facility]) Date DME Agency Contacted: 09/07/19 Time DME Agency Contacted: 1045 Representative spoke with at DME Agency: Standley Dakins [facility] Arranged: NA(Unable to find an agency 2/2 insurance- pt will need a prescription for outpatient PT Services.)    Readmission Risk Interventions No flowsheet data found.

## 2019-10-08 ENCOUNTER — Other Ambulatory Visit: Payer: Self-pay | Admitting: Family Medicine

## 2019-10-22 ENCOUNTER — Other Ambulatory Visit: Payer: Self-pay | Admitting: Family Medicine

## 2019-11-07 ENCOUNTER — Other Ambulatory Visit: Payer: Self-pay | Admitting: Family Medicine

## 2020-02-08 ENCOUNTER — Ambulatory Visit (HOSPITAL_COMMUNITY)
Admission: EM | Admit: 2020-02-08 | Discharge: 2020-02-08 | Disposition: A | Discharge status: Home / Self Care | Payer: BLUE CROSS/BLUE SHIELD | Attending: Emergency Medicine | Admitting: Emergency Medicine

## 2020-02-08 ENCOUNTER — Other Ambulatory Visit: Payer: Self-pay

## 2020-02-08 ENCOUNTER — Encounter (HOSPITAL_COMMUNITY): Payer: Self-pay

## 2020-02-08 DIAGNOSIS — Z3202 Encounter for pregnancy test, result negative: Secondary | ICD-10-CM

## 2020-02-08 DIAGNOSIS — Z202 Contact with and (suspected) exposure to infections with a predominantly sexual mode of transmission: Secondary | ICD-10-CM | POA: Diagnosis present

## 2020-02-08 DIAGNOSIS — I10 Essential (primary) hypertension: Secondary | ICD-10-CM | POA: Insufficient documentation

## 2020-02-08 LAB — HIV ANTIBODY (ROUTINE TESTING W REFLEX): HIV Screen 4th Generation wRfx: NONREACTIVE

## 2020-02-08 LAB — POC URINE PREG, ED: Preg Test, Ur: NEGATIVE

## 2020-02-08 MED ORDER — DOXYCYCLINE HYCLATE 100 MG PO CAPS: 100.0000 mg | ORAL_CAPSULE | Freq: Two times a day (BID) | ORAL | 0 refills | Status: AC

## 2020-02-08 NOTE — Discharge Instructions (Signed)
Take the doxycycline twice a day for 7 days.    Your vaginal tests are pending.  If your test results are positive, we will call you.  You and your sexual partner(s) may require treatment at that time.  Do not have sexual activity until the test results are back.  Your blood pressure is elevated today at 148/92.  Please have this rechecked by your primary care provider in 2-4 weeks.

## 2020-02-08 NOTE — ED Triage Notes (Signed)
Pt presents for STD's testing. Reports her sexual partner tested positive for Chlamydia. Pt denies any symptoms.

## 2020-02-08 NOTE — ED Provider Notes (Signed)
[facility]    CSN: [id number] Arrival date & time: 02/08/20  1140      History   Chief Complaint Chief Complaint  Patient presents with  . Exposure to STD    HPI Breanna Moore is a 44 y.o. female.   Patient presents with request for STD testing and treatment.  She states her sexual partner is positive for chlamydia.  She denies fever, chills, rash, lesions, vaginal discharge, abdominal pain, dysuria, back pain, pelvic pain, or other symptoms.  No treatments attempted at home.  Her medical history is significant for morbid obesity, hypertension, goiter, left ventricular hypertrophy, aneurysm of ascending aorta, anemia, acute on chronic respiratory failure, OSA, schizoaffective psychosis.  LMP: 4-5 days ago.   The history is provided by the patient.    Past Medical History:  Diagnosis Date  . BMI 60.0-69.9, adult (HCC)   . Depression   . Hypertension   . Morbid obesity (HCC)   . Non-toxic multinodular goiter 08/29/2019   CTA-Chest 08/28/19: Asymmetric enlargement thyroid lobes, L>R, extending retrosternal with slight mass effect upon the trachea and mild LEFT-to-RIGHT midline shift.    Patient Active Problem List   Diagnosis Date Noted  . Hypoxia   . Thyroid nodule greater than or equal to 1 cm in diameter incidentally noted on imaging study 09/02/2019  . Thyroid nodule greater than or equal to 1.5 cm in diameter incidentally noted on imaging study 09/02/2019  . Thyroid enlargement   . Chest pain, atypical 08/29/2019  . Acute on chronic respiratory failure with hypoxia and hypercapnia (HCC) 08/29/2019  . Microcytic anemia 08/29/2019  . Aneurysm of ascending aorta (HCC) 08/29/2019  . Non-toxic multinodular goiter 08/29/2019  . Atelectasis of both lungs 08/29/2019  . Wheezing 08/29/2019  . Nocturnal oxygen desaturation 08/29/2019  . Obesity hypoventilation syndrome (HCC) 08/29/2019  . LVH (left ventricular hypertrophy) 08/29/2019  . Hypersomnolence 08/29/2019  .  BMI 60.0-69.9, adult (HCC)   . Dyspnea 08/28/2019  . OSA (obstructive sleep apnea) 08/28/2019  . Shortness of breath on exertion 08/11/2017  . Essential hypertension 08/11/2017  . Morbid obesity (HCC) 08/11/2017  . Cigarette smoker 08/11/2017  . Schizo-affective psychosis (HCC) 05/25/2014    Past Surgical History:  Procedure Laterality Date  . CESAREAN SECTION      OB History   No obstetric history on file.      Home Medications    Prior to Admission medications   Medication Sig Start Date End Date Taking? Authorizing Provider  albuterol (VENTOLIN HFA) 108 (90 Base) MCG/ACT inhaler Inhale 1-2 puffs into the lungs every 6 (six) hours as needed for wheezing or shortness of breath. 09/08/19   Shirlean Mylar, MD  ARIPiprazole (ABILIFY) 5 MG tablet Take 1 tablet (5 mg total) by mouth at bedtime. 09/08/19   Shirlean Mylar, MD  doxycycline (VIBRAMYCIN) 100 MG capsule Take 1 capsule (100 mg total) by mouth 2 (two) times daily for 7 days. 02/08/20 02/15/20  Mickie Bail, NP  ferrous sulfate 325 (65 FE) MG tablet Take 1 tablet (325 mg total) by mouth daily with breakfast. 09/09/19   Shirlean Mylar, MD  FLUoxetine (PROZAC) 20 MG capsule Take 1 capsule (20 mg total) by mouth at bedtime. 09/08/19   Shirlean Mylar, MD  fluticasone [facility]) 50 MCG/ACT nasal spray Place 1 spray into both nostrils daily. Patient not taking: Reported on 10/10/2018 02/15/18   Petrucelli, Pleas Koch, PA-C  hydrochlorothiazide (HYDRODIURIL) 25 MG tablet Take 1 tablet (25 mg total) by mouth daily. 09/08/19  Shirlean Mylar, MD  hydrOXYzine (ATARAX/VISTARIL) 10 MG tablet Take 1 tablet (10 mg total) by mouth 3 (three) times daily as needed for anxiety. 09/08/19   Shirlean Mylar, MD  lisinopril (ZESTRIL) 10 MG tablet Take 1 tablet (10 mg total) by mouth daily. 09/08/19   Shirlean Mylar, MD  loratadine (CLARITIN) 10 MG tablet Take 1 tablet (10 mg total) by mouth daily. 09/08/19 09/07/20  Shirlean Mylar, MD     Family History Family History  Problem Relation Age of Onset  . Schizophrenia Mother     Social History Social History   Tobacco Use  . Smoking status: Current Every Day Smoker    Types: Cigarettes  . Smokeless tobacco: Never Used  Vaping Use  . Vaping Use: Never used  Substance Use Topics  . Alcohol use: Yes    Alcohol/week: 0.0 standard drinks    Comment: occasional  . Drug use: No     Allergies   Hydrocodone-acetaminophen, Ketorolac, Paxil [paroxetine hcl], and Penicillins   Review of Systems Review of Systems  Constitutional: Negative for chills and fever.  HENT: Negative for ear pain and sore throat.   Eyes: Negative for pain and visual disturbance.  Respiratory: Negative for cough and shortness of breath.   Cardiovascular: Negative for chest pain and palpitations.  Gastrointestinal: Negative for abdominal pain, diarrhea, nausea and vomiting.  Genitourinary: Negative for dysuria, flank pain, hematuria, pelvic pain and vaginal discharge.  Musculoskeletal: Negative for arthralgias and back pain.  Skin: Negative for color change and rash.  Neurological: Negative for seizures and syncope.  All other systems reviewed and are negative.    Physical Exam Triage Vital Signs ED Triage Vitals  Enc Vitals Group     BP 02/08/20 1417 (!) 148/92     Pulse Rate 02/08/20 1417 89     Resp 02/08/20 1417 (!) 21     Temp 02/08/20 1417 98.5 F (36.9 C)     Temp Source 02/08/20 1417 Oral     SpO2 02/08/20 1417 99 %     Weight --      Height --      Head Circumference --      Peak Flow --      Pain Score 02/08/20 1416 0     Pain Loc --      Pain Edu? --      Excl. in GC? --    No data found.  Updated Vital Signs BP (!) 148/92 (BP Location: Right Wrist)   Pulse 89   Temp 98.5 F (36.9 C) (Oral)   Resp (!) 21   LMP  (Within Days)   SpO2 99%   Visual Acuity Right Eye Distance:   Left Eye Distance:   Bilateral Distance:    Right Eye Near:   Left Eye  Near:    Bilateral Near:     Physical Exam Vitals and nursing note reviewed.  Constitutional:      General: She is not in acute distress.    Appearance: She is well-developed. She is obese. She is not ill-appearing.  HENT:     Head: Normocephalic and atraumatic.  Eyes:     Conjunctiva/sclera: Conjunctivae normal.  Cardiovascular:     Rate and Rhythm: Normal rate and regular rhythm.     Heart sounds: No murmur heard.   Pulmonary:     Effort: Pulmonary effort is normal. No respiratory distress.     Breath sounds: Normal breath sounds.  Abdominal:     Palpations: Abdomen  is soft.     Tenderness: There is no abdominal tenderness. There is no right CVA tenderness, left CVA tenderness, guarding or rebound.  Musculoskeletal:     Cervical back: Neck supple.  Skin:    General: Skin is warm and dry.  Neurological:     General: No focal deficit present.     Mental Status: She is alert and oriented to person, place, and time.     Gait: Gait normal.  Psychiatric:        Mood and Affect: Mood normal.        Behavior: Behavior normal.      UC Treatments / Results  Labs (all labs ordered are listed, but only abnormal results are displayed) Labs Reviewed  RPR  HIV ANTIBODY (ROUTINE TESTING W REFLEX)  POC URINE PREG, ED  CERVICOVAGINAL ANCILLARY ONLY    EKG   Radiology No results found.  Procedures Procedures (including critical care time)  Medications Ordered in UC Medications - No data to display  Initial Impression / Assessment and Plan / UC Course  I have reviewed the triage vital signs and the nursing notes.  Pertinent labs & imaging results that were available during my care of the patient were reviewed by me and considered in my medical decision making (see chart for details).   Exposure to STD (chlamydia).  Elevated blood pressure with known hypertension.  Vaginal self swab obtained by patient for testing.  HIV and RPR pending also.  Patient declines treatment  today other than for chlamydia.  Treating with doxycycline.  Instructed patient to abstain from sexual activity until the test results are back.  Discussed that she and her sexual partners may require treatment at that time.  Discussed with patient that her blood pressure is elevated today and needs to be rechecked by her PCP in 2 to 4 weeks.  Patient agrees to plan of care.     Final Clinical Impressions(s) / UC Diagnoses   Final diagnoses:  Exposure to STD  Elevated blood pressure reading in office with diagnosis of hypertension     Discharge Instructions     Take the doxycycline twice a day for 7 days.    Your vaginal tests are pending.  If your test results are positive, we will call you.  You and your sexual partner(s) may require treatment at that time.  Do not have sexual activity until the test results are back.  Your blood pressure is elevated today at 148/92.  Please have this rechecked by your primary care provider in 2-4 weeks.         ED Prescriptions    Medication Sig Dispense Auth. Provider   doxycycline (VIBRAMYCIN) 100 MG capsule Take 1 capsule (100 mg total) by mouth 2 (two) times daily for 7 days. 14 capsule Mickie Bail, NP     PDMP not reviewed this encounter.   Mickie Bail, NP 02/08/20 [phone]

## 2020-02-09 ENCOUNTER — Telehealth (HOSPITAL_COMMUNITY): Payer: Self-pay | Admitting: Emergency Medicine

## 2020-02-09 LAB — CERVICOVAGINAL ANCILLARY ONLY
Bacterial Vaginitis (gardnerella): POSITIVE — AB
Candida Glabrata: NEGATIVE
Candida Vaginitis: NEGATIVE
Chlamydia: NEGATIVE
Comment: NEGATIVE
Comment: NEGATIVE
Comment: NEGATIVE
Comment: NEGATIVE
Comment: NEGATIVE
Comment: NORMAL
Neisseria Gonorrhea: NEGATIVE
Trichomonas: NEGATIVE

## 2020-02-09 LAB — RPR: RPR Ser Ql: NONREACTIVE

## 2020-02-09 MED ORDER — METRONIDAZOLE 500 MG PO TABS: 500.0000 mg | ORAL_TABLET | Freq: Two times a day (BID) | ORAL | 0 refills | Status: DC

## 2020-10-03 ENCOUNTER — Encounter (HOSPITAL_COMMUNITY): Payer: Self-pay | Admitting: Emergency Medicine

## 2020-10-03 ENCOUNTER — Other Ambulatory Visit: Payer: Self-pay

## 2020-10-03 ENCOUNTER — Emergency Department (HOSPITAL_COMMUNITY): Payer: BLUE CROSS/BLUE SHIELD

## 2020-10-03 ENCOUNTER — Emergency Department (HOSPITAL_COMMUNITY)
Admission: EM | Admit: 2020-10-03 | Discharge: 2020-10-04 | Disposition: A | Discharge status: Other Acute Inpatient Hospital | Payer: BLUE CROSS/BLUE SHIELD | Attending: Emergency Medicine | Admitting: Emergency Medicine

## 2020-10-03 DIAGNOSIS — F250 Schizoaffective disorder, bipolar type: Secondary | ICD-10-CM | POA: Diagnosis not present

## 2020-10-03 DIAGNOSIS — Z79899 Other long term (current) drug therapy: Secondary | ICD-10-CM | POA: Diagnosis not present

## 2020-10-03 DIAGNOSIS — R4182 Altered mental status, unspecified: Secondary | ICD-10-CM | POA: Diagnosis not present

## 2020-10-03 DIAGNOSIS — F17210 Nicotine dependence, cigarettes, uncomplicated: Secondary | ICD-10-CM | POA: Diagnosis not present

## 2020-10-03 DIAGNOSIS — Z1339 Encounter for screening examination for other mental health and behavioral disorders: Secondary | ICD-10-CM | POA: Diagnosis not present

## 2020-10-03 DIAGNOSIS — I10 Essential (primary) hypertension: Secondary | ICD-10-CM | POA: Diagnosis not present

## 2020-10-03 DIAGNOSIS — Z008 Encounter for other general examination: Secondary | ICD-10-CM

## 2020-10-03 DIAGNOSIS — Z20822 Contact with and (suspected) exposure to covid-19: Secondary | ICD-10-CM | POA: Insufficient documentation

## 2020-10-03 LAB — COMPREHENSIVE METABOLIC PANEL
ALT: 15 U/L (ref 0–44)
AST: 10 U/L — ABNORMAL LOW (ref 15–41)
Albumin: 3.4 g/dL — ABNORMAL LOW (ref 3.5–5.0)
Alkaline Phosphatase: 70 U/L (ref 38–126)
Anion gap: 8 (ref 5–15)
BUN: 12 mg/dL (ref 6–20)
CO2: 26 mmol/L (ref 22–32)
Calcium: 9.2 mg/dL (ref 8.9–10.3)
Chloride: 103 mmol/L (ref 98–111)
Creatinine, Ser: 0.87 mg/dL (ref 0.44–1.00)
GFR, Estimated: >60 mL/min (ref >60)
Glucose, Bld: 112 mg/dL — ABNORMAL HIGH (ref 70–99)
Potassium: 3.4 mmol/L — ABNORMAL LOW (ref 3.5–5.1)
Sodium: 137 mmol/L (ref 135–145)
Total Bilirubin: 0.6 mg/dL (ref 0.3–1.2)
Total Protein: 8.3 g/dL — ABNORMAL HIGH (ref 6.5–8.1)

## 2020-10-03 LAB — ETHANOL: Alcohol, Ethyl (B): <10 mg/dL (ref <10)

## 2020-10-03 LAB — SALICYLATE LEVEL: Salicylate Lvl: <7 mg/dL — ABNORMAL LOW (ref 7.0–30.0)

## 2020-10-03 LAB — ACETAMINOPHEN LEVEL: Acetaminophen (Tylenol), Serum: <10 ug/mL — ABNORMAL LOW (ref 10–30)

## 2020-10-03 LAB — CBC
HCT: 39.6 % (ref 36.0–46.0)
Hemoglobin: 11.3 g/dL — ABNORMAL LOW (ref 12.0–15.0)
MCH: 20.9 pg — ABNORMAL LOW (ref 26.0–34.0)
MCHC: 28.5 g/dL — ABNORMAL LOW (ref 30.0–36.0)
MCV: 73.3 fL — ABNORMAL LOW (ref 80.0–100.0)
Platelets: 421 10*3/uL — ABNORMAL HIGH (ref 150–400)
RBC: 5.4 MIL/uL — ABNORMAL HIGH (ref 3.87–5.11)
RDW: 22.4 % — ABNORMAL HIGH (ref 11.5–15.5)
WBC: 11 10*3/uL — ABNORMAL HIGH (ref 4.0–10.5)
nRBC: 0 % (ref 0.0–0.2)

## 2020-10-03 LAB — RESP PANEL BY RT-PCR (FLU A&B, COVID) ARPGX2
Influenza A by PCR: NEGATIVE
Influenza B by PCR: NEGATIVE
SARS Coronavirus 2 by RT PCR: NEGATIVE

## 2020-10-03 LAB — RAPID URINE DRUG SCREEN, HOSP PERFORMED
Amphetamines: NOT DETECTED
Barbiturates: NOT DETECTED
Benzodiazepines: NOT DETECTED
Cocaine: NOT DETECTED
Opiates: NOT DETECTED
Tetrahydrocannabinol: NOT DETECTED

## 2020-10-03 LAB — I-STAT BETA HCG BLOOD, ED (MC, WL, AP ONLY): I-stat hCG, quantitative: <5 m[IU]/mL (ref <5)

## 2020-10-03 MED ORDER — FERROUS SULFATE 325 (65 FE) MG PO TABS
325.0000 mg | ORAL_TABLET | Freq: Every day | ORAL | Status: DC
  Administered 2020-10-03 – 2020-10-04 (×2): 325 mg via ORAL
  Filled 2020-10-03 (×2): qty 1

## 2020-10-03 MED ORDER — ALBUTEROL SULFATE HFA 108 (90 BASE) MCG/ACT IN AERS: 1.0000 | INHALATION_SPRAY | Freq: Four times a day (QID) | RESPIRATORY_TRACT | Status: DC | PRN

## 2020-10-03 MED ORDER — HYDROCHLOROTHIAZIDE 25 MG PO TABS
25.0000 mg | ORAL_TABLET | Freq: Every day | ORAL | Status: DC
  Administered 2020-10-03 – 2020-10-04 (×2): 25 mg via ORAL
  Filled 2020-10-03 (×2): qty 1

## 2020-10-03 MED ORDER — FLUOXETINE HCL 20 MG PO CAPS
20.0000 mg | ORAL_CAPSULE | Freq: Every day | ORAL | Status: DC
  Filled 2020-10-03: qty 1

## 2020-10-03 MED ORDER — LORATADINE 10 MG PO TABS
10.0000 mg | ORAL_TABLET | Freq: Every day | ORAL | Status: DC
  Administered 2020-10-03 – 2020-10-04 (×2): 10 mg via ORAL
  Filled 2020-10-03 (×2): qty 1

## 2020-10-03 MED ORDER — HYDROXYZINE HCL 10 MG PO TABS
10.0000 mg | ORAL_TABLET | Freq: Three times a day (TID) | ORAL | Status: DC | PRN
  Administered 2020-10-04: 10 mg via ORAL
  Filled 2020-10-03: qty 1

## 2020-10-03 MED ORDER — LISINOPRIL 10 MG PO TABS
10.0000 mg | ORAL_TABLET | Freq: Every day | ORAL | Status: DC
  Administered 2020-10-03 – 2020-10-04 (×2): 10 mg via ORAL
  Filled 2020-10-03 (×2): qty 1

## 2020-10-03 MED ORDER — ARIPIPRAZOLE 5 MG PO TABS
5.0000 mg | ORAL_TABLET | Freq: Every day | ORAL | Status: DC
  Filled 2020-10-03: qty 1

## 2020-10-03 NOTE — ED Notes (Signed)
Pt ambulated to x-ray.

## 2020-10-03 NOTE — ED Triage Notes (Signed)
Patient has not been taking her psych medications. Patient family states patient has not been sleeping, or eating, and has been very anxious. No si/hi. Patient need medical clearance.

## 2020-10-03 NOTE — ED Notes (Addendum)
[facility] [employer] contacted regarding transport greater then 75 miles. Patient is being transferred to [facility] in morning. [facility] stated we need to fax over the (75 mile + transportation form). Also there is no weight limit. Breanna Moore notified and is filling form out.

## 2020-10-03 NOTE — ED Provider Notes (Signed)
[facility] Provider Note  CSN: [id number] Arrival date & time: 10/03/20 0156  Chief Complaint(s) Medical Clearance  HPI Breanna Moore is a 45 y.o. female with a history of bipolar brought in by sister for medication noncompliance and altered mental status.  Patient has been off of her psychiatric medication for several months.  No specific timeframe is unknown.  She denies S/HI or AVH.  Family is noted that the patient is more withdrawn.  She stares off into space and does not answer questions at times.  This is similar to previous presentation requiring admission to behavioral health.  Patient has no physical complaints.  Remainder of history, ROS, and physical exam limited due to patient's condition (psychiatric disorder). Additional information was obtained from family.   Level V Caveat.    HPI  Past Medical History Past Medical History:  Diagnosis Date  . BMI 60.0-69.9, adult (HCC)   . Depression   . Hypertension   . Morbid obesity (HCC)   . Non-toxic multinodular goiter 08/29/2019   CTA-Chest 08/28/19: Asymmetric enlargement thyroid lobes, L>R, extending retrosternal with slight mass effect upon the trachea and mild LEFT-to-RIGHT midline shift.   Patient Active Problem List   Diagnosis Date Noted  . Hypoxia   . Thyroid nodule greater than or equal to 1 cm in diameter incidentally noted on imaging study 09/02/2019  . Thyroid nodule greater than or equal to 1.5 cm in diameter incidentally noted on imaging study 09/02/2019  . Thyroid enlargement   . Chest pain, atypical 08/29/2019  . Acute on chronic respiratory failure with hypoxia and hypercapnia (HCC) 08/29/2019  . Microcytic anemia 08/29/2019  . Aneurysm of ascending aorta (HCC) 08/29/2019  . Non-toxic multinodular goiter 08/29/2019  . Atelectasis of both lungs 08/29/2019  . Wheezing 08/29/2019  . Nocturnal oxygen desaturation 08/29/2019  . Obesity hypoventilation syndrome (HCC)  08/29/2019  . LVH (left ventricular hypertrophy) 08/29/2019  . Hypersomnolence 08/29/2019  . BMI 60.0-69.9, adult (HCC)   . Dyspnea 08/28/2019  . OSA (obstructive sleep apnea) 08/28/2019  . Shortness of breath on exertion 08/11/2017  . Essential hypertension 08/11/2017  . Morbid obesity (HCC) 08/11/2017  . Cigarette smoker 08/11/2017  . Schizo-affective psychosis (HCC) 05/25/2014   Home Medication(s) Prior to Admission medications   Medication Sig Start Date End Date Taking? Authorizing Provider  albuterol (VENTOLIN HFA) 108 (90 Base) MCG/ACT inhaler Inhale 1-2 puffs into the lungs every 6 (six) hours as needed for wheezing or shortness of breath. 09/08/19   Shirlean Mylar, MD  ARIPiprazole (ABILIFY) 5 MG tablet Take 1 tablet (5 mg total) by mouth at bedtime. 09/08/19   Shirlean Mylar, MD  ferrous sulfate 325 (65 FE) MG tablet Take 1 tablet (325 mg total) by mouth daily with breakfast. 09/09/19   Shirlean Mylar, MD  FLUoxetine (PROZAC) 20 MG capsule Take 1 capsule (20 mg total) by mouth at bedtime. 09/08/19   Shirlean Mylar, MD  fluticasone [facility]) 50 MCG/ACT nasal spray Place 1 spray into both nostrils daily. Patient not taking: Reported on 10/10/2018 02/15/18   Petrucelli, Pleas Koch, PA-C  hydrochlorothiazide (HYDRODIURIL) 25 MG tablet Take 1 tablet (25 mg total) by mouth daily. 09/08/19   Shirlean Mylar, MD  hydrOXYzine (ATARAX/VISTARIL) 10 MG tablet Take 1 tablet (10 mg total) by mouth 3 (three) times daily as needed for anxiety. 09/08/19   Shirlean Mylar, MD  lisinopril (ZESTRIL) 10 MG tablet Take 1 tablet (10 mg total) by mouth daily. 09/08/19   Shirlean Mylar, MD  loratadine (CLARITIN) 10 MG tablet Take 1 tablet (10 mg total) by mouth daily. 09/08/19 09/07/20  Shirlean Mylar, MD  metroNIDAZOLE (FLAGYL) 500 MG tablet Take 1 tablet (500 mg total) by mouth 2 (two) times daily. 02/09/20   Merrilee Jansky, MD                                                                                                                                     Past Surgical History Past Surgical History:  Procedure Laterality Date  . CESAREAN SECTION     Family History Family History  Problem Relation Age of Onset  . Schizophrenia Mother     Social History Social History   Tobacco Use  . Smoking status: Current Every Day Smoker    Types: Cigarettes  . Smokeless tobacco: Never Used  Vaping Use  . Vaping Use: Never used  Substance Use Topics  . Alcohol use: Yes    Alcohol/week: 0.0 standard drinks    Comment: occasional  . Drug use: No   Allergies Hydrocodone-acetaminophen, Ketorolac, Paxil [paroxetine hcl], and Penicillins  Review of Systems Review of Systems All other systems are reviewed and are negative for acute change except as noted in the HPI  Physical Exam Vital Signs  I have reviewed the triage vital signs BP (!) 152/108 (BP Location: Left Arm)   Pulse 92   Temp 99.1 F (37.3 C) (Oral)   Resp 17   Ht 5\' 5"  (1.651 m)   Wt (!) 174.2 kg   LMP  (LMP Unknown)   SpO2 95%   BMI 63.90 kg/m   Physical Exam Vitals reviewed.  Constitutional:      General: She is not in acute distress.    Appearance: She is well-developed. She is obese. She is not diaphoretic.  HENT:     Head: Normocephalic and atraumatic.     Nose: Nose normal.  Eyes:     General: No scleral icterus.       Right eye: No discharge.        Left eye: No discharge.     Conjunctiva/sclera: Conjunctivae normal.     Pupils: Pupils are equal, round, and reactive to light.  Cardiovascular:     Rate and Rhythm: Normal rate and regular rhythm.     Heart sounds: No murmur heard. No friction rub. No gallop.   Pulmonary:     Effort: Pulmonary effort is normal. No respiratory distress.     Breath sounds: Normal breath sounds. No stridor. No rales.  Abdominal:     General: There is no distension.     Palpations: Abdomen is soft.     Tenderness: There is no abdominal tenderness.   Musculoskeletal:        General: No tenderness.     Cervical back: Normal range of motion and neck supple.  Skin:    General: Skin is warm and dry.  Findings: No erythema or rash.  Neurological:     Mental Status: She is alert and oriented to person, place, and time.  Psychiatric:        Attention and Perception: She is inattentive.        Mood and Affect: Affect is flat.        Speech: Speech is delayed.        Behavior: Behavior is slowed and withdrawn.     ED Results and Treatments Labs (all labs ordered are listed, but only abnormal results are displayed) Labs Reviewed  COMPREHENSIVE METABOLIC PANEL - Abnormal; Notable for the following components:      Result Value   Potassium 3.4 (*)    Glucose, Bld 112 (*)    Total Protein 8.3 (*)    Albumin 3.4 (*)    AST 10 (*)    All other components within normal limits  SALICYLATE LEVEL - Abnormal; Notable for the following components:   Salicylate Lvl <7.0 (*)    All other components within normal limits  ACETAMINOPHEN LEVEL - Abnormal; Notable for the following components:   Acetaminophen (Tylenol), Serum <10 (*)    All other components within normal limits  CBC - Abnormal; Notable for the following components:   WBC 11.0 (*)    RBC 5.40 (*)    Hemoglobin 11.3 (*)    MCV 73.3 (*)    MCH 20.9 (*)    MCHC 28.5 (*)    RDW 22.4 (*)    Platelets 421 (*)    All other components within normal limits  RESP PANEL BY RT-PCR (FLU A&B, COVID) ARPGX2  ETHANOL  RAPID URINE DRUG SCREEN, HOSP PERFORMED  I-STAT BETA HCG BLOOD, ED ([facility], [facility], [facility] ONLY)                                                                                                                         EKG  EKG Interpretation  Date/Time:  Tuesday October 03 2020 03:34:32 EDT Ventricular Rate:  92 PR Interval:  202 QRS Duration: 96 QT Interval:  359 QTC Calculation: 445 R Axis:   43 Text Interpretation: Sinus rhythm Borderline prolonged PR interval Low voltage,  precordial leads 12 Lead; Mason-Likar No acute changes Confirmed by Drema Pryardama, Keiyon Plack [phone]([id number]) on 10/03/2020 4:43:16 AM      Radiology DG Chest 2 View  Result Date: 10/03/2020 CLINICAL DATA:  Shortness of breath.  Appears anxious. EXAM: CHEST - 2 VIEW COMPARISON:  CT angiography chest 08/28/2019, chest x-ray 10/19/2019 FINDINGS: The heart size and mediastinal contours are unchanged. Right base streaky airspace opacity consistent with atelectasis. Right middle lobe hazy airspace opacity. No pulmonary edema. No pleural effusion. No pneumothorax. No acute osseous abnormality. IMPRESSION: Right base streaky airspace opacity as well as right middle lobe hazy airspace opacity may represent atelectasis versus infection/inflammation. Electronically Signed   By: Tish FredericksonMorgane  Naveau M.D.   On: 10/03/2020 03:53    Pertinent labs & imaging results that were available  during my care of the patient were reviewed by me and considered in my medical decision making (see chart for details).  Medications Ordered in ED Medications  ARIPiprazole (ABILIFY) tablet 5 mg (has no administration in time range)  ferrous sulfate tablet 325 mg (has no administration in time range)  FLUoxetine (PROZAC) capsule 20 mg (has no administration in time range)  hydrochlorothiazide (HYDRODIURIL) tablet 25 mg (has no administration in time range)  hydrOXYzine (ATARAX/VISTARIL) tablet 10 mg (has no administration in time range)  lisinopril (ZESTRIL) tablet 10 mg (has no administration in time range)  loratadine (CLARITIN) tablet 10 mg (has no administration in time range)  albuterol (VENTOLIN HFA) 108 (90 Base) MCG/ACT inhaler 1-2 puff (has no administration in time range)                                                                                                                                    Procedures Procedures  (including critical care time)  Medical Decision Making / ED Course I have reviewed the nursing notes for this  encounter and the patient's prior records (if available in EHR or on provided paperwork).   Breanna Moore was evaluated in Emergency Department on 10/03/2020 for the symptoms described in the history of present illness. She was evaluated in the context of the global COVID-19 pandemic, which necessitated consideration that the patient might be at risk for infection with the SARS-CoV-2 virus that causes COVID-19. Institutional protocols and algorithms that pertain to the evaluation of patients at risk for COVID-19 are in a state of rapid change based on information released by regulatory bodies including the CDC and federal and state organizations. These policies and algorithms were followed during the patient's care in the ED.  Withdrawn affect Depression vs hypomania. Medically cleared. [facility] eval and dispo. She is voluntary at this time. Family will stay until dispo has been determined.  Home meds ordered.      Final Clinical Impression(s) / ED Diagnoses Final diagnoses:  Medical clearance for psychiatric admission      This chart was dictated using voice recognition software.  Despite best efforts to proofread,  errors can occur which can change the documentation meaning.   [name], MD 10/03/20 [phone]

## 2020-10-03 NOTE — ED Notes (Signed)
Able to pull self out of the bed without assist but some difficulty secondary to weight. Walked in [address] way to the bathroom. Asked pt to change into purple scrubs. Pt cooperative and pleasant.

## 2020-10-03 NOTE — ED Notes (Signed)
Pt states that she uses a C-PAP at home.

## 2020-10-03 NOTE — Consult Note (Signed)
[facility] ED Progress Note  10/03/2020 12:37 PM Breanna Moore  MRN:  [id number]   Principal Problem: Schizoaffective disorder, bipolar type [facility]) Diagnosis:  Principal Problem:   Schizoaffective disorder, bipolar type (HCC)  Total Time spent with patient: 15 minutes   Subjective: presented to ED accompanied by her sister due to stopping her medications several weeks ago and not behaving at her baseline.  HPI: per assessment note 6/7 by counselor: " Pt is a 45 year old single female who present to [name] ED accompanied by her sister, Breanna Moore [phone], who participated in assessment at Pt's request. Pt has a diagnosis of schizoaffective disorder, bipolar type and has been off medications for several weeks. Pt's family members recognized that Pt is not behaving at her baseline and encouraged her to come to ED for treatment. Pt reports she feels "dazed and confused." Pt appears unfocused and often delays when responding to questions. Pt's sister says Pt has been staring off into space and does not always respond when spoken to. When asked how she would describe her mood, she says "I don't know." Pt acknowledges symptoms including crying spells, social withdrawal, loss of interest in usual pleasures, fatigue, irritability, decreased concentration, decreased sleep, decreased appetite and feelings of worthlessness and hopelessness."    During evaluation Breanna Moore is lying on ED stretcher in no acute distress.  She is alert, oriented x 3, calm and cooperative. Her mood is depressed with congruent affect.She  does not appear to be responding to internal/external stimuli or delusional thoughts.  Patient denies suicidal/self-harm/homicidal ideation, psychosis, and paranoia. She reports that she feels confused and "my thoughts are cloudy". Reports that she stopped taking her medication because "I was fine" Denies substance and alcohol use/abuse.  Patient answered question  appropriately.  Past Psychiatric History: schizoaffective disorder, bipolar type  Past Medical History:  Past Medical History:  Diagnosis Date  . BMI 60.0-69.9, adult (HCC)   . Depression   . Hypertension   . Morbid obesity (HCC)   . Non-toxic multinodular goiter 08/29/2019   CTA-Chest 08/28/19: Asymmetric enlargement thyroid lobes, L>R, extending retrosternal with slight mass effect upon the trachea and mild LEFT-to-RIGHT midline shift.    Past Surgical History:  Procedure Laterality Date  . CESAREAN SECTION     Family History:  Family History  Problem Relation Age of Onset  . Schizophrenia Mother    Family Psychiatric  History:  schizophrenia in mother Social History:  Social History   Substance and Sexual Activity  Alcohol Use Yes  . Alcohol/week: 0.0 standard drinks   Comment: occasional     Social History   Substance and Sexual Activity  Drug Use No    Social History   Socioeconomic History  . Marital status: Single    Spouse name: Not on file  . Number of children: Not on file  . Years of education: Not on file  . Highest education level: Not on file  Occupational History  . Not on file  Tobacco Use  . Smoking status: Current Every Day Smoker    Types: Cigarettes  . Smokeless tobacco: Never Used  Vaping Use  . Vaping Use: Never used  Substance and Sexual Activity  . Alcohol use: Yes    Alcohol/week: 0.0 standard drinks    Comment: occasional  . Drug use: No  . Sexual activity: Never    Birth control/protection: None  Other Topics Concern  . Not on file  Social History Narrative  . Not on  file   Social Determinants of Health   Financial Resource Strain: Not on file  Food Insecurity: Not on file  Transportation Needs: Not on file  Physical Activity: Not on file  Stress: Not on file  Social Connections: Not on file    Sleep: Fair  Appetite:  Good  Current Medications: Current Facility-Administered Medications  Medication Dose Route  Frequency Provider Last Rate Last Admin  . albuterol (VENTOLIN HFA) 108 (90 Base) MCG/ACT inhaler 1-2 puff  1-2 puff Inhalation Q6H PRN Cardama, Amadeo Garnet, MD      . ARIPiprazole (ABILIFY) tablet 5 mg  5 mg Oral QHS Cardama, Amadeo Garnet, MD      . ferrous sulfate tablet 325 mg  325 mg Oral Q breakfast Cardama, Amadeo Garnet, MD   325 mg at 10/03/20 1041  . FLUoxetine (PROZAC) capsule 20 mg  20 mg Oral QHS Cardama, Amadeo Garnet, MD      . hydrochlorothiazide (HYDRODIURIL) tablet 25 mg  25 mg Oral Daily Cardama, Amadeo Garnet, MD   25 mg at 10/03/20 1042  . hydrOXYzine (ATARAX/VISTARIL) tablet 10 mg  10 mg Oral TID PRN Cardama, Amadeo Garnet, MD      . lisinopril (ZESTRIL) tablet 10 mg  10 mg Oral Daily Cardama, Amadeo Garnet, MD   10 mg at 10/03/20 1044  . loratadine (CLARITIN) tablet 10 mg  10 mg Oral Daily Cardama, Amadeo Garnet, MD   10 mg at 10/03/20 1044   Current Outpatient Medications  Medication Sig Dispense Refill  . hydrOXYzine (ATARAX/VISTARIL) 10 MG tablet Take 1 tablet (10 mg total) by mouth 3 (three) times daily as needed for anxiety. 30 tablet 0  . metroNIDAZOLE (FLAGYL) 500 MG tablet Take 1 tablet (500 mg total) by mouth 2 (two) times daily. (Patient not taking: Reported on 10/03/2020) 14 tablet 0    Lab Results:  Results for orders placed or performed during the hospital encounter of 10/03/20 (from the past 48 hour(s))  Comprehensive metabolic panel     Status: Abnormal   Collection Time: 10/03/20  3:47 AM  Result Value Ref Range   Sodium 137 135 - 145 mmol/L   Potassium 3.4 (L) 3.5 - 5.1 mmol/L   Chloride 103 98 - 111 mmol/L   CO2 26 22 - 32 mmol/L   Glucose, Bld 112 (H) 70 - 99 mg/dL    Comment: Glucose reference range applies only to samples taken after fasting for at least 8 hours.   BUN 12 6 - 20 mg/dL   Creatinine, Ser 3.15 0.44 - 1.00 mg/dL   Calcium 9.2 8.9 - 17.6 mg/dL   Total Protein 8.3 (H) 6.5 - 8.1 g/dL   Albumin 3.4 (L) 3.5 - 5.0 g/dL   AST 10 (L) 15  - 41 U/L   ALT 15 0 - 44 U/L   Alkaline Phosphatase 70 38 - 126 U/L   Total Bilirubin 0.6 0.3 - 1.2 mg/dL   GFR, Estimated >16 >07 mL/min    Comment: (NOTE) Calculated using the CKD-EPI Creatinine Equation (2021)    Anion gap 8 5 - 15    Comment: Performed at [facility], [address] [place], [place] [address]  Ethanol     Status: None   Collection Time: 10/03/20  3:47 AM  Result Value Ref Range   Alcohol, Ethyl (B) <10 <10 mg/dL    Comment: (NOTE) Lowest detectable limit for serum alcohol is 10 mg/dL.  For medical purposes only. Performed at [facility], [address] [place], [place] [address]   Salicylate level     Status: Abnormal   Collection Time: 10/03/20  3:47 AM  Result Value Ref Range   Salicylate Lvl <7.0 (L) 7.0 - 30.0 mg/dL    Comment: Performed at [facility], [address] [place], [place] [address]  Acetaminophen level     Status: Abnormal   Collection Time: 10/03/20  3:47 AM  Result Value Ref Range   Acetaminophen (Tylenol), Serum <10 (L) 10 - 30 ug/mL    Comment: (NOTE) Therapeutic concentrations vary significantly. A range of 10-30 ug/mL  may be an effective concentration for many patients. However, some  are best treated at concentrations outside of this range. Acetaminophen concentrations >150 ug/mL at 4 hours after ingestion  and >50 ug/mL at 12 hours after ingestion are often associated with  toxic reactions.  Performed at [facility], [address] [place], [place] [address]   cbc     Status: Abnormal   Collection Time: 10/03/20  3:47 AM  Result Value Ref Range   WBC 11.0 (H) 4.0 - 10.5 K/uL   RBC 5.40 (H) 3.87 - 5.11 MIL/uL   Hemoglobin 11.3 (L) 12.0 - 15.0 g/dL   HCT 29.539.6 62.136.0 - 30.846.0 %   MCV 73.3 (L) 80.0 - 100.0 fL   MCH 20.9 (L) 26.0 - 34.0 pg   MCHC 28.5 (L) 30.0 - 36.0 g/dL   RDW 65.722.4 (H) 84.611.5 - 96.215.5 %   Platelets 421 (H) 150 - 400 K/uL   nRBC  0.0 0.0 - 0.2 %    Comment: Performed at [facility], [address] [place], [place] [address]  I-Stat beta hCG blood, ED     Status: None   Collection Time: 10/03/20  3:54 AM  Result Value Ref Range   I-stat hCG, quantitative <5.0 <5 mIU/mL   Comment 3            Comment:   GEST. AGE      CONC.  (mIU/mL)   <=1 WEEK        5 - 50     2 WEEKS       50 - 500     3 WEEKS       100 - 10,000     4 WEEKS     1,000 - 30,000        FEMALE AND NON-PREGNANT FEMALE:     LESS THAN 5 mIU/mL   Resp Panel by RT-PCR (Flu A&B, Covid) Nasopharyngeal Swab     Status: None   Collection Time: 10/03/20  3:59 AM   Specimen: Nasopharyngeal Swab; Nasopharyngeal(NP) swabs in vial transport medium  Result Value Ref Range   SARS Coronavirus 2 by RT PCR NEGATIVE NEGATIVE    Comment: (NOTE) SARS-CoV-2 target nucleic acids are NOT DETECTED.  The SARS-CoV-2 RNA is generally detectable in upper respiratory specimens during the acute phase of infection. The lowest concentration of SARS-CoV-2 viral copies this assay can detect is 138 copies/mL. A negative result does not preclude SARS-Cov-2 infection and should not be used as the sole basis for treatment or other patient management decisions. A negative result may occur with  improper specimen collection/handling, submission of specimen other than nasopharyngeal swab, presence of viral mutation(s) within the areas targeted by this assay, and inadequate number of viral copies(<138 copies/mL). A negative result must be combined with clinical observations, patient history, and epidemiological information. The expected result  is Negative.  Fact Sheet for Patients:  BloggerCourse.com  Fact Sheet for Healthcare Providers:  SeriousBroker.it  This test is no t yet approved or cleared by the [place] FDA and  has been authorized for detection and/or diagnosis of SARS-CoV-2 by FDA under an  Emergency Use Authorization (EUA). This EUA will remain  in effect (meaning this test can be used) for the duration of the COVID-19 declaration under Section 564(b)(1) of the Act, 21 U.S.C.section 360bbb-3(b)(1), unless the authorization is terminated  or revoked sooner.       Influenza A by PCR NEGATIVE NEGATIVE   Influenza B by PCR NEGATIVE NEGATIVE    Comment: (NOTE) The Xpert Xpress SARS-CoV-2/FLU/RSV plus assay is intended as an aid in the diagnosis of influenza from Nasopharyngeal swab specimens and should not be used as a sole basis for treatment. Nasal washings and aspirates are unacceptable for Xpert Xpress SARS-CoV-2/FLU/RSV testing.  Fact Sheet for Patients: BloggerCourse.com  Fact Sheet for Healthcare Providers: SeriousBroker.it  This test is not yet approved or cleared by the [place] FDA and has been authorized for detection and/or diagnosis of SARS-CoV-2 by FDA under an Emergency Use Authorization (EUA). This EUA will remain in effect (meaning this test can be used) for the duration of the COVID-19 declaration under Section 564(b)(1) of the Act, 21 U.S.C. section 360bbb-3(b)(1), unless the authorization is terminated or revoked.  Performed at [facility], [address] [place], [place] [address]   Rapid urine drug screen (hospital performed)     Status: None   Collection Time: 10/03/20  5:00 AM  Result Value Ref Range   Opiates NONE DETECTED NONE DETECTED   Cocaine NONE DETECTED NONE DETECTED   Benzodiazepines NONE DETECTED NONE DETECTED   Amphetamines NONE DETECTED NONE DETECTED   Tetrahydrocannabinol NONE DETECTED NONE DETECTED   Barbiturates NONE DETECTED NONE DETECTED    Comment: (NOTE) DRUG SCREEN FOR MEDICAL PURPOSES ONLY.  IF CONFIRMATION IS NEEDED FOR ANY PURPOSE, NOTIFY LAB WITHIN 5 DAYS.  LOWEST DETECTABLE LIMITS FOR URINE DRUG SCREEN Drug Class                      Cutoff (ng/mL) Amphetamine and metabolites    1000 Barbiturate and metabolites    200 Benzodiazepine                 200 Tricyclics and metabolites     300 Opiates and metabolites        300 Cocaine and metabolites        300 THC                            50 Performed at [facility], [address], [place], [place] [address]     Blood Alcohol level:  Lab Results  Component Value Date   ETH <10 10/03/2020    Physical Findings: AIMS:  , ,  ,  , n/a CIWA:   n/a COWS:    n/a Musculoskeletal: Strength & Muscle Tone: within normal limits Gait & Station: did not visualize mobility Patient leans: N/A  Psychiatric Specialty Exam: Physical Exam Constitutional:      General: She is not in acute distress.    Appearance: She is obese.  Cardiovascular:     Rate and Rhythm: Normal rate.  Pulmonary:     Effort: Pulmonary effort is normal.  Neurological:     Mental Status: She is  alert and oriented to person, place, and time.  Psychiatric:        Attention and Perception: Attention normal.        Mood and Affect: Mood and affect normal.        Speech: Speech normal.        Behavior: Behavior is cooperative.        Thought Content: Thought content does not include homicidal or suicidal ideation. Thought content does not include homicidal or suicidal plan.        Cognition and Memory: Cognition is impaired (thoughts are not "clear" feels "cloudy" ).     Review of Systems  Respiratory: Negative for cough and shortness of breath.   Cardiovascular: Negative for chest pain.  Neurological: Negative for headaches.  Psychiatric/Behavioral: Negative for hallucinations, self-injury, sleep disturbance and suicidal ideas.    Blood pressure (!) 177/99, pulse 87, temperature 97.7 F (36.5 C), temperature source Oral, resp. rate 20, height 5\' 5"  (1.651 m), weight (!) 174.2 kg, SpO2 93 %.Body mass index is 63.9 kg/m.  General Appearance: Fairly Groomed  Eye Contact:   Minimal  Speech:  Clear and Coherent  Volume:  Normal  Mood:  Depressed  Affect:  Congruent  Thought Process:  Disorganized  Orientation:  Full (Time, Place, and Person)  Thought Content:  Logical  Suicidal Thoughts:  No  Homicidal Thoughts:  No  Memory:  Immediate;   Fair  Judgement:  Fair  Insight:  Fair  Psychomotor Activity:  Normal  Concentration:  Concentration: Fair  Recall:  Fair  Fund of Knowledge:  Good  Language:  Good  Akathisia:  No  Handed:  Right  AIMS (if indicated):     Assets:  Communication Skills Desire for Improvement  ADL's:  Impaired  Cognition:  WNL  Sleep:         Treatment Plan Summary: Daily contact with patient to assess and evaluate symptoms and progress in treatment and Medication management.  Continue to recommend inpatient psychiatric admission.  Continue current medications. Staff to monitor for safety.    , NP 10/03/2020, 12:37 PM
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
## 2020-10-03 NOTE — ED Notes (Signed)
Pt went back to the lobby to talk to her sister.

## 2020-10-03 NOTE — ED Notes (Signed)
Pt currently in restroom.

## 2020-10-03 NOTE — ED Notes (Signed)
Patient's mother would like to be contacted by psychiatry MD Verlon Au [phone]

## 2020-10-03 NOTE — BH Assessment (Signed)
[facility] Assessment Progress Note  Per Dorena Bodo, NP, this pt requires psychiatric hospitalization at this time.  At 15:26 Delaney Meigs calls from [facility].  Pt has been accepted to their facility by Dr Estill Cotta to their main campus.  They will be ready to receive pt after 08:00 tomorrow, Wednesday 10/04/2020.  EDP Mancel Bale, MD, concurs with this disposition, as does the pt who is currently under voluntary status.  Pt's nurse, Carollee Herter, has been notified.  Pt's nurse tomorrow morning will need to call report to [phone].  Pt is to be transported via [employer].  Doylene Canning, [place] [facility] Coordinator [phone]

## 2020-10-04 NOTE — ED Provider Notes (Signed)
Emergency Medicine Observation Re-evaluation Note  [facility] is a 45 y.o. female, seen on rounds today.  Pt initially presented to the ED for complaints of Medical Clearance Currently, the patient is resting comfortably.  Physical Exam  BP 135/88   Pulse 80   Temp 97.8 F (36.6 C)   Resp 17   Ht 5\' 5"  (1.651 m)   Wt (!) 174.2 kg   LMP  (LMP Unknown)   SpO2 100%   BMI 63.90 kg/m  Physical Exam General: resting comfortably   ED Course / MDM  EKG:EKG Interpretation  Date/Time:  Tuesday October 03 2020 03:34:32 EDT Ventricular Rate:  92 PR Interval:  202 QRS Duration: 96 QT Interval:  359 QTC Calculation: 445 R Axis:   43 Text Interpretation: Sinus rhythm Borderline prolonged PR interval Low voltage, precordial leads 12 Lead; Mason-Likar No acute changes Confirmed by 10-06-2005 [phone]) on 10/03/2020 4:43:16 AM   I have reviewed the labs performed to date as well as medications administered while in observation.  Recent changes in the last 24 hours include no acute events.  Plan  Current plan is for placement - [facility]. Patient is not under full IVC at this time.   [facility], MD 10/04/20 0900

## 2020-10-04 NOTE — ED Notes (Signed)
Pt DC d off unit to [facility] [place] per provider. Pt alert, calm, cooperative, no s/s of distress. DC information and belongings given to Tree surgeon for transport. Pt ambulatory. Pt in w/c off unit, escorted by NT. Pt transported by [employer].

## 2020-12-10 ENCOUNTER — Other Ambulatory Visit: Payer: Self-pay

## 2020-12-10 ENCOUNTER — Emergency Department (HOSPITAL_COMMUNITY): Payer: BLUE CROSS/BLUE SHIELD

## 2020-12-10 ENCOUNTER — Emergency Department (HOSPITAL_COMMUNITY)
Admission: EM | Admit: 2020-12-10 | Discharge: 2020-12-10 | Discharge status: Against Medical Advice | Payer: BLUE CROSS/BLUE SHIELD | Attending: Emergency Medicine | Admitting: Emergency Medicine

## 2020-12-10 ENCOUNTER — Encounter (HOSPITAL_COMMUNITY): Payer: Self-pay | Admitting: Emergency Medicine

## 2020-12-10 DIAGNOSIS — R42 Dizziness and giddiness: Secondary | ICD-10-CM | POA: Diagnosis not present

## 2020-12-10 DIAGNOSIS — F17210 Nicotine dependence, cigarettes, uncomplicated: Secondary | ICD-10-CM | POA: Diagnosis not present

## 2020-12-10 DIAGNOSIS — R4182 Altered mental status, unspecified: Secondary | ICD-10-CM | POA: Diagnosis not present

## 2020-12-10 DIAGNOSIS — R400 Somnolence: Secondary | ICD-10-CM | POA: Insufficient documentation

## 2020-12-10 DIAGNOSIS — I10 Essential (primary) hypertension: Secondary | ICD-10-CM | POA: Diagnosis not present

## 2020-12-10 DIAGNOSIS — R5383 Other fatigue: Secondary | ICD-10-CM | POA: Insufficient documentation

## 2020-12-10 LAB — CBG MONITORING, ED: Glucose-Capillary: 83 mg/dL (ref 70–99)

## 2020-12-10 NOTE — ED Notes (Addendum)
Pt continues to refuse any medical treatment. Pt leaving against medical advice. MD made aware and AMA form signed.

## 2020-12-10 NOTE — ED Notes (Signed)
Pt walked out of the room with a steady gait.

## 2020-12-10 NOTE — ED Notes (Signed)
Pt able to ambulate with no assistance and steady gait.
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
## 2020-12-10 NOTE — ED Provider Notes (Signed)
[facility]-EMERGENCY DEPT Provider Note   CSN: [id number] Arrival date & time: 12/10/20  1442     History Chief Complaint  Patient presents with   Altered Mental Status    Breanna Moore is a 45 y.o. female.  Patient presents with chief complaint of increased fatigue and sleepiness.  She states that she did not sleep well last night.  Per triage note, she had pulled over her vehicle due to not feeling right and then declined EMS travel and continued to drive and then states that she felt lightheaded and came into the ER for further assistance.  She denies any specific pains.  No headache no chest pain or abdominal pain.  No reports of fevers or cough or vomiting or diarrhea.  She states she uses a CPAP machine at night and has been using it regularly.      Past Medical History:  Diagnosis Date   BMI 60.0-69.9, adult (HCC)    Depression    Hypertension    Morbid obesity (HCC)    Non-toxic multinodular goiter 08/29/2019   CTA-Chest 08/28/19: Asymmetric enlargement thyroid lobes, L>R, extending retrosternal with slight mass effect upon the trachea and mild LEFT-to-RIGHT midline shift.    Patient Active Problem List   Diagnosis Date Noted   Hypoxia    Thyroid nodule greater than or equal to 1 cm in diameter incidentally noted on imaging study 09/02/2019   Thyroid nodule greater than or equal to 1.5 cm in diameter incidentally noted on imaging study 09/02/2019   Thyroid enlargement    Chest pain, atypical 08/29/2019   Acute on chronic respiratory failure with hypoxia and hypercapnia (HCC) 08/29/2019   Microcytic anemia 08/29/2019   Aneurysm of ascending aorta (HCC) 08/29/2019   Non-toxic multinodular goiter 08/29/2019   Atelectasis of both lungs 08/29/2019   Wheezing 08/29/2019   Nocturnal oxygen desaturation 08/29/2019   Obesity hypoventilation syndrome (HCC) 08/29/2019   LVH (left ventricular hypertrophy) 08/29/2019   Hypersomnolence 08/29/2019   BMI  60.0-69.9, adult (HCC)    Dyspnea 08/28/2019   OSA (obstructive sleep apnea) 08/28/2019   Shortness of breath on exertion 08/11/2017   Essential hypertension 08/11/2017   Morbid obesity (HCC) 08/11/2017   Cigarette smoker 08/11/2017   Schizoaffective disorder, bipolar type (HCC) 05/25/2014    Past Surgical History:  Procedure Laterality Date   CESAREAN SECTION       OB History   No obstetric history on file.     Family History  Problem Relation Age of Onset   Schizophrenia Mother     Social History   Tobacco Use   Smoking status: Every Day    Types: Cigarettes   Smokeless tobacco: Never  Vaping Use   Vaping Use: Never used  Substance Use Topics   Alcohol use: Yes    Alcohol/week: 0.0 standard drinks    Comment: occasional   Drug use: No    Home Medications Prior to Admission medications   Medication Sig Start Date End Date Taking? Authorizing Provider  hydrOXYzine (ATARAX/VISTARIL) 10 MG tablet Take 1 tablet (10 mg total) by mouth 3 (three) times daily as needed for anxiety. 09/08/19   Shirlean Mylar, MD  metroNIDAZOLE (FLAGYL) 500 MG tablet Take 1 tablet (500 mg total) by mouth 2 (two) times daily. Patient not taking: Reported on 10/03/2020 02/09/20   Merrilee Jansky, MD    Allergies    Hydrocodone-acetaminophen, Ketorolac, Paxil [paroxetine hcl], and Penicillins  Review of Systems   Review of Systems  Constitutional:  Negative for fever.  HENT:  Negative for ear pain.   Eyes:  Negative for pain.  Respiratory:  Negative for cough.   Cardiovascular:  Negative for chest pain.  Gastrointestinal:  Negative for abdominal pain.  Genitourinary:  Negative for flank pain.  Musculoskeletal:  Negative for back pain.  Skin:  Negative for rash.  Neurological:  Negative for headaches.   Physical Exam Updated Vital Signs BP (!) 157/103 (BP Location: Left Arm)   Pulse 79   Temp 98.2 F (36.8 C) (Oral)   Ht 5\' 5"  (1.651 m)   Wt (!) 174.2 kg   SpO2 93%   BMI  63.91 kg/m   Physical Exam Constitutional:      General: She is not in acute distress.    Appearance: Normal appearance.     Comments: Patient is sleeping on my arrival but will wake up to mild physical stimuli.  She will answer my questions but states that she is not sure why she feels so sleepy and lightheaded.  HENT:     Head: Normocephalic.     Nose: Nose normal.  Eyes:     Extraocular Movements: Extraocular movements intact.  Cardiovascular:     Rate and Rhythm: Normal rate.  Pulmonary:     Effort: Pulmonary effort is normal.  Musculoskeletal:        General: Normal range of motion.     Cervical back: Normal range of motion.  Neurological:     General: No focal deficit present.     Mental Status: Mental status is at baseline.     Comments: Patient sleepy but arousable, moving all extremities, following all commands.  She is able to stand up and ambulate across the hall without assistance or difficulty.    ED Results / Procedures / Treatments   Labs (all labs ordered are listed, but only abnormal results are displayed) Labs Reviewed  CBG MONITORING, ED    EKG None  Radiology CT Head Wo Contrast  Result Date: 12/10/2020 CLINICAL DATA:  Mental status change. EXAM: CT HEAD WITHOUT CONTRAST TECHNIQUE: Contiguous axial images were obtained from the base of the skull through the vertex without intravenous contrast. COMPARISON:  None. FINDINGS: Brain: No subdural, epidural, or subarachnoid hemorrhage. Ventricles and sulci are normal. Cerebellum, brainstem, and basal cisterns are normal. Mild white matter changes. No acute cortical ischemia or infarct. No mass effect or midline shift. Vascular: No hyperdense vessel or unexpected calcification. Skull: Normal. Negative for fracture or focal lesion. Sinuses/Orbits: No acute finding. Other: None. IMPRESSION: No acute intracranial abnormalities identified. Electronically Signed   By: 12/12/2020 III M.D.   On: 12/10/2020 15:56     Procedures Procedures   Medications Ordered in ED Medications - No data to display  ED Course  I have reviewed the triage vital signs and the nursing notes.  Pertinent labs & imaging results that were available during my care of the patient were reviewed by me and considered in my medical decision making (see chart for details).    MDM Rules/Calculators/A&P                           Labs and imaging and EKG ordered.  CT scan was done showing no acute findings per radiology.  EKG and labs pursued, however the patient states now that she is walked around she feels much better and declines further evaluation and wants to leave.  She has no complaints of  any pain or discomfort or lightheadedness.  Risks and benefits of leaving without completing medical studies discussed.  Possibility of missed arrhythmia lab abnormality or severe illness discussed.  Patient declines further studies stating that she will try to get some sleep at home.  Advised return at any time should she change her mind, otherwise leaving AGAINST MEDICAL ADVICE.  Final Clinical Impression(s) / ED Diagnoses Final diagnoses:  Fatigue, unspecified type    Rx / DC Orders ED Discharge Orders     None        Cheryll Cockayne, MD 12/10/20 1601

## 2020-12-10 NOTE — ED Notes (Signed)
Pt refusing blood work and EKG at this time. MD made aware.

## 2021-01-22 ENCOUNTER — Other Ambulatory Visit: Payer: Self-pay

## 2021-01-22 ENCOUNTER — Emergency Department (HOSPITAL_COMMUNITY)
Admission: EM | Admit: 2021-01-22 | Discharge: 2021-01-22 | Disposition: A | Discharge status: Home / Self Care | Payer: Self-pay | Attending: Emergency Medicine | Admitting: Emergency Medicine

## 2021-01-22 ENCOUNTER — Emergency Department (HOSPITAL_COMMUNITY): Payer: Self-pay

## 2021-01-22 ENCOUNTER — Encounter (HOSPITAL_COMMUNITY): Payer: Self-pay | Admitting: *Deleted

## 2021-01-22 DIAGNOSIS — R0602 Shortness of breath: Secondary | ICD-10-CM | POA: Insufficient documentation

## 2021-01-22 DIAGNOSIS — Z5321 Procedure and treatment not carried out due to patient leaving prior to being seen by health care provider: Secondary | ICD-10-CM | POA: Insufficient documentation

## 2021-01-22 HISTORY — DX: Sleep apnea, unspecified: G47.30

## 2021-01-22 LAB — BASIC METABOLIC PANEL
Anion gap: 10 (ref 5–15)
BUN: 9 mg/dL (ref 6–20)
CO2: 28 mmol/L (ref 22–32)
Calcium: 9 mg/dL (ref 8.9–10.3)
Chloride: 99 mmol/L (ref 98–111)
Creatinine, Ser: 0.79 mg/dL (ref 0.44–1.00)
GFR, Estimated: >60 mL/min (ref >60)
Glucose, Bld: 96 mg/dL (ref 70–99)
Potassium: 3.8 mmol/L (ref 3.5–5.1)
Sodium: 137 mmol/L (ref 135–145)

## 2021-01-22 LAB — CBC
HCT: 39 % (ref 36.0–46.0)
Hemoglobin: 10.7 g/dL — ABNORMAL LOW (ref 12.0–15.0)
MCH: 19.9 pg — ABNORMAL LOW (ref 26.0–34.0)
MCHC: 27.4 g/dL — ABNORMAL LOW (ref 30.0–36.0)
MCV: 72.4 fL — ABNORMAL LOW (ref 80.0–100.0)
Platelets: 451 10*3/uL — ABNORMAL HIGH (ref 150–400)
RBC: 5.39 MIL/uL — ABNORMAL HIGH (ref 3.87–5.11)
RDW: 21 % — ABNORMAL HIGH (ref 11.5–15.5)
WBC: 9 10*3/uL (ref 4.0–10.5)
nRBC: 0.3 % — ABNORMAL HIGH (ref 0.0–0.2)

## 2021-01-22 LAB — TROPONIN I (HIGH SENSITIVITY): Troponin I (High Sensitivity): 8 ng/L (ref <18)

## 2021-01-22 LAB — I-STAT BETA HCG BLOOD, ED (MC, WL, AP ONLY): I-stat hCG, quantitative: <5 m[IU]/mL (ref <5)

## 2021-01-22 NOTE — ED Notes (Addendum)
Pt overheard a visitor talking about wait times. Pt stated that she was not going to wait that long and asked a visitor to wheel her outside. Tried to explain to the pt that the wait time is based on acuity but no guarantee of exactly how many hours and encouraged pt to stay to be seen by a doctor. Pt refused and then pt proceeded to leave the ED.

## 2021-02-21 ENCOUNTER — Other Ambulatory Visit: Payer: Self-pay

## 2021-02-21 ENCOUNTER — Ambulatory Visit (INDEPENDENT_AMBULATORY_CARE_PROVIDER_SITE_OTHER): Payer: Self-pay | Admitting: *Deleted

## 2021-02-21 VITALS — BP 144/84 | HR 90

## 2021-02-21 DIAGNOSIS — N912 Amenorrhea, unspecified: Secondary | ICD-10-CM

## 2021-02-21 DIAGNOSIS — Z3202 Encounter for pregnancy test, result negative: Secondary | ICD-10-CM

## 2021-02-21 DIAGNOSIS — Z7689 Persons encountering health services in other specified circumstances: Secondary | ICD-10-CM

## 2021-02-21 LAB — POCT URINE PREGNANCY: Preg Test, Ur: NEGATIVE

## 2021-02-21 NOTE — Progress Notes (Signed)
Ms. Hise presents today for UPT. She reports irregular menses and can not say when LMP was. Reports enlarging abdomen and "breast changes" and "something is moving in my stomach".   LMP: unknown    OBJECTIVE: Dyspnea and difficulty with ambulation noted. Patient noted to be falling asleep in chair when seated for a short period of time. Patient reports functioning per baseline. Denies acute distress or emergent concerns. OB History   No obstetric history on file.    Home UPT Result: NA In-Office UPT result: Negative I have reviewed the patient's medical, obstetrical, social, and family histories, and medications.   ASSESSMENT: Negative pregnancy test  PLAN: Advised patient that UPT is negative. Ambulatory referral made to [facility]. Patient reports no insurance coverage and states does not qualify for Medicaid. States "I don't trust medical people" and is not sure she will follow up with referral for PCP. Encouraged patient to follow up with referral for PCP for further evaluation of current symptoms.

## 2021-03-15 ENCOUNTER — Emergency Department (HOSPITAL_COMMUNITY): Payer: Self-pay

## 2021-03-15 ENCOUNTER — Emergency Department (HOSPITAL_COMMUNITY)
Admission: EM | Admit: 2021-03-15 | Discharge: 2021-03-15 | Discharge status: Against Medical Advice | Payer: Self-pay | Attending: Emergency Medicine | Admitting: Emergency Medicine

## 2021-03-15 ENCOUNTER — Other Ambulatory Visit: Payer: Self-pay

## 2021-03-15 ENCOUNTER — Encounter (HOSPITAL_COMMUNITY): Payer: Self-pay | Admitting: Emergency Medicine

## 2021-03-15 DIAGNOSIS — Z20822 Contact with and (suspected) exposure to covid-19: Secondary | ICD-10-CM | POA: Insufficient documentation

## 2021-03-15 DIAGNOSIS — F17210 Nicotine dependence, cigarettes, uncomplicated: Secondary | ICD-10-CM | POA: Insufficient documentation

## 2021-03-15 DIAGNOSIS — N9489 Other specified conditions associated with female genital organs and menstrual cycle: Secondary | ICD-10-CM | POA: Insufficient documentation

## 2021-03-15 DIAGNOSIS — R1084 Generalized abdominal pain: Secondary | ICD-10-CM | POA: Insufficient documentation

## 2021-03-15 DIAGNOSIS — R079 Chest pain, unspecified: Secondary | ICD-10-CM | POA: Insufficient documentation

## 2021-03-15 DIAGNOSIS — I509 Heart failure, unspecified: Secondary | ICD-10-CM | POA: Insufficient documentation

## 2021-03-15 DIAGNOSIS — G43909 Migraine, unspecified, not intractable, without status migrainosus: Secondary | ICD-10-CM | POA: Insufficient documentation

## 2021-03-15 DIAGNOSIS — R0602 Shortness of breath: Secondary | ICD-10-CM | POA: Insufficient documentation

## 2021-03-15 DIAGNOSIS — Z79899 Other long term (current) drug therapy: Secondary | ICD-10-CM | POA: Insufficient documentation

## 2021-03-15 DIAGNOSIS — R519 Headache, unspecified: Secondary | ICD-10-CM | POA: Insufficient documentation

## 2021-03-15 DIAGNOSIS — I110 Hypertensive heart disease with heart failure: Secondary | ICD-10-CM | POA: Insufficient documentation

## 2021-03-15 LAB — COMPREHENSIVE METABOLIC PANEL
ALT: 11 U/L (ref 0–44)
AST: 14 U/L — ABNORMAL LOW (ref 15–41)
Albumin: 2.9 g/dL — ABNORMAL LOW (ref 3.5–5.0)
Alkaline Phosphatase: 69 U/L (ref 38–126)
Anion gap: 7 (ref 5–15)
BUN: 12 mg/dL (ref 6–20)
CO2: 28 mmol/L (ref 22–32)
Calcium: 8.7 mg/dL — ABNORMAL LOW (ref 8.9–10.3)
Chloride: 101 mmol/L (ref 98–111)
Creatinine, Ser: 0.89 mg/dL (ref 0.44–1.00)
GFR, Estimated: >60 mL/min (ref >60)
Glucose, Bld: 94 mg/dL (ref 70–99)
Potassium: 4.3 mmol/L (ref 3.5–5.1)
Sodium: 136 mmol/L (ref 135–145)
Total Bilirubin: 0.5 mg/dL (ref 0.3–1.2)
Total Protein: 8.5 g/dL — ABNORMAL HIGH (ref 6.5–8.1)

## 2021-03-15 LAB — CBC WITH DIFFERENTIAL/PLATELET
Abs Immature Granulocytes: 0.02 10*3/uL (ref 0.00–0.07)
Basophils Absolute: 0 10*3/uL (ref 0.0–0.1)
Basophils Relative: 0 %
Eosinophils Absolute: 0.1 10*3/uL (ref 0.0–0.5)
Eosinophils Relative: 2 %
HCT: 37.8 % (ref 36.0–46.0)
Hemoglobin: 10.2 g/dL — ABNORMAL LOW (ref 12.0–15.0)
Immature Granulocytes: 0 %
Lymphocytes Relative: 30 %
Lymphs Abs: 1.9 10*3/uL (ref 0.7–4.0)
MCH: 18.6 pg — ABNORMAL LOW (ref 26.0–34.0)
MCHC: 27 g/dL — ABNORMAL LOW (ref 30.0–36.0)
MCV: 68.9 fL — ABNORMAL LOW (ref 80.0–100.0)
Monocytes Absolute: 0.4 10*3/uL (ref 0.1–1.0)
Monocytes Relative: 6 %
Neutro Abs: 4 10*3/uL (ref 1.7–7.7)
Neutrophils Relative %: 62 %
Platelets: 338 10*3/uL (ref 150–400)
RBC: 5.49 MIL/uL — ABNORMAL HIGH (ref 3.87–5.11)
RDW: 22.5 % — ABNORMAL HIGH (ref 11.5–15.5)
WBC: 6.5 10*3/uL (ref 4.0–10.5)
nRBC: 0 % (ref 0.0–0.2)

## 2021-03-15 LAB — BRAIN NATRIURETIC PEPTIDE: B Natriuretic Peptide: 31.8 pg/mL (ref 0.0–100.0)

## 2021-03-15 LAB — TROPONIN I (HIGH SENSITIVITY): Troponin I (High Sensitivity): 9 ng/L (ref <18)

## 2021-03-15 LAB — RESP PANEL BY RT-PCR (FLU A&B, COVID) ARPGX2
Influenza A by PCR: NEGATIVE
Influenza B by PCR: NEGATIVE
SARS Coronavirus 2 by RT PCR: NEGATIVE

## 2021-03-15 LAB — HCG, QUANTITATIVE, PREGNANCY: hCG, Beta Chain, Quant, S: 1 m[IU]/mL (ref <5)

## 2021-03-15 MED ORDER — IOHEXOL 350 MG/ML SOLN: 100.0000 mL | Freq: Once | INTRAVENOUS | Status: DC | PRN

## 2021-03-15 MED ORDER — FUROSEMIDE 10 MG/ML IJ SOLN
40.0000 mg | Freq: Once | INTRAMUSCULAR | Status: DC
  Filled 2021-03-15: qty 4

## 2021-03-15 MED ORDER — FUROSEMIDE 40 MG PO TABS: 40.0000 mg | ORAL_TABLET | Freq: Every day | ORAL | 0 refills | Status: DC

## 2021-03-15 NOTE — ED Notes (Signed)
Attempted to obtain pt vitals signs. Pt verbally aggressive and cursing at staff. Asked pt nicely three time to not cuss at staff. Pt stated "i'm the pt I can act anyway I want." Pt stated Francesca Oman got the wrong one today. I'm gonna show you. What you gonna do call the police? " Pt taken to the lobby to wait for a room. [employer] made aware.

## 2021-03-15 NOTE — ED Notes (Signed)
Pt ambulatory to restroom

## 2021-03-15 NOTE — ED Notes (Signed)
Patient continues Environmental consultant.

## 2021-03-15 NOTE — ED Notes (Addendum)
Lab will add on HCG and BNP

## 2021-03-15 NOTE — ED Triage Notes (Addendum)
Patient brought in by [facility]. Patient is complaining of migraine for two weeks. She is also having left chest pain and nausea for two weeks.

## 2021-03-15 NOTE — ED Notes (Signed)
This Rn to room to draw additional blood work and give ordered lasix.  Pt refuses further treatment and is asking to see MD to get discharged.  When this RN attempts to talk with pt about  medications and treatments, pt refuses to discuss with RN any concerns.  This RN attempted to explain to pt the possible ramifications of leaving without treatment and pt again refuses to talk with RN.  Notified Dr. Particia Nearing of same.

## 2021-03-15 NOTE — ED Notes (Signed)
Pt walked from room, has pulled her own IV and is dressed.  Pt asking for "the exit" at this time. Pt escorted to lobby at this time.

## 2021-03-15 NOTE — ED Notes (Addendum)
Attempt to collect vitals, ekg and blood work pt refuse became upset and began to cuss at staff. Upon being asked to refrain from using profane language, pt became more irate and exclaimed, "I'm the patient, I can say whatever the hell I want' shit." " You act like this is a daycare. This is a hospital." "I can sit all day in the lobby. I'm going to show y'all today." "What are you going to do, call the damn police?" " I'm the patient, I can treat y'all however I want to." Pt was removed from triage and placed in lobby. Security has been notified.

## 2021-03-15 NOTE — ED Notes (Signed)
IV attempted x 2 without success.  IV team consult placed at this time.

## 2021-03-15 NOTE — ED Notes (Signed)
Pt called for room pt in restroom in the lobby

## 2021-03-15 NOTE — ED Provider Notes (Signed)
[facility]-EMERGENCY DEPT Provider Note   CSN: [id number] Arrival date & time: 03/15/21  0009     History Chief Complaint  Patient presents with   Migraine   Chest Pain    Breanna Moore is a 45 y.o. female.  Pt presents to the ED today with multiple complaints.  Pt is worried she is pregnant because she feels like she is in labor.  The pt also has a headache and sob.  Pt waited most of the night to be seen.  She was argumentative with the triage staff, but is cooperative now.  Pt denies f/c.       Past Medical History:  Diagnosis Date   BMI 60.0-69.9, adult (HCC)    Depression    Hypertension    Morbid obesity (HCC)    Non-toxic multinodular goiter 08/29/2019   CTA-Chest 08/28/19: Asymmetric enlargement thyroid lobes, L>R, extending retrosternal with slight mass effect upon the trachea and mild LEFT-to-RIGHT midline shift.   Sleep apnea     Patient Active Problem List   Diagnosis Date Noted   Hypoxia    Thyroid nodule greater than or equal to 1 cm in diameter incidentally noted on imaging study 09/02/2019   Thyroid nodule greater than or equal to 1.5 cm in diameter incidentally noted on imaging study 09/02/2019   Thyroid enlargement    Chest pain, atypical 08/29/2019   Acute on chronic respiratory failure with hypoxia and hypercapnia (HCC) 08/29/2019   Microcytic anemia 08/29/2019   Aneurysm of ascending aorta 08/29/2019   Non-toxic multinodular goiter 08/29/2019   Atelectasis of both lungs 08/29/2019   Wheezing 08/29/2019   Nocturnal oxygen desaturation 08/29/2019   Obesity hypoventilation syndrome (HCC) 08/29/2019   LVH (left ventricular hypertrophy) 08/29/2019   Hypersomnolence 08/29/2019   BMI 60.0-69.9, adult (HCC)    Dyspnea 08/28/2019   OSA (obstructive sleep apnea) 08/28/2019   Shortness of breath on exertion 08/11/2017   Essential hypertension 08/11/2017   Morbid obesity (HCC) 08/11/2017   Cigarette smoker 08/11/2017    Schizoaffective disorder, bipolar type (HCC) 05/25/2014    Past Surgical History:  Procedure Laterality Date   CESAREAN SECTION       OB History   No obstetric history on file.     Family History  Problem Relation Age of Onset   Schizophrenia Mother     Social History   Tobacco Use   Smoking status: Every Day    Types: Cigarettes   Smokeless tobacco: Never  Vaping Use   Vaping Use: Never used  Substance Use Topics   Alcohol use: Yes    Alcohol/week: 0.0 standard drinks    Comment: occasional   Drug use: No    Home Medications Prior to Admission medications   Medication Sig Start Date End Date Taking? Authorizing Provider  furosemide (LASIX) 40 MG tablet Take 1 tablet (40 mg total) by mouth daily. 03/15/21  Yes Jacalyn Lefevre, MD  albuterol (VENTOLIN HFA) 108 (90 Base) MCG/ACT inhaler Inhale 2 puffs into the lungs every 6 (six) hours as needed for shortness of breath. 10/07/20   [provider]  ARIPiprazole (ABILIFY) 10 MG tablet Take 10 mg by mouth daily. 03/12/21   [provider]  FEROSUL 325 (65 Fe) MG tablet Take 325 mg by mouth daily. 10/07/20   [provider]  FLUoxetine (PROZAC) 20 MG capsule Take 20 mg by mouth daily. 03/12/21   [provider]  hydrochlorothiazide (HYDRODIURIL) 25 MG tablet Take 25 mg by mouth daily.  10/13/20   [provider]  hydrOXYzine (VISTARIL) 25 MG capsule Take 25 mg by mouth every 6 (six) hours as needed for anxiety. 10/07/20   [provider]  lisinopril (ZESTRIL) 10 MG tablet Take 10 mg by mouth daily. 12/14/20   [provider]    Allergies    Hydrocodone-acetaminophen, Ketorolac, Paroxetine hcl, and Penicillins  Review of Systems   Review of Systems  Respiratory:  Positive for shortness of breath.   Cardiovascular:  Positive for chest pain.  Gastrointestinal:  Positive for abdominal pain.  Neurological:  Positive for headaches.  All other systems reviewed and are  negative.  Physical Exam Updated Vital Signs BP (!) 126/95   Pulse 91   Temp 98.6 F (37 C) (Oral)   Resp (!) 21   Ht 5\' 5"  (1.651 m)   Wt (!) 147.4 kg   LMP  (LMP Unknown)   SpO2 98%   BMI 54.08 kg/m   Physical Exam Vitals and nursing note reviewed.  Constitutional:      Appearance: She is well-developed. She is obese.  HENT:     Head: Normocephalic and atraumatic.  Eyes:     Extraocular Movements: Extraocular movements intact.     Pupils: Pupils are equal, round, and reactive to light.  Cardiovascular:     Rate and Rhythm: Normal rate and regular rhythm.     Heart sounds: Normal heart sounds.  Pulmonary:     Breath sounds: Rhonchi present.  Abdominal:     General: Bowel sounds are normal.     Palpations: Abdomen is soft.  Musculoskeletal:        General: Normal range of motion.     Cervical back: Normal range of motion and neck supple.  Skin:    General: Skin is warm.     Capillary Refill: Capillary refill takes less than 2 seconds.  Neurological:     General: No focal deficit present.     Mental Status: She is alert and oriented to person, place, and time.  Psychiatric:        Mood and Affect: Mood normal.        Behavior: Behavior normal.    ED Results / Procedures / Treatments   Labs (all labs ordered are listed, but only abnormal results are displayed) Labs Reviewed  CBC WITH DIFFERENTIAL/PLATELET - Abnormal; Notable for the following components:      Result Value   RBC 5.49 (*)    Hemoglobin 10.2 (*)    MCV 68.9 (*)    MCH 18.6 (*)    MCHC 27.0 (*)    RDW 22.5 (*)    All other components within normal limits  COMPREHENSIVE METABOLIC PANEL - Abnormal; Notable for the following components:   Calcium 8.7 (*)    Total Protein 8.5 (*)    Albumin 2.9 (*)    AST 14 (*)    All other components within normal limits  RESP PANEL BY RT-PCR (FLU A&B, COVID) ARPGX2  HCG, QUANTITATIVE, PREGNANCY  BRAIN NATRIURETIC PEPTIDE  URINALYSIS, ROUTINE W REFLEX  MICROSCOPIC  TSH  TROPONIN I (HIGH SENSITIVITY)  TROPONIN I (HIGH SENSITIVITY)    EKG EKG Interpretation  Date/Time:  Thursday March 15 2021 01:59:13 EST Ventricular Rate:  91 PR Interval:  203 QRS Duration: 83 QT Interval:  358 QTC Calculation: 441 R Axis:   107 Text Interpretation: Sinus rhythm Borderline prolonged PR interval Right axis deviation Confirmed by [id number] (Geoffery Lyons) on 03/15/2021 2:53:07 AM  Radiology DG  Chest 2 View  Result Date: 03/15/2021 CLINICAL DATA:  Chest pain. EXAM: CHEST - 2 VIEW COMPARISON:  Chest radiograph dated 01/22/2021. FINDINGS: There is cardiomegaly with vascular congestion and mild edema slightly progressed since the prior radiograph. Superimposed pneumonia is not excluded. Clinical correlation is recommended. No large pleural effusion. No pneumothorax. No acute osseous pathology. IMPRESSION: Cardiomegaly with findings of CHF. Superimposed pneumonia is not excluded. Electronically Signed   By: Elgie Collard M.D.   On: 03/15/2021 00:35    Procedures Procedures   Medications Ordered in ED Medications  furosemide (LASIX) injection 40 mg (has no administration in time range)    ED Course  I have reviewed the triage vital signs and the nursing notes.  Pertinent labs & imaging results that were available during my care of the patient were reviewed by me and considered in my medical decision making (see chart for details).    MDM Rules/Calculators/A&P                           Pt was a difficult stick for an IV.  When the IV team finally obtained the IV, she became angry and told the nurse to take it out.  The pt refused the lasix.  She refused her CT scans.  She does look like she has new onset CHF on CXR, but is oxygenating well.  This is likely partly due to her weight. Pt is requesting to leave.  I tried to speak with her, but she just started yelling.  She just kept saying to take out the IV.  Pt will have to go AMA.  A few weeks  ago, pt was referred to [facility] for pcp and she is referred to cards and put on lasix.  Final Clinical Impression(s) / ED Diagnoses Final diagnoses:  Congestive heart failure, unspecified HF chronicity, unspecified heart failure type (HCC)  Generalized abdominal pain  Acute nonintractable headache, unspecified headache type    Rx / DC Orders ED Discharge Orders          Ordered    furosemide (LASIX) 40 MG tablet  Daily        03/15/21 1002    Ambulatory referral to Cardiology        03/15/21 1002             Jacalyn Lefevre, MD 03/15/21 1006

## 2021-04-19 ENCOUNTER — Emergency Department (HOSPITAL_COMMUNITY)
Admission: EM | Admit: 2021-04-19 | Discharge: 2021-04-19 | Disposition: A | Discharge status: Home / Self Care | Payer: Self-pay | Attending: Emergency Medicine | Admitting: Emergency Medicine

## 2021-04-19 ENCOUNTER — Encounter (HOSPITAL_COMMUNITY): Payer: Self-pay | Admitting: *Deleted

## 2021-04-19 DIAGNOSIS — F209 Schizophrenia, unspecified: Secondary | ICD-10-CM | POA: Insufficient documentation

## 2021-04-19 DIAGNOSIS — R109 Unspecified abdominal pain: Secondary | ICD-10-CM | POA: Insufficient documentation

## 2021-04-19 DIAGNOSIS — Z5321 Procedure and treatment not carried out due to patient leaving prior to being seen by health care provider: Secondary | ICD-10-CM | POA: Insufficient documentation

## 2021-04-19 LAB — COMPREHENSIVE METABOLIC PANEL
ALT: 11 U/L (ref 0–44)
AST: 13 U/L — ABNORMAL LOW (ref 15–41)
Albumin: 2.9 g/dL — ABNORMAL LOW (ref 3.5–5.0)
Alkaline Phosphatase: 73 U/L (ref 38–126)
Anion gap: 7 (ref 5–15)
BUN: 5 mg/dL — ABNORMAL LOW (ref 6–20)
CO2: 29 mmol/L (ref 22–32)
Calcium: 9 mg/dL (ref 8.9–10.3)
Chloride: 97 mmol/L — ABNORMAL LOW (ref 98–111)
Creatinine, Ser: 0.89 mg/dL (ref 0.44–1.00)
GFR, Estimated: >60 mL/min (ref >60)
Glucose, Bld: 96 mg/dL (ref 70–99)
Potassium: 4 mmol/L (ref 3.5–5.1)
Sodium: 133 mmol/L — ABNORMAL LOW (ref 135–145)
Total Bilirubin: 0.4 mg/dL (ref 0.3–1.2)
Total Protein: 8.1 g/dL (ref 6.5–8.1)

## 2021-04-19 LAB — CBC
HCT: 41.7 % (ref 36.0–46.0)
Hemoglobin: 11.1 g/dL — ABNORMAL LOW (ref 12.0–15.0)
MCH: 18.2 pg — ABNORMAL LOW (ref 26.0–34.0)
MCHC: 26.6 g/dL — ABNORMAL LOW (ref 30.0–36.0)
MCV: 68.5 fL — ABNORMAL LOW (ref 80.0–100.0)
Platelets: 462 10*3/uL — ABNORMAL HIGH (ref 150–400)
RBC: 6.09 MIL/uL — ABNORMAL HIGH (ref 3.87–5.11)
RDW: 22.8 % — ABNORMAL HIGH (ref 11.5–15.5)
WBC: 8 10*3/uL (ref 4.0–10.5)
nRBC: 0 % (ref 0.0–0.2)

## 2021-04-19 LAB — URINALYSIS, ROUTINE W REFLEX MICROSCOPIC
Bilirubin Urine: NEGATIVE
Glucose, UA: NEGATIVE mg/dL
Hgb urine dipstick: NEGATIVE
Ketones, ur: NEGATIVE mg/dL
Nitrite: POSITIVE — AB
Protein, ur: NEGATIVE mg/dL
Specific Gravity, Urine: 1.009 (ref 1.005–1.030)
pH: 7 (ref 5.0–8.0)

## 2021-04-19 LAB — LIPASE, BLOOD: Lipase: 30 U/L (ref 11–51)

## 2021-04-19 LAB — I-STAT BETA HCG BLOOD, ED (MC, WL, AP ONLY): I-stat hCG, quantitative: <5 m[IU]/mL (ref <5)

## 2021-04-19 NOTE — ED Triage Notes (Signed)
Patient presents to ed via [facility] from home c/o abd. Pain  patient is asking everyone if they believe in Jesus, if not you can't take care of me. History of schz.

## 2021-05-21 ENCOUNTER — Emergency Department (HOSPITAL_COMMUNITY)
Admission: EM | Admit: 2021-05-21 | Discharge: 2021-05-21 | Disposition: A | Discharge status: Home / Self Care | Payer: 59 | Attending: Emergency Medicine | Admitting: Emergency Medicine

## 2021-05-21 ENCOUNTER — Emergency Department (HOSPITAL_COMMUNITY): Payer: 59

## 2021-05-21 ENCOUNTER — Encounter (HOSPITAL_COMMUNITY): Payer: Self-pay | Admitting: Radiology

## 2021-05-21 DIAGNOSIS — R519 Headache, unspecified: Secondary | ICD-10-CM | POA: Diagnosis not present

## 2021-05-21 DIAGNOSIS — Z79899 Other long term (current) drug therapy: Secondary | ICD-10-CM | POA: Diagnosis not present

## 2021-05-21 DIAGNOSIS — Z5321 Procedure and treatment not carried out due to patient leaving prior to being seen by health care provider: Secondary | ICD-10-CM | POA: Insufficient documentation

## 2021-05-21 DIAGNOSIS — R1011 Right upper quadrant pain: Secondary | ICD-10-CM | POA: Diagnosis not present

## 2021-05-21 LAB — COMPREHENSIVE METABOLIC PANEL
ALT: 14 U/L (ref 0–44)
AST: 12 U/L — ABNORMAL LOW (ref 15–41)
Albumin: 3.1 g/dL — ABNORMAL LOW (ref 3.5–5.0)
Alkaline Phosphatase: 73 U/L (ref 38–126)
Anion gap: 3 — ABNORMAL LOW (ref 5–15)
BUN: 13 mg/dL (ref 6–20)
CO2: 30 mmol/L (ref 22–32)
Calcium: 8.9 mg/dL (ref 8.9–10.3)
Chloride: 103 mmol/L (ref 98–111)
Creatinine, Ser: 0.7 mg/dL (ref 0.44–1.00)
GFR, Estimated: >60 mL/min (ref >60)
Glucose, Bld: 96 mg/dL (ref 70–99)
Potassium: 3.9 mmol/L (ref 3.5–5.1)
Sodium: 136 mmol/L (ref 135–145)
Total Bilirubin: 0.6 mg/dL (ref 0.3–1.2)
Total Protein: 7.8 g/dL (ref 6.5–8.1)

## 2021-05-21 LAB — CBC WITH DIFFERENTIAL/PLATELET
Abs Immature Granulocytes: 0.02 10*3/uL (ref 0.00–0.07)
Basophils Absolute: 0 10*3/uL (ref 0.0–0.1)
Basophils Relative: 0 %
Eosinophils Absolute: 0.1 10*3/uL (ref 0.0–0.5)
Eosinophils Relative: 1 %
HCT: 41.6 % (ref 36.0–46.0)
Hemoglobin: 11.4 g/dL — ABNORMAL LOW (ref 12.0–15.0)
Immature Granulocytes: 0 %
Lymphocytes Relative: 29 %
Lymphs Abs: 2.3 10*3/uL (ref 0.7–4.0)
MCH: 19 pg — ABNORMAL LOW (ref 26.0–34.0)
MCHC: 27.4 g/dL — ABNORMAL LOW (ref 30.0–36.0)
MCV: 69.3 fL — ABNORMAL LOW (ref 80.0–100.0)
Monocytes Absolute: 0.5 10*3/uL (ref 0.1–1.0)
Monocytes Relative: 6 %
Neutro Abs: 5.1 10*3/uL (ref 1.7–7.7)
Neutrophils Relative %: 64 %
Platelets: 388 10*3/uL (ref 150–400)
RBC: 6 MIL/uL — ABNORMAL HIGH (ref 3.87–5.11)
RDW: 24 % — ABNORMAL HIGH (ref 11.5–15.5)
WBC: 8 10*3/uL (ref 4.0–10.5)
nRBC: 0 % (ref 0.0–0.2)

## 2021-05-21 LAB — LIPASE, BLOOD: Lipase: 45 U/L (ref 11–51)
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
## 2021-05-21 NOTE — ED Notes (Signed)
Pt marched out of room and ws asking for the exit. Pt stated she was leaving. RN and PA attempted to have pt stay until her results are back. Pt continued to walk towards and the exit and stated she was leaving. PA is aware.
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
## 2021-05-21 NOTE — ED Notes (Signed)
Pt requesting to go outside. Explained to pt we need to do and EKG and blood work. Pt states I just want to leave. Explained the reason why she needs work up, pt agreed.   Pt transported to CT. Will obtain blood work and ekg once pt is back.

## 2021-05-24 ENCOUNTER — Other Ambulatory Visit: Payer: Self-pay

## 2021-05-24 ENCOUNTER — Emergency Department (HOSPITAL_COMMUNITY): Payer: 59

## 2021-05-24 ENCOUNTER — Emergency Department (HOSPITAL_COMMUNITY)
Admission: EM | Admit: 2021-05-24 | Discharge: 2021-05-25 | Disposition: A | Discharge status: Home / Self Care | Payer: 59 | Source: Home / Self Care | Attending: Emergency Medicine | Admitting: Emergency Medicine

## 2021-05-24 ENCOUNTER — Encounter (HOSPITAL_COMMUNITY): Payer: Self-pay

## 2021-05-24 DIAGNOSIS — R109 Unspecified abdominal pain: Secondary | ICD-10-CM | POA: Insufficient documentation

## 2021-05-24 DIAGNOSIS — R110 Nausea: Secondary | ICD-10-CM | POA: Insufficient documentation

## 2021-05-24 DIAGNOSIS — I517 Cardiomegaly: Secondary | ICD-10-CM | POA: Insufficient documentation

## 2021-05-24 DIAGNOSIS — R0789 Other chest pain: Secondary | ICD-10-CM

## 2021-05-24 DIAGNOSIS — J9602 Acute respiratory failure with hypercapnia: Secondary | ICD-10-CM | POA: Diagnosis not present

## 2021-05-24 DIAGNOSIS — N9489 Other specified conditions associated with female genital organs and menstrual cycle: Secondary | ICD-10-CM | POA: Insufficient documentation

## 2021-05-24 DIAGNOSIS — Z79899 Other long term (current) drug therapy: Secondary | ICD-10-CM | POA: Insufficient documentation

## 2021-05-24 DIAGNOSIS — J9690 Respiratory failure, unspecified, unspecified whether with hypoxia or hypercapnia: Secondary | ICD-10-CM | POA: Diagnosis not present

## 2021-05-24 LAB — CBC
HCT: 40.9 % (ref 36.0–46.0)
Hemoglobin: 11.4 g/dL — ABNORMAL LOW (ref 12.0–15.0)
MCH: 19.2 pg — ABNORMAL LOW (ref 26.0–34.0)
MCHC: 27.9 g/dL — ABNORMAL LOW (ref 30.0–36.0)
MCV: 68.7 fL — ABNORMAL LOW (ref 80.0–100.0)
Platelets: 388 10*3/uL (ref 150–400)
RBC: 5.95 MIL/uL — ABNORMAL HIGH (ref 3.87–5.11)
RDW: 24 % — ABNORMAL HIGH (ref 11.5–15.5)
WBC: 9.3 10*3/uL (ref 4.0–10.5)
nRBC: 0 % (ref 0.0–0.2)

## 2021-05-24 LAB — BASIC METABOLIC PANEL
Anion gap: 8 (ref 5–15)
BUN: 9 mg/dL (ref 6–20)
CO2: 26 mmol/L (ref 22–32)
Calcium: 9 mg/dL (ref 8.9–10.3)
Chloride: 98 mmol/L (ref 98–111)
Creatinine, Ser: 0.71 mg/dL (ref 0.44–1.00)
GFR, Estimated: >60 mL/min (ref >60)
Glucose, Bld: 82 mg/dL (ref 70–99)
Potassium: 3.6 mmol/L (ref 3.5–5.1)
Sodium: 132 mmol/L — ABNORMAL LOW (ref 135–145)

## 2021-05-24 LAB — URINALYSIS, ROUTINE W REFLEX MICROSCOPIC
Bilirubin Urine: NEGATIVE
Glucose, UA: NEGATIVE mg/dL
Ketones, ur: 5 mg/dL — AB
Leukocytes,Ua: NEGATIVE
Nitrite: NEGATIVE
Protein, ur: NEGATIVE mg/dL
Specific Gravity, Urine: 1.001 — ABNORMAL LOW (ref 1.005–1.030)
pH: 7 (ref 5.0–8.0)

## 2021-05-24 LAB — HCG, QUANTITATIVE, PREGNANCY: hCG, Beta Chain, Quant, S: <1 m[IU]/mL (ref <5)

## 2021-05-24 LAB — I-STAT BETA HCG BLOOD, ED (MC, WL, AP ONLY): I-stat hCG, quantitative: <5 m[IU]/mL (ref <5)

## 2021-05-24 LAB — TROPONIN I (HIGH SENSITIVITY): Troponin I (High Sensitivity): 5 ng/L (ref <18)

## 2021-05-24 MED ORDER — ONDANSETRON HCL 4 MG/2ML IJ SOLN
4.0000 mg | Freq: Once | INTRAMUSCULAR | Status: DC
Start: 2021-05-25 — End: 2021-05-25

## 2021-05-24 MED ORDER — SODIUM CHLORIDE 0.9 % IV BOLUS: 1000.0000 mL | Freq: Once | INTRAVENOUS | Status: DC

## 2021-05-24 MED ORDER — MORPHINE SULFATE (PF) 4 MG/ML IV SOLN: 4.0000 mg | Freq: Once | INTRAVENOUS | Status: DC

## 2021-05-24 NOTE — ED Triage Notes (Addendum)
Pt BIB EMS from home with reports of chest pain/epigastric pain and nausea x 1 day.   EMS bp 179/136

## 2021-05-24 NOTE — ED Provider Notes (Signed)
[facility]PT Provider Note   CSN: [id number] Arrival date & time: 05/24/21  1930     History  No chief complaint on file.   Breanna Moore is a 46 y.o. female.  Patient is a 46 year old female with past medical history of schizoaffective disorder, obesity, obstructive sleep apnea, ascending aortic aneurysm.  Patient presenting today with complaints of chest and abdominal pain.  This began yesterday and is worsening.  She describes nausea and dry heaves, but denies any shortness of breath, fevers, or diarrhea.  History somewhat limited as patient not willing to offer much history.    The history is provided by the patient.      Home Medications Prior to Admission medications   Medication Sig Start Date End Date Taking? Authorizing Provider  albuterol (VENTOLIN HFA) 108 (90 Base) MCG/ACT inhaler Inhale 2 puffs into the lungs every 6 (six) hours as needed for shortness of breath. 10/07/20   [provider]  ARIPiprazole (ABILIFY) 10 MG tablet Take 10 mg by mouth daily. 03/12/21   [provider]  FEROSUL 325 (65 Fe) MG tablet Take 325 mg by mouth daily. 10/07/20   [provider]  FLUoxetine (PROZAC) 20 MG capsule Take 20 mg by mouth daily. 03/12/21   [provider]  furosemide (LASIX) 40 MG tablet Take 1 tablet (40 mg total) by mouth daily. 03/15/21   Jacalyn LefevreHaviland, Julie, MD  hydrochlorothiazide (HYDRODIURIL) 25 MG tablet Take 25 mg by mouth daily. 10/13/20   [provider]  hydrOXYzine (VISTARIL) 25 MG capsule Take 25 mg by mouth every 6 (six) hours as needed for anxiety. 10/07/20   [provider]  lisinopril (ZESTRIL) 10 MG tablet Take 10 mg by mouth daily. 12/14/20   [provider]      Allergies    Hydrocodone-acetaminophen, Ketorolac, Paroxetine hcl, and Penicillins    Review of Systems   Review of Systems  All other systems reviewed and are negative.  Physical Exam Updated Vital  Signs BP (!) 148/86    Pulse 95    Temp 98.2 F (36.8 C) (Oral)    Resp 20    LMP  (LMP Unknown)    SpO2 98%  Physical Exam Vitals and nursing note reviewed.  Constitutional:      General: She is not in acute distress.    Appearance: She is well-developed. She is not diaphoretic.  HENT:     Head: Normocephalic and atraumatic.  Cardiovascular:     Rate and Rhythm: Normal rate and regular rhythm.     Heart sounds: No murmur heard.   No friction rub. No gallop.  Pulmonary:     Effort: Pulmonary effort is normal. No respiratory distress.     Breath sounds: Normal breath sounds. No wheezing.  Abdominal:     General: Bowel sounds are normal. There is no distension.     Palpations: Abdomen is soft.     Tenderness: There is abdominal tenderness. There is no guarding or rebound.  Musculoskeletal:        General: Normal range of motion.     Cervical back: Normal range of motion and neck supple.  Skin:    General: Skin is warm and dry.  Neurological:     General: No focal deficit present.     Mental Status: She is alert and oriented to person, place, and time.    ED Results / Procedures / Treatments   Labs (all labs ordered are listed, but only abnormal  results are displayed) Labs Reviewed  BASIC METABOLIC PANEL - Abnormal; Notable for the following components:      Result Value   Sodium 132 (*)    All other components within normal limits  CBC - Abnormal; Notable for the following components:   RBC 5.95 (*)    Hemoglobin 11.4 (*)    MCV 68.7 (*)    MCH 19.2 (*)    MCHC 27.9 (*)    RDW 24.0 (*)    All other components within normal limits  URINALYSIS, ROUTINE W REFLEX MICROSCOPIC - Abnormal; Notable for the following components:   Color, Urine COLORLESS (*)    Specific Gravity, Urine 1.001 (*)    Hgb urine dipstick SMALL (*)    Ketones, ur 5 (*)    Bacteria, UA RARE (*)    All other components within normal limits  HCG, QUANTITATIVE, PREGNANCY  HEPATIC FUNCTION PANEL   LIPASE, BLOOD  I-STAT BETA HCG BLOOD, ED ([facility], [facility], [facility] ONLY)  TROPONIN I (HIGH SENSITIVITY)  TROPONIN I (HIGH SENSITIVITY)    EKG EKG Interpretation  Date/Time:  Thursday May 24 2021 19:38:10 EST Ventricular Rate:  89 PR Interval:  207 QRS Duration: 86 QT Interval:  365 QTC Calculation: 445 R Axis:   94 Text Interpretation: Sinus rhythm Borderline prolonged PR interval Borderline right axis deviation Low voltage, precordial leads Baseline wander in lead(s) I III aVL No significant change since 05/22/2019 Confirmed by Geoffery Lyons ([id number]) on 05/24/2021 11:31:08 PM  Radiology DG Chest 2 View  Result Date: 05/24/2021 CLINICAL DATA:  Chest pain EXAM: CHEST - 2 VIEW COMPARISON:  03/15/2021 FINDINGS: Transverse diameter of heart is increased. Central pulmonary vessels are prominent. There is interval improvement in aeration of right lower lung fields. There is faint haziness in the right lower lung fields. Costophrenic angles are clear. There is no pneumothorax. There is extrinsic pressure over the left lateral margin of trachea in the lower neck suggesting enlarged thyroid. IMPRESSION: Cardiomegaly. Central pulmonary vessels are prominent suggesting CHF. Faint haziness in the right lower lung fields may suggest asymmetric pulmonary edema. There is no focal pulmonary consolidation. There is no pleural effusion. Electronically Signed   By: Ernie Avena M.D.   On: 05/24/2021 20:10    Procedures Procedures    Medications Ordered in ED Medications  sodium chloride 0.9 % bolus 1,000 mL (has no administration in time range)  ondansetron (ZOFRAN) injection 4 mg (has no administration in time range)  morphine 4 MG/ML injection 4 mg (has no administration in time range)    ED Course/ Medical Decision Making/ A&P  Patient is a 47 year old female presenting with chest and abdominal pain as described in the HPI.  Patient somewhat difficult to get a history from as she told me she was  tired and nauseated and did not feel like speaking with me.  She repeatedly put the covers over her head when I attempted to take a history and performing exam.  She also seemed very perturbed when I attempted to examine her abdomen.  I then exited the room and ordered CT scan of the chest, abdomen, and pelvis as well as medicine for pain and nausea.  Shortly afterward, the nurse informed me that the patient had cursed at her and refused to have the above tests and interventions performed.  I returned to the room to discuss her care and patient told me "I just want to get up out of here".  She told me she did not want  any medications or imaging studies and just wanted to go home and sleep.  Patient's request for discharge granted.  Final Clinical Impression(s) / ED Diagnoses Final diagnoses:  None    Rx / DC Orders ED Discharge Orders     None         Geoffery Lyons, MD 05/25/21 [phone]

## 2021-05-24 NOTE — ED Provider Triage Note (Signed)
Emergency Medicine Provider Triage Evaluation Note  Breanna Moore , a 46 y.o. female  was evaluated in triage.  Pt complains of chest pain and shortness of breath x 1 day.   Review of Systems  Positive: Chest pain, shortness of breath Negative: Fever, cough   Physical Exam  BP (!) 144/89 (BP Location: Left Arm)    Pulse 90    Temp 99.5 F (37.5 C) (Oral)    Resp (!) 23    LMP  (LMP Unknown)    SpO2 97%  Gen:   Awake, no distress   Resp:  Normal effort  MSK:   Moves extremities without difficulty  Other:    Medical Decision Making  Medically screening exam initiated at 7:48 PM.  Appropriate orders placed.  Delphia Anhalt was informed that the remainder of the evaluation will be completed by another provider, this initial triage assessment does not replace that evaluation, and the importance of remaining in the ED until their evaluation is complete.     Janeece Fitting, PA-C 05/24/21 1949

## 2021-05-24 NOTE — ED Notes (Signed)
Pt ambulatory (using wheelchair as a walker) in lobby and to treatment room.

## 2021-05-25 ENCOUNTER — Observation Stay (HOSPITAL_COMMUNITY): Payer: 59

## 2021-05-25 ENCOUNTER — Emergency Department (HOSPITAL_COMMUNITY)
Admission: EM | Admit: 2021-05-25 | Discharge: 2021-05-25 | Disposition: A | Discharge status: Home / Self Care | Payer: 59 | Source: Home / Self Care

## 2021-05-25 ENCOUNTER — Inpatient Hospital Stay (HOSPITAL_COMMUNITY)
Admission: EM | Admit: 2021-05-25 | Discharge: 2021-05-31 | DRG: 189 | Disposition: A | Discharge status: Home / Self Care | Payer: 59 | Attending: Internal Medicine | Admitting: Internal Medicine

## 2021-05-25 ENCOUNTER — Encounter (HOSPITAL_COMMUNITY): Payer: Self-pay | Admitting: Emergency Medicine

## 2021-05-25 ENCOUNTER — Emergency Department (HOSPITAL_COMMUNITY): Payer: 59

## 2021-05-25 ENCOUNTER — Ambulatory Visit (HOSPITAL_COMMUNITY): Admission: RE | Admit: 2021-05-25 | Payer: 59 | Source: Ambulatory Visit

## 2021-05-25 ENCOUNTER — Other Ambulatory Visit: Payer: Self-pay

## 2021-05-25 DIAGNOSIS — J9690 Respiratory failure, unspecified, unspecified whether with hypoxia or hypercapnia: Secondary | ICD-10-CM

## 2021-05-25 DIAGNOSIS — Z5321 Procedure and treatment not carried out due to patient leaving prior to being seen by health care provider: Secondary | ICD-10-CM | POA: Insufficient documentation

## 2021-05-25 DIAGNOSIS — Z888 Allergy status to other drugs, medicaments and biological substances status: Secondary | ICD-10-CM

## 2021-05-25 DIAGNOSIS — F23 Brief psychotic disorder: Secondary | ICD-10-CM

## 2021-05-25 DIAGNOSIS — J9602 Acute respiratory failure with hypercapnia: Principal | ICD-10-CM | POA: Diagnosis present

## 2021-05-25 DIAGNOSIS — E662 Morbid (severe) obesity with alveolar hypoventilation: Secondary | ICD-10-CM | POA: Diagnosis present

## 2021-05-25 DIAGNOSIS — R109 Unspecified abdominal pain: Secondary | ICD-10-CM | POA: Insufficient documentation

## 2021-05-25 DIAGNOSIS — G929 Unspecified toxic encephalopathy: Secondary | ICD-10-CM | POA: Diagnosis present

## 2021-05-25 DIAGNOSIS — I7121 Aneurysm of the ascending aorta, without rupture: Secondary | ICD-10-CM | POA: Diagnosis present

## 2021-05-25 DIAGNOSIS — Z79899 Other long term (current) drug therapy: Secondary | ICD-10-CM

## 2021-05-25 DIAGNOSIS — J9811 Atelectasis: Secondary | ICD-10-CM | POA: Diagnosis present

## 2021-05-25 DIAGNOSIS — I10 Essential (primary) hypertension: Secondary | ICD-10-CM | POA: Diagnosis not present

## 2021-05-25 DIAGNOSIS — Z880 Allergy status to penicillin: Secondary | ICD-10-CM

## 2021-05-25 DIAGNOSIS — R0602 Shortness of breath: Secondary | ICD-10-CM

## 2021-05-25 DIAGNOSIS — F259 Schizoaffective disorder, unspecified: Secondary | ICD-10-CM

## 2021-05-25 DIAGNOSIS — Z9114 Patient's other noncompliance with medication regimen: Secondary | ICD-10-CM

## 2021-05-25 DIAGNOSIS — Z885 Allergy status to narcotic agent status: Secondary | ICD-10-CM

## 2021-05-25 DIAGNOSIS — I5033 Acute on chronic diastolic (congestive) heart failure: Secondary | ICD-10-CM | POA: Diagnosis not present

## 2021-05-25 DIAGNOSIS — F609 Personality disorder, unspecified: Secondary | ICD-10-CM | POA: Diagnosis present

## 2021-05-25 DIAGNOSIS — F250 Schizoaffective disorder, bipolar type: Secondary | ICD-10-CM | POA: Diagnosis present

## 2021-05-25 DIAGNOSIS — F410 Panic disorder [episodic paroxysmal anxiety] without agoraphobia: Secondary | ICD-10-CM | POA: Diagnosis present

## 2021-05-25 DIAGNOSIS — Z91199 Patient's noncompliance with other medical treatment and regimen due to unspecified reason: Secondary | ICD-10-CM

## 2021-05-25 DIAGNOSIS — Z781 Physical restraint status: Secondary | ICD-10-CM

## 2021-05-25 DIAGNOSIS — Z6841 Body Mass Index (BMI) 40.0 and over, adult: Secondary | ICD-10-CM

## 2021-05-25 DIAGNOSIS — Z20822 Contact with and (suspected) exposure to covid-19: Secondary | ICD-10-CM | POA: Diagnosis present

## 2021-05-25 DIAGNOSIS — R0902 Hypoxemia: Secondary | ICD-10-CM

## 2021-05-25 DIAGNOSIS — F32A Depression, unspecified: Secondary | ICD-10-CM | POA: Diagnosis present

## 2021-05-25 DIAGNOSIS — F17210 Nicotine dependence, cigarettes, uncomplicated: Secondary | ICD-10-CM | POA: Diagnosis present

## 2021-05-25 DIAGNOSIS — I110 Hypertensive heart disease with heart failure: Secondary | ICD-10-CM | POA: Diagnosis present

## 2021-05-25 DIAGNOSIS — G4733 Obstructive sleep apnea (adult) (pediatric): Secondary | ICD-10-CM | POA: Diagnosis present

## 2021-05-25 DIAGNOSIS — Z91119 Patient's noncompliance with dietary regimen due to unspecified reason: Secondary | ICD-10-CM

## 2021-05-25 DIAGNOSIS — Z818 Family history of other mental and behavioral disorders: Secondary | ICD-10-CM

## 2021-05-25 LAB — CBC
HCT: 42.2 % (ref 36.0–46.0)
Hemoglobin: 12.1 g/dL (ref 12.0–15.0)
MCH: 19.2 pg — ABNORMAL LOW (ref 26.0–34.0)
MCHC: 28.7 g/dL — ABNORMAL LOW (ref 30.0–36.0)
MCV: 67.1 fL — ABNORMAL LOW (ref 80.0–100.0)
Platelets: 363 10*3/uL (ref 150–400)
RBC: 6.29 MIL/uL — ABNORMAL HIGH (ref 3.87–5.11)
RDW: 23.4 % — ABNORMAL HIGH (ref 11.5–15.5)
WBC: 8.5 10*3/uL (ref 4.0–10.5)
nRBC: 0 % (ref 0.0–0.2)

## 2021-05-25 LAB — CBC WITH DIFFERENTIAL/PLATELET
Abs Immature Granulocytes: 0.03 10*3/uL (ref 0.00–0.07)
Basophils Absolute: 0 10*3/uL (ref 0.0–0.1)
Basophils Relative: 0 %
Eosinophils Absolute: 0 10*3/uL (ref 0.0–0.5)
Eosinophils Relative: 0 %
HCT: 40 % (ref 36.0–46.0)
Hemoglobin: 10.9 g/dL — ABNORMAL LOW (ref 12.0–15.0)
Immature Granulocytes: 0 %
Lymphocytes Relative: 22 %
Lymphs Abs: 2.1 10*3/uL (ref 0.7–4.0)
MCH: 18.6 pg — ABNORMAL LOW (ref 26.0–34.0)
MCHC: 27.3 g/dL — ABNORMAL LOW (ref 30.0–36.0)
MCV: 68.4 fL — ABNORMAL LOW (ref 80.0–100.0)
Monocytes Absolute: 0.6 10*3/uL (ref 0.1–1.0)
Monocytes Relative: 6 %
Neutro Abs: 7 10*3/uL (ref 1.7–7.7)
Neutrophils Relative %: 72 %
Platelets: 360 10*3/uL (ref 150–400)
RBC: 5.85 MIL/uL — ABNORMAL HIGH (ref 3.87–5.11)
RDW: 23.4 % — ABNORMAL HIGH (ref 11.5–15.5)
Smear Review: ADEQUATE
WBC: 9.8 10*3/uL (ref 4.0–10.5)
nRBC: 0 % (ref 0.0–0.2)

## 2021-05-25 LAB — I-STAT ARTERIAL BLOOD GAS, ED
Acid-Base Excess: 0 mmol/L (ref 0.0–2.0)
Bicarbonate: 29.1 mmol/L — ABNORMAL HIGH (ref 20.0–28.0)
Calcium, Ion: 1.29 mmol/L (ref 1.15–1.40)
HCT: 39 % (ref 36.0–46.0)
Hemoglobin: 13.3 g/dL (ref 12.0–15.0)
O2 Saturation: 100 %
Patient temperature: 98.6
Potassium: 3.5 mmol/L (ref 3.5–5.1)
Sodium: 139 mmol/L (ref 135–145)
TCO2: 31 mmol/L (ref 22–32)
pCO2 arterial: 66 mmHg (ref 32.0–48.0)
pH, Arterial: 7.252 — ABNORMAL LOW (ref 7.350–7.450)
pO2, Arterial: 210 mmHg — ABNORMAL HIGH (ref 83.0–108.0)

## 2021-05-25 LAB — ETHANOL
Alcohol, Ethyl (B): <10 mg/dL (ref <10)
Alcohol, Ethyl (B): <10 mg/dL (ref <10)

## 2021-05-25 LAB — HEPATIC FUNCTION PANEL
ALT: 14 U/L (ref 0–44)
AST: 17 U/L (ref 15–41)
Albumin: 3.4 g/dL — ABNORMAL LOW (ref 3.5–5.0)
Alkaline Phosphatase: 71 U/L (ref 38–126)
Bilirubin, Direct: <0.1 mg/dL (ref 0.0–0.2)
Total Bilirubin: 0.7 mg/dL (ref 0.3–1.2)
Total Protein: 8.6 g/dL — ABNORMAL HIGH (ref 6.5–8.1)

## 2021-05-25 LAB — COMPREHENSIVE METABOLIC PANEL
ALT: 14 U/L (ref 0–44)
ALT: 14 U/L (ref 0–44)
AST: 14 U/L — ABNORMAL LOW (ref 15–41)
AST: 17 U/L (ref 15–41)
Albumin: 3.4 g/dL — ABNORMAL LOW (ref 3.5–5.0)
Albumin: 3.1 g/dL — ABNORMAL LOW (ref 3.5–5.0)
Alkaline Phosphatase: 76 U/L (ref 38–126)
Alkaline Phosphatase: 71 U/L (ref 38–126)
Anion gap: 8 (ref 5–15)
Anion gap: 11 (ref 5–15)
BUN: 7 mg/dL (ref 6–20)
BUN: 6 mg/dL (ref 6–20)
CO2: 26 mmol/L (ref 22–32)
CO2: 24 mmol/L (ref 22–32)
Calcium: 9.3 mg/dL (ref 8.9–10.3)
Calcium: 9.1 mg/dL (ref 8.9–10.3)
Chloride: 101 mmol/L (ref 98–111)
Chloride: 102 mmol/L (ref 98–111)
Creatinine, Ser: 0.75 mg/dL (ref 0.44–1.00)
Creatinine, Ser: 0.72 mg/dL (ref 0.44–1.00)
GFR, Estimated: >60 mL/min (ref >60)
GFR, Estimated: >60 mL/min (ref >60)
Glucose, Bld: 93 mg/dL (ref 70–99)
Glucose, Bld: 89 mg/dL (ref 70–99)
Potassium: 3.6 mmol/L (ref 3.5–5.1)
Potassium: 3.7 mmol/L (ref 3.5–5.1)
Sodium: 135 mmol/L (ref 135–145)
Sodium: 137 mmol/L (ref 135–145)
Total Bilirubin: 0.6 mg/dL (ref 0.3–1.2)
Total Bilirubin: 0.7 mg/dL (ref 0.3–1.2)
Total Protein: 8.7 g/dL — ABNORMAL HIGH (ref 6.5–8.1)
Total Protein: 7.7 g/dL (ref 6.5–8.1)

## 2021-05-25 LAB — ACETAMINOPHEN LEVEL
Acetaminophen (Tylenol), Serum: <10 ug/mL — ABNORMAL LOW (ref 10–30)
Acetaminophen (Tylenol), Serum: <10 ug/mL — ABNORMAL LOW (ref 10–30)

## 2021-05-25 LAB — SALICYLATE LEVEL
Salicylate Lvl: <7 mg/dL — ABNORMAL LOW (ref 7.0–30.0)
Salicylate Lvl: <7 mg/dL — ABNORMAL LOW (ref 7.0–30.0)

## 2021-05-25 LAB — I-STAT BETA HCG BLOOD, ED (MC, WL, AP ONLY): I-stat hCG, quantitative: <5 m[IU]/mL (ref <5)

## 2021-05-25 LAB — RESP PANEL BY RT-PCR (FLU A&B, COVID) ARPGX2
Influenza A by PCR: NEGATIVE
Influenza B by PCR: NEGATIVE
SARS Coronavirus 2 by RT PCR: NEGATIVE

## 2021-05-25 LAB — LIPASE, BLOOD: Lipase: 30 U/L (ref 11–51)

## 2021-05-25 MED ORDER — ZIPRASIDONE MESYLATE 20 MG IM SOLR
20.0000 mg | Freq: Once | INTRAMUSCULAR | Status: AC
Start: 2021-05-25 — End: 2021-05-25
  Administered 2021-05-25: 20 mg via INTRAMUSCULAR

## 2021-05-25 MED ORDER — ZIPRASIDONE MESYLATE 20 MG IM SOLR
10.0000 mg | Freq: Once | INTRAMUSCULAR | Status: DC
  Filled 2021-05-25: qty 20

## 2021-05-25 NOTE — ED Notes (Signed)
Pt finally used bedside commode. Pt drinking from sink again. Pt firmly redirected to bed. Facilities is at bedside cutting off water to sink.
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
## 2021-05-25 NOTE — ED Triage Notes (Signed)
Pt has already checked in several times today, is continuing to malinger, urinate on floor in bathroom, walk around lobby making demands of staff. Advised by social work and [employer] that we should IVC the patient.

## 2021-05-25 NOTE — ED Notes (Signed)
Sats noted to be low-mid 80's in 3L while sleeping.  Pt presents like OSA while sleeping.  02 bumped to 4L.  Pt satting 96-98%.

## 2021-05-25 NOTE — ED Notes (Signed)
Pt's oxygen saturation noted to be in the 50's on room air by Fredric Mare, [employer] fundraiser. Non-rebreather placed on pt at 15L/min; ED provider and respiratory therapist called to bedside. Oxygen improved to 95%. Plan to move pt into room 5 for medical monitoring.

## 2021-05-25 NOTE — ED Provider Notes (Signed)
°  Physical Exam  BP (!) 177/90    Pulse (!) 109    Temp 97.8 F (36.6 C) (Oral)    Resp (!) 23    LMP  (LMP Unknown)    SpO2 96%   Physical Exam  Procedures  Procedures  ED Course / MDM    Medical Decision Making Patient was medically cleared earlier.  I was called into the room around 10 PM.  Patient apparently was snoring and oxygen saturation was in the 50s.  Patient was given Geodon around 6 PM.  Patient was on a nonrebreather when I went to see her.  She is starting to become more awake.  Patient initially has pinpoint pupils.  We moved her to the medical side.  Respiratory therapist came to bedside.  We were able to obtain an ABG.  After we got an ABG, patient fully woke up and oxygen saturation was 100%.  She then was yelling and screaming and states that the medicine made her feel funny.  Patient then proceeded to try to get up out of the bed.  At that point we called security to bedside.  Since she is not following commands, I ordered restraints on the patient.   10:52 PM Patient's ABG showed pH of 7.25 with CO2 of 66.  I noticed that she had labs drawn earlier today and her CBC and CMP and tox were negative.  Patient also was seen several times last several days.  She had a negative CT head several days ago with the same behavior.  At this point, I do not know if she had a medication side effect or she had sleep apnea.    10:56 PM Patient desat again to 28s. Put on 3 L [place].  I wonder if she has sleep apnea or this is a medication side effect.  Repeat labs unremarkable.  Given that she has new oxygen requirement, at this point will admit for observation.  Psychiatry consult still pending.  CRITICAL CARE Performed by: Richardean Canal   Total critical care time: 30 minutes  Critical care time was exclusive of separately billable procedures and treating other patients.  Critical care was necessary to treat or prevent imminent or life-threatening deterioration.  Critical care was time  spent personally by me on the following activities: development of treatment plan with patient and/or surrogate as well as nursing, discussions with consultants, evaluation of patient's response to treatment, examination of patient, obtaining history from patient or surrogate, ordering and performing treatments and interventions, ordering and review of laboratory studies, ordering and review of radiographic studies, pulse oximetry and re-evaluation of patient's condition.   Problems Addressed: Hypoxia: acute illness or injury  Amount and/or Complexity of Data Reviewed External Data Reviewed: labs and notes. Labs: ordered. Decision-making details documented in ED Course. Radiology: ordered and independent interpretation performed. Decision-making details documented in ED Course. ECG/medicine tests: ordered.  Risk Prescription drug management. Decision regarding hospitalization.        Charlynne Pander, MD 05/25/21 [phone]

## 2021-05-25 NOTE — ED Notes (Signed)
Pt cooperative with RN. Pt urinated in the floor RN educated that for her safety she must use the bedside commode so that she does not slip. Pt laid down and is resting. Urine cleaned and dried for pt safety

## 2021-05-25 NOTE — ED Notes (Signed)
Pt changed from violent restraints to 4-point soft restraints.

## 2021-05-25 NOTE — Discharge Instructions (Signed)
Continue medications as previously prescribed.  You may return to the emergency department at any time should you desire the tests and medications discussed, or if symptoms worsen or change.

## 2021-05-25 NOTE — ED Notes (Signed)
Went to introduce myself to the patient. Patient was  incontinent of urine. Advised the patient that she needed to be changed. Patient immediately refused and began using explicit words. I advised the patient I was only here to help. Patient stated to get her some Ice chips using explicit language.

## 2021-05-25 NOTE — H&P (Addendum)
History and Physical    Breanna Moore [id number] DOB: 11-26-1975 DOA: 05/25/2021  PCP: Andria Frames, PA-C  Patient coming from: Home via EMS   Chief Complaint:  Chief Complaint  Patient presents with   Psychiatric Evaluation     HPI:    46 year old female with past medical history of schizoaffective disorder, hypertension, morbid obesity, obesity hypoventilation syndrome (has followed with Dr. Camillo Flaming with [facility] Pulmonology in the past), restrictive lung disease, severe obstructive sleep apnea (PSG 10/2019 AHI 63) who presented to [facility] emergency department via EMS with complaints of chest and abdominal pain.    Unfortunately, due to erratic behavior is unable to provide an accurate history.  Majority the history has been obtained from review of EMS notes and discussion with emergency department staff.    According to the mother, patient has exhibited increasingly erratic and disorganized behavior since approximately July 2022 when the patient's uncle died.  Since then, the patient has not followed up with her healthcare providers nor has she consistently taken medications or used her BiPAP device.    On 1/27, according to EMS, they had responded to the patient's residence and found her sitting in her dining room exhibiting lethargy confusion and disorientation.  When asked questions the patient began to exhibit erratic behavior, removing her clothes and continually drinking water throughout the EMS visit.  Patient eventually began to complain of chest and back pain.  Once patient was secured she urinated on the stretcher twice while in route to the hospital.  Upon arrival to the emergency department patient continued to exhibit bizarre behavior, pacing around the waiting room, confronting staff and urinating on the floor.  After [employer] and case management involvement, the decision was made by the emergency department staff to involuntarily commit the patient.  ER  provider reports that the have requested a psychiatry consultation but they have yet to evaluate the patient at the bedside.  ER provider reports that patient had to receive 2 separate doses of as needed Geodon due to extremely agitated behavior and shortly thereafter began to exhibit bouts of hypoxia.  This prompted placing the patient on a nonrebreather mask and obtaining an ABG which revealed a pH of 7.25 with PCO2 of 66 and PO2 of 210.  Due to patient's respiratory failure the hospital scrip was then called to assess the patient for mission to the hospital.    Review of Systems:   Review of Systems  Unable to perform ROS: Mental status change   Past Medical History:  Diagnosis Date   BMI 60.0-69.9, adult ([place])    Depression    Hypertension    Morbid obesity ([place])    Non-toxic multinodular goiter 08/29/2019   CTA-Chest 08/28/19: Asymmetric enlargement thyroid lobes, L>R, extending retrosternal with slight mass effect upon the trachea and mild LEFT-to-RIGHT midline shift.   Sleep apnea     Past Surgical History:  Procedure Laterality Date   CESAREAN SECTION       reports that she has been smoking cigarettes. She has never used smokeless tobacco. She reports current alcohol use. She reports that she does not use drugs.  Allergies  Allergen Reactions   Geodon [Ziprasidone Hcl] Other (See Comments)    Patient became hypoxic and required nonrebreather. Please avoid or try low dose    Hydrocodone-Acetaminophen    Ketorolac    Paroxetine Hcl     Other reaction(s): Confusion   Penicillins Rash    Has patient had a PCN reaction  causing immediate rash, facial/tongue/throat swelling, SOB or lightheadedness with hypotension: Yes Has patient had a PCN reaction causing severe rash involving mucus membranes or skin necrosis: No Has patient had a PCN reaction that required hospitalization: No Has patient had a PCN reaction occurring within the last 10 years: No If all of the above answers  are "NO", then may proceed with Cephalosporin use.     Family History  Problem Relation Age of Onset   Schizophrenia Mother      Prior to Admission medications   Medication Sig Start Date End Date Taking? Authorizing Provider  albuterol (VENTOLIN HFA) 108 (90 Base) MCG/ACT inhaler Inhale 2 puffs into the lungs every 6 (six) hours as needed for shortness of breath. 10/07/20   [provider]  ARIPiprazole (ABILIFY) 10 MG tablet Take 10 mg by mouth daily. 03/12/21   [provider]  FEROSUL 325 (65 Fe) MG tablet Take 325 mg by mouth daily. 10/07/20   [provider]  FLUoxetine (PROZAC) 20 MG capsule Take 20 mg by mouth daily. 03/12/21   [provider]  furosemide (LASIX) 40 MG tablet Take 1 tablet (40 mg total) by mouth daily. 03/15/21   Isla Pence, MD  hydrochlorothiazide (HYDRODIURIL) 25 MG tablet Take 25 mg by mouth daily. 10/13/20   [provider]  hydrOXYzine (VISTARIL) 25 MG capsule Take 25 mg by mouth every 6 (six) hours as needed for anxiety. 10/07/20   [provider]  lisinopril (ZESTRIL) 10 MG tablet Take 10 mg by mouth daily. 12/14/20   [provider]    Physical Exam: Vitals:   05/26/21 0330 05/26/21 0415 05/26/21 0425 05/26/21 0435  BP: 130/63  121/69 121/69  Pulse: 100 92 94 97  Resp: (!) 24   (!) 23  Temp: 98.1 F (36.7 C)   98.4 F (36.9 C)  TempSrc: Axillary   Axillary  SpO2: 96% 95% 97% 99%  Height:        Constitutional: Lethargic but arousable with bouts of agitation, patient is obese  Skin: no rashes, no lesions, good skin turgor noted. Eyes: Pupils are equally reactive to light.  No evidence of scleral icterus or conjunctival pallor.  ENMT: BiPAP mask in place.   Neck: normal, supple, no masses, no thyromegaly.  Unable to assess jugular venous pulse due to body habitus cor respiratory: Breath sounds with scattered rhonchi bilaterally and rales noted in the bilateral mid and lower fields.   Notable intermittent mild expiratory wheezing scattered throughout lung fields.  Normal respiratory effort. No accessory muscle use.  Cardiovascular: Tachycardic rate with regular rhythm.  No murmurs / rubs / gallops.  Distal bilateral lower extremity pitting edema.  2+ pedal pulses. No carotid bruits.  Chest:   Nontender without crepitus or deformity.   Back:   Nontender without crepitus or deformity. Abdomen: Abdomen is protuberant but soft and nontender.  No evidence of intra-abdominal masses.  Positive bowel sounds noted in all quadrants.   Musculoskeletal: No joint deformity upper and lower extremities. Good ROM, no contractures. Normal muscle tone.  Neurologic: Patient is not following commands.  Patient is moving all 4 extremities spontaneously.  Patient is responsive to painful stimuli and does localize to pain.   Psychiatric: Patient has exhibited bizarre behavior throughout the emergency department stay with bouts of extreme agitation.  Currently, patient is lethargic and unable to participate with interview.  Patient currently does not possess insight as to her current situation.    Labs on Admission: I have  personally reviewed following labs and imaging studies -   CBC: Recent Labs  Lab 05/21/21 1408 05/24/21 1942 05/25/21 1136 05/25/21 2211 05/25/21 2240 05/26/21 0252 05/26/21 0443  WBC 8.0 9.3 8.5  --  9.8  --  8.0  NEUTROABS 5.1  --   --   --  7.0  --  6.2  HGB 11.4* 11.4* 12.1 13.3 10.9* 13.3 10.8*  HCT 41.6 40.9 42.2 39.0 40.0 39.0 39.3  MCV 69.3* 68.7* 67.1*  --  68.4*  --  68.9*  PLT 388 388 363  --  360  --  [id number]   Basic Metabolic Panel: Recent Labs  Lab 05/21/21 1408 05/24/21 1942 05/25/21 1136 05/25/21 2211 05/25/21 2240 05/26/21 0252 05/26/21 0443  NA 136 132* 135 139 137 141 140  K 3.9 3.6 3.6 3.5 3.7 4.2 4.6  CL 103 98 101  --  102  --  104  CO2 30 26 26   --  24  --  27  GLUCOSE 96 82 93  --  89  --  77  BUN 13 9 7   --  6  --  6  CREATININE 0.70  0.71 0.75  --  0.72  --  0.73  CALCIUM 8.9 9.0 9.3  --  9.1  --  8.9  MG  --   --   --   --   --   --  1.9   GFR: CrCl cannot be calculated (Unknown ideal weight.). Liver Function Tests: Recent Labs  Lab 05/21/21 1408 05/24/21 1942 05/25/21 1136 05/25/21 2240 05/26/21 0443  AST 12* 17 14* 17 21  ALT 14 14 14 14 15   ALKPHOS 73 71 76 71 68  BILITOT 0.6 0.7 0.6 0.7 1.0  PROT 7.8 8.6* 8.7* 7.7 7.2  ALBUMIN 3.1* 3.4* 3.4* 3.1* 2.8*   Recent Labs  Lab 05/21/21 1408 05/24/21 1942  LIPASE 45 30   No results for input(s): AMMONIA in the last 168 hours. Coagulation Profile: No results for input(s): INR, PROTIME in the last 168 hours. Cardiac Enzymes: No results for input(s): CKTOTAL, CKMB, CKMBINDEX, TROPONINI in the last 168 hours. BNP (last 3 results) No results for input(s): PROBNP in the last 8760 hours. HbA1C: No results for input(s): HGBA1C in the last 72 hours. CBG: No results for input(s): GLUCAP in the last 168 hours. Lipid Profile: No results for input(s): CHOL, HDL, LDLCALC, TRIG, CHOLHDL, LDLDIRECT in the last 72 hours. Thyroid Function Tests: No results for input(s): TSH, T4TOTAL, FREET4, T3FREE, THYROIDAB in the last 72 hours. Anemia Panel: No results for input(s): VITAMINB12, FOLATE, FERRITIN, TIBC, IRON, RETICCTPCT in the last 72 hours. Urine analysis:    Component Value Date/Time   COLORURINE COLORLESS (A) 05/24/2021 2054   APPEARANCEUR CLEAR 05/24/2021 2054   LABSPEC 1.001 (L) 05/24/2021 2054   PHURINE 7.0 05/24/2021 2054   GLUCOSEU NEGATIVE 05/24/2021 2054   HGBUR SMALL (A) 05/24/2021 2054   BILIRUBINUR NEGATIVE 05/24/2021 2054   KETONESUR 5 (A) 05/24/2021 2054   PROTEINUR NEGATIVE 05/24/2021 2054   NITRITE NEGATIVE 05/24/2021 2054   LEUKOCYTESUR NEGATIVE 05/24/2021 2054    Radiological Exams on Admission - Personally Reviewed: DG Chest 2 View  Result Date: 05/24/2021 CLINICAL DATA:  Chest pain EXAM: CHEST - 2 VIEW COMPARISON:  03/15/2021  FINDINGS: Transverse diameter of heart is increased. Central pulmonary vessels are prominent. There is interval improvement in aeration of right lower lung fields. There is faint haziness in the right lower lung fields. Costophrenic angles are  clear. There is no pneumothorax. There is extrinsic pressure over the left lateral margin of trachea in the lower neck suggesting enlarged thyroid. IMPRESSION: Cardiomegaly. Central pulmonary vessels are prominent suggesting CHF. Faint haziness in the right lower lung fields may suggest asymmetric pulmonary edema. There is no focal pulmonary consolidation. There is no pleural effusion. Electronically Signed   By: Elmer Picker M.D.   On: 05/24/2021 20:10   DG Chest Port 1 View  Result Date: 05/26/2021 CLINICAL DATA:  Shortness of breath EXAM: PORTABLE CHEST 1 VIEW COMPARISON:  05/24/2021 FINDINGS: Cardiac shadow is enlarged but stable. Increased central vascular congestion is again seen and worsened in the interval. Patchy atelectatic changes are noted in the right base. IMPRESSION: Increasing vascular congestion with right basilar atelectasis. Electronically Signed   By: Inez Catalina M.D.   On: 05/26/2021 00:18    EKG: Personally reviewed.  Rhythm is sinus tachycardia with heart rate of 104 bpm.  Evidence of first-degree AV block, no dynamic ST segment changes appreciated.  Assessment/Plan  * Acute respiratory failure with hypercapnia (HCC)- (present on admission) Assessment of patient hampered by concurrent agitation and psychosis Evidence of acute hypercapnic respiratory failure and ABG Etiology is likely acute diastolic congestive heart failure considering coarse breath sounds on exam, complains of shortness of breath and chest x-ray findings consistent with cardiogenic pulmonary edema Acute congestive heart failure likely brought about by the patient having dietary and medication noncompliance with her worsening psychiatric condition While patient was  in restraints earlier in the emergency department stay we have transition the patient successfully off restraints and have initiated BiPAP therapy successfully. Performing serial ABGs while on BiPAP therapy If hypercapnic respiratory failure fails to improve on BiPAP we will consult PCCM for consideration of intubation and ICU transfer Initiating intravenous diuretics Strict input and output monitoring Monitoring renal function and electrolytes with serial chemistires Daily weights     Acute on chronic diastolic CHF (congestive heart failure) ([place])- (present on admission) Please see assessment and plan above  Schizoaffective disorder, bipolar type ([place])- (present on admission) patient's erratic and bizarre behavior is seemingly more consistent with psychosis rather than encephalopathy Per my discussion with the mother, patient has exhibited extremely disorganized and agitated behavior since July when the patient's uncle died Patient has since been noncompliant with her medication regimen and BiPAP therapy (settings 19/15 QHS per outpatient sleep medicine notes) In the emergency department, patient has received 2 doses of Geodon due to bouts of extreme agitation ER provider has additionally placed the patient in involuntary commitment due to concerns over the patient's safety as patient does not seem to possess insight as to their current situation or condition. Patient was temporarily placed in four-point restraints due to extreme agitation but these have since been discontinued. Psychiatry consultation has been ordered, will await their evaluation and appreciate their recommendations.  Patient may very well need to be discharged to the behavioral health floor once her respiratory issues have resolved  Schizoaffective psychosis ([place]) Please see assessment and plan above.  Obesity hypoventilation syndrome ([place])- (present on admission) Longstanding known history of obesity hypoventilation  syndrome with patient having followed with Dr. Camillo Flaming with pulmonology at [facility] Presence of obesity hypoventilation syndrome and frequent use of noninvasive positive pressure ventilation complicates patient's presentation Due to patient's body habitus and obesity hypoventilation syndrome patient is not a very good endotracheal intubation candidate Attempting a trial of BiPAP therapy for now as patient receives diuretics although if patient clinically decompensates we will proceed  with PCCM consultation for consideration of intubation..   OSA (obstructive sleep apnea)- (present on admission) Patient has longstanding history of severe OSA  BiPAP therapy settings 19/15 QHS per outpatient sleep medicine notes in 2021  Essential hypertension- (present on admission) Resume patients home regimen of oral antihypertensives Titrate antihypertensive regimen as necessary to achieve adequate BP control PRN intravenous antihypertensives for excessively elevated blood pressure        Code Status:  Full code  code status  Family Communication: Case discussed with mother via phone conversation who has been updated on plan of care.  Status is: Inpatient  Patient requires inpatient hospitalization for initiation of noninvasive positive pressure ventilation, actively titrating with serial ABGs the patient is being actively diuresed with intravenous diuretics and close clinical monitoring.  CRITICAL CARE ATTESTATION  Patient is at significant risk of morbidity mortality due to acute hypercapnic respiratory failure requiring initiation, titration of noninvasive positive pressure ventilation, interpretation of serial serial ABGs requiring my active interpretation and discussions with family.  Critical care time spent: 62 minutes.       Vernelle Emerald MD [facility] Pager [phone]  If 7PM-7AM, please contact night-coverage [email or web address] Use universal [place] password for that  web site. If you do not have the password, please call the hospital operator.  05/26/2021, 6:36 AM

## 2021-05-25 NOTE — ED Notes (Signed)
Pt placed on nasal cannula, 3L. Oxygen on room air noted at 86%. Oxygen increased to 94% on nasal cannula. ED provider made aware

## 2021-05-25 NOTE — ED Notes (Signed)
Patient currently standing at the nurses station.

## 2021-05-25 NOTE — Progress Notes (Signed)
CSW was contacted at 2:00 PM in regards to patients behaviors in the ED lobby. Patient was seen drinking some type of liquid out of an olay face cream container. Patient has been observed multiple times drinking from the water fountain and bathroom faucet. Patient was observed urinating on herself for the past 2 hours. Patient did provider CSW with her mothers contact information [phone], Verlon Au. Patient also lit up her cigarette multiple times in the ED lobby. Patient has randomly gone up to multiple patients in the ED lobby and has been in their faces asking questions. [employer] was contacted to bring the behavioral unit over for observation of patient. The behavioral unit stated they could not IVC patient because there was not enough information/ behaviors. Patient told the behavioral unit that she wanted to go home. The behavioral unit and [employer] told patient that she could get a bus pass. CSW explained she would not be appropriate for a bus pass, uber, or cab due to patient uncontrollably urinating on herself. The behavioral unit asked the hospital to administer the IVC order. Patient was observed in a black sundress and a white sun hat and white crocs. Patient did go to the bus stop but was not able to get on the bus due to her behaviors. Patient also lost her shoes between that time and came back to the ED lobby with no shoes. Patient again was observed in the bathroom. It's unknown what patient did with her shoes.    CSW also spoke with patients mother who stated that her daughter has schizophrenia and bipolar. Ms. Mayford Knife stated her daughter is off her medication and needs to be committed. Ms. Mayford Knife stated they tried to commit her before.

## 2021-05-25 NOTE — ED Notes (Signed)
Patient refused the CT scan, IV and medication stating "no one is sticking anything in to my body and blood".

## 2021-05-25 NOTE — ED Notes (Signed)
Pt removed the non-rebreather mask from her face. Oxygen 97% on room air. Plan of care explained to pt in response to pt's increased agitation. Warm blankets placed over her after connecting her to full monitor. Sitter remains at pt bedside.

## 2021-05-25 NOTE — ED Notes (Addendum)
Pt brought to purple zone in dress.  Immediately urinated all over the floor. We then managed to get her to room where she urinated again. I had to call security to get her changed and into bed. Pt changed into bariatric gown at this time.  Pt was drinking from sink. Will have facilities turn off water

## 2021-05-25 NOTE — ED Notes (Signed)
Pt is having frequent urination. Gets up every 10-15 min to pee.

## 2021-05-25 NOTE — ED Notes (Signed)
Patient was incontinent of urine. Linens were changed. Patient was place on a purewick. Patient states she just wants to sleep

## 2021-05-25 NOTE — ED Notes (Signed)
Patient walked out stating she was not waiting for the discharge paperwork. Patient was advised to change in to some paper scrubs. Patient states "i'm tired and need a cigarette. Im leaving."

## 2021-05-25 NOTE — ED Notes (Signed)
Pt asleep and breathing through her nose while wearing nasal cannula, at 86%. Per ED provider, non-rebreather mask placed on pt and nasal cannula removed. Oxygen increased to WDL.

## 2021-05-25 NOTE — ED Notes (Signed)
RN noticed pt began snoring loudly, when checking on pt accessory muscle breathing was noted and RN checked O2 saturation which was 48%. RN attempted to wake the pt, pt not waking up, with not diffeernce in O2 sat. RN placed pt on a non-rebreather and had another nurse grab EDP. EDP at bedside, RT  called and pt repositioned. O2 saturation improved to 98%

## 2021-05-25 NOTE — ED Provider Notes (Addendum)
[facility] EMERGENCY DEPARTMENT Provider Note   CSN: [id number] Arrival date & time: 05/25/21  1455     History  Chief Complaint  Patient presents with   Psychiatric Evaluation    Breanna Moore is a 46 y.o. female.  Patient with hx schizoaffective disorder, per report noting taking meds, unusual/bizzare behavior - drinking out of various 'non-drink' containers, lighting cigarettes/other items in ED waiting area, paranoid thoughts, ?hallucinations, and thoughts of self harm. Pt very difficult/poor, and inconsistent historian -  level 5 caveat. Pt does not answer most questions asked. Appears to be responding to internal stimuli.   The history is provided by the patient and medical records. The history is limited by the condition of the patient.      Home Medications Prior to Admission medications   Medication Sig Start Date End Date Taking? Authorizing Provider  albuterol (VENTOLIN HFA) 108 (90 Base) MCG/ACT inhaler Inhale 2 puffs into the lungs every 6 (six) hours as needed for shortness of breath. 10/07/20   [provider]  ARIPiprazole (ABILIFY) 10 MG tablet Take 10 mg by mouth daily. 03/12/21   [provider]  FEROSUL 325 (65 Fe) MG tablet Take 325 mg by mouth daily. 10/07/20   [provider]  FLUoxetine (PROZAC) 20 MG capsule Take 20 mg by mouth daily. 03/12/21   [provider]  furosemide (LASIX) 40 MG tablet Take 1 tablet (40 mg total) by mouth daily. 03/15/21   Isla Pence, MD  hydrochlorothiazide (HYDRODIURIL) 25 MG tablet Take 25 mg by mouth daily. 10/13/20   [provider]  hydrOXYzine (VISTARIL) 25 MG capsule Take 25 mg by mouth every 6 (six) hours as needed for anxiety. 10/07/20   [provider]  lisinopril (ZESTRIL) 10 MG tablet Take 10 mg by mouth daily. 12/14/20   [provider]      Allergies    Hydrocodone-acetaminophen, Ketorolac, Paroxetine hcl, and Penicillins    Review  of Systems   Review of Systems  Unable to perform ROS: Psychiatric disorder  Level 5 caveat - pt not participative w ros.    Physical Exam Updated Vital Signs BP (!) 167/108 (BP Location: Left Wrist)    Pulse 100    Temp 98.4 F (36.9 C) (Oral)    Resp 20    LMP  (LMP Unknown)    SpO2 99%  Physical Exam Vitals and nursing note reviewed.  Constitutional:      Appearance: Normal appearance. She is well-developed.  HENT:     Head: Atraumatic.     Nose: Nose normal.     Mouth/Throat:     Mouth: Mucous membranes are moist.  Eyes:     General: No scleral icterus.    Conjunctiva/sclera: Conjunctivae normal.     Pupils: Pupils are equal, round, and reactive to light.  Neck:     Vascular: No carotid bruit.     Trachea: No tracheal deviation.     Comments: Trachea midline. Thyroid not grossly enlarged or tender. No bruits. No stiffness or rigidity.  Cardiovascular:     Rate and Rhythm: Normal rate and regular rhythm.     Pulses: Normal pulses.     Heart sounds: Normal heart sounds. No murmur heard.   No friction rub. No gallop.  Pulmonary:     Effort: Pulmonary effort is normal. No respiratory distress.     Breath sounds: Normal breath sounds.  Abdominal:     General: Bowel sounds are normal. There is no  distension.     Palpations: Abdomen is soft.     Tenderness: There is no abdominal tenderness. There is no guarding.     Comments: Obese.   Genitourinary:    Comments: No cva tenderness.  Musculoskeletal:        General: No swelling or tenderness.     Cervical back: Normal range of motion and neck supple. No rigidity. No muscular tenderness.  Skin:    General: Skin is warm and dry.     Findings: No rash.  Neurological:     Mental Status: She is alert.     Comments: Alert, speech of normal quality, no gross dysarthria or aphasia noted. Motor/sens grossly intact bil. Ambulates w steady gait.   Psychiatric:     Comments: Pt appears to be responding to internal stimuli,  ?hallucinations/delusions - pt largely non-participative w eval.     ED Results / Procedures / Treatments   Labs (all labs ordered are listed, but only abnormal results are displayed) Results for orders placed or performed during the hospital encounter of 05/25/21  Comprehensive metabolic panel  Result Value Ref Range   Sodium 135 135 - 145 mmol/L   Potassium 3.6 3.5 - 5.1 mmol/L   Chloride 101 98 - 111 mmol/L   CO2 26 22 - 32 mmol/L   Glucose, Bld 93 70 - 99 mg/dL   BUN 7 6 - 20 mg/dL   Creatinine, Ser 0.75 0.44 - 1.00 mg/dL   Calcium 9.3 8.9 - 10.3 mg/dL   Total Protein 8.7 (H) 6.5 - 8.1 g/dL   Albumin 3.4 (L) 3.5 - 5.0 g/dL   AST 14 (L) 15 - 41 U/L   ALT 14 0 - 44 U/L   Alkaline Phosphatase 76 38 - 126 U/L   Total Bilirubin 0.6 0.3 - 1.2 mg/dL   GFR, Estimated >60 >60 mL/min   Anion gap 8 5 - 15  Ethanol  Result Value Ref Range   Alcohol, Ethyl (B) Q000111Q Q000111Q mg/dL  Salicylate level  Result Value Ref Range   Salicylate Lvl Q000111Q (L) 7.0 - 30.0 mg/dL  Acetaminophen level  Result Value Ref Range   Acetaminophen (Tylenol), Serum <10 (L) 10 - 30 ug/mL  cbc  Result Value Ref Range   WBC 8.5 4.0 - 10.5 K/uL   RBC 6.29 (H) 3.87 - 5.11 MIL/uL   Hemoglobin 12.1 12.0 - 15.0 g/dL   HCT 42.2 36.0 - 46.0 %   MCV 67.1 (L) 80.0 - 100.0 fL   MCH 19.2 (L) 26.0 - 34.0 pg   MCHC 28.7 (L) 30.0 - 36.0 g/dL   RDW 23.4 (H) 11.5 - 15.5 %   Platelets 363 150 - 400 K/uL   nRBC 0.0 0.0 - 0.2 %  I-Stat beta hCG blood, ED  Result Value Ref Range   I-stat hCG, quantitative <5.0 <5 mIU/mL   Comment 3           DG Chest 2 View  Result Date: 05/24/2021 CLINICAL DATA:  Chest pain EXAM: CHEST - 2 VIEW COMPARISON:  03/15/2021 FINDINGS: Transverse diameter of heart is increased. Central pulmonary vessels are prominent. There is interval improvement in aeration of right lower lung fields. There is faint haziness in the right lower lung fields. Costophrenic angles are clear. There is no pneumothorax.  There is extrinsic pressure over the left lateral margin of trachea in the lower neck suggesting enlarged thyroid. IMPRESSION: Cardiomegaly. Central pulmonary vessels are prominent suggesting CHF. Faint haziness in the right  lower lung fields may suggest asymmetric pulmonary edema. There is no focal pulmonary consolidation. There is no pleural effusion. Electronically Signed   By: Elmer Picker M.D.   On: 05/24/2021 20:10   CT Head Wo Contrast  Result Date: 05/21/2021 CLINICAL DATA:  Head injury.  Headache.  Left arm weakness. EXAM: CT HEAD WITHOUT CONTRAST TECHNIQUE: Contiguous axial images were obtained from the base of the skull through the vertex without intravenous contrast. RADIATION DOSE REDUCTION: This exam was performed according to the departmental dose-optimization program which includes automated exposure control, adjustment of the mA and/or kV according to patient size and/or use of iterative reconstruction technique. COMPARISON:  Head CT 01/20/2021 and MRI 11/22/2019 FINDINGS: Brain: There is no evidence of an acute infarct, intracranial hemorrhage, mass, midline shift, or extra-axial fluid collection. The ventricles and sulci are normal. An enlarged, partially empty sella is unchanged. Hypodensities in the cerebral white matter bilaterally are similar to the prior CT and are nonspecific but may reflect premature chronic small vessel ischemia given vascular risk factors. Vascular: No hyperdense vessel. Skull: No fracture or suspicious osseous lesion. Sinuses/Orbits: Visualized paranasal sinuses and mastoid air cells are clear. Unremarkable orbits. Other: None. IMPRESSION: 1. No evidence of acute intracranial abnormality. 2. Chronic white matter disease and partially empty sella. Electronically Signed   By: Logan Bores M.D.   On: 05/21/2021 14:10    EKG EKG Interpretation  Date/Time:  Friday May 25 2021 16:46:08 EST Ventricular Rate:  104 PR Interval:  216 QRS Duration: 64 QT  Interval:  342 QTC Calculation: 449 R Axis:   96 Text Interpretation: Sinus tachycardia with 1st degree A-V block Low voltage QRS Baseline wander Confirmed by Lajean Saver [phone]) on 05/25/2021 4:58:52 PM  Radiology DG Chest 2 View  Result Date: 05/24/2021 CLINICAL DATA:  Chest pain EXAM: CHEST - 2 VIEW COMPARISON:  03/15/2021 FINDINGS: Transverse diameter of heart is increased. Central pulmonary vessels are prominent. There is interval improvement in aeration of right lower lung fields. There is faint haziness in the right lower lung fields. Costophrenic angles are clear. There is no pneumothorax. There is extrinsic pressure over the left lateral margin of trachea in the lower neck suggesting enlarged thyroid. IMPRESSION: Cardiomegaly. Central pulmonary vessels are prominent suggesting CHF. Faint haziness in the right lower lung fields may suggest asymmetric pulmonary edema. There is no focal pulmonary consolidation. There is no pleural effusion. Electronically Signed   By: Elmer Picker M.D.   On: 05/24/2021 20:10    Procedures Procedures    Medications Ordered in ED Medications  ziprasidone (GEODON) injection 10 mg (has no administration in time range)    ED Course/ Medical Decision Making/ A&P                           Medical Decision Making Problems Addressed: Acute psychosis ([place]): acute illness or injury with systemic symptoms that poses a threat to life or bodily functions Non compliance w medication regimen: acute illness or injury with systemic symptoms Schizoaffective disorder, bipolar type ([place]): chronic illness or injury with exacerbation, progression, or side effects of treatment that poses a threat to life or bodily functions  Amount and/or Complexity of Data Reviewed Independent Historian: parent and EMS    Details: additional hx by EMS. TOC consulted -discussed pt, addtional hx provided by [facility] and parent. External Data Reviewed: labs, radiology, ECG and  notes. Labs: ordered. Decision-making details documented in ED Course. Radiology: ordered and independent  interpretation performed. Decision-making details documented in ED Course. ECG/medicine tests: ordered and independent interpretation performed. Decision-making details documented in ED Course. Discussion of management or test interpretation with external provider(s): TOC team and [facility] team consulted - pt, condition and tx plan discussed.   Risk Prescription drug management. Drug therapy requiring intensive monitoring for toxicity. Decision regarding hospitalization. Diagnosis or treatment significantly limited by social determinants of health.   Iv ns. Stat labs. Imaging studies reviewed. Continuous pulse ox and cardiac monitor.   Cardiac monitor - sinus rhythm, rate 98.   Reviewed nursing notes and prior charts for additional history. Prior external reports/NH notes reviewed.   [facility] team consulted for psych evaluation.  IVC and first exam papers filled out.   Labs reviewed/interpreted by me - chem normal, glucose normal, wbc normal.  Recent CT reviewed/interpreted by me - no hem  Recent cxr reviewed/interpreted by me - no pna.   Geodon 10 mg im for symptom improvement/psychosis.   Recheck pt - calm, alert, no distress.   Pharmacy tech consulted to perform med rec.   The patient has been placed in psychiatric observation due to the need to provide a safe environment for the patient while obtaining psychiatric consultation and evaluation, as well as ongoing medical and medication management to treat the patient's condition.  The patient has been placed under full IVC at this time.  Disposition per [facility] team.           Final Clinical Impression(s) / ED Diagnoses Final diagnoses:  Acute psychosis ([place])  Schizoaffective disorder, bipolar type ([place])  Non compliance w medication regimen    Rx / DC Orders ED Discharge Orders     None           Lajean Saver,  MD 05/25/21 1700

## 2021-05-25 NOTE — ED Notes (Signed)
Pt has gotten up twice more and peed in floor and drinks from sink.  Pt is very difficult to redirect.

## 2021-05-26 ENCOUNTER — Observation Stay (HOSPITAL_COMMUNITY): Payer: 59

## 2021-05-26 DIAGNOSIS — Z6841 Body Mass Index (BMI) 40.0 and over, adult: Secondary | ICD-10-CM | POA: Diagnosis not present

## 2021-05-26 DIAGNOSIS — J9690 Respiratory failure, unspecified, unspecified whether with hypoxia or hypercapnia: Secondary | ICD-10-CM | POA: Diagnosis present

## 2021-05-26 DIAGNOSIS — R109 Unspecified abdominal pain: Secondary | ICD-10-CM | POA: Diagnosis present

## 2021-05-26 DIAGNOSIS — Z91199 Patient's noncompliance with other medical treatment and regimen due to unspecified reason: Secondary | ICD-10-CM

## 2021-05-26 DIAGNOSIS — F32A Depression, unspecified: Secondary | ICD-10-CM | POA: Diagnosis present

## 2021-05-26 DIAGNOSIS — Z888 Allergy status to other drugs, medicaments and biological substances status: Secondary | ICD-10-CM | POA: Diagnosis not present

## 2021-05-26 DIAGNOSIS — F29 Unspecified psychosis not due to a substance or known physiological condition: Secondary | ICD-10-CM | POA: Diagnosis not present

## 2021-05-26 DIAGNOSIS — F609 Personality disorder, unspecified: Secondary | ICD-10-CM | POA: Diagnosis present

## 2021-05-26 DIAGNOSIS — J9811 Atelectasis: Secondary | ICD-10-CM | POA: Diagnosis present

## 2021-05-26 DIAGNOSIS — Z9114 Patient's other noncompliance with medication regimen: Secondary | ICD-10-CM | POA: Diagnosis not present

## 2021-05-26 DIAGNOSIS — Z885 Allergy status to narcotic agent status: Secondary | ICD-10-CM | POA: Diagnosis not present

## 2021-05-26 DIAGNOSIS — Z79899 Other long term (current) drug therapy: Secondary | ICD-10-CM | POA: Diagnosis not present

## 2021-05-26 DIAGNOSIS — Z818 Family history of other mental and behavioral disorders: Secondary | ICD-10-CM | POA: Diagnosis not present

## 2021-05-26 DIAGNOSIS — R4182 Altered mental status, unspecified: Secondary | ICD-10-CM | POA: Diagnosis not present

## 2021-05-26 DIAGNOSIS — F410 Panic disorder [episodic paroxysmal anxiety] without agoraphobia: Secondary | ICD-10-CM | POA: Diagnosis present

## 2021-05-26 DIAGNOSIS — F23 Brief psychotic disorder: Secondary | ICD-10-CM

## 2021-05-26 DIAGNOSIS — G929 Unspecified toxic encephalopathy: Secondary | ICD-10-CM | POA: Diagnosis present

## 2021-05-26 DIAGNOSIS — F250 Schizoaffective disorder, bipolar type: Secondary | ICD-10-CM | POA: Diagnosis present

## 2021-05-26 DIAGNOSIS — I7121 Aneurysm of the ascending aorta, without rupture: Secondary | ICD-10-CM | POA: Diagnosis present

## 2021-05-26 DIAGNOSIS — I110 Hypertensive heart disease with heart failure: Secondary | ICD-10-CM | POA: Diagnosis present

## 2021-05-26 DIAGNOSIS — Z880 Allergy status to penicillin: Secondary | ICD-10-CM | POA: Diagnosis not present

## 2021-05-26 DIAGNOSIS — J9602 Acute respiratory failure with hypercapnia: Secondary | ICD-10-CM | POA: Diagnosis present

## 2021-05-26 DIAGNOSIS — E662 Morbid (severe) obesity with alveolar hypoventilation: Secondary | ICD-10-CM | POA: Diagnosis present

## 2021-05-26 DIAGNOSIS — Z20822 Contact with and (suspected) exposure to covid-19: Secondary | ICD-10-CM | POA: Diagnosis present

## 2021-05-26 DIAGNOSIS — Z91119 Patient's noncompliance with dietary regimen due to unspecified reason: Secondary | ICD-10-CM | POA: Diagnosis not present

## 2021-05-26 DIAGNOSIS — I5033 Acute on chronic diastolic (congestive) heart failure: Secondary | ICD-10-CM | POA: Diagnosis present

## 2021-05-26 DIAGNOSIS — F259 Schizoaffective disorder, unspecified: Secondary | ICD-10-CM | POA: Insufficient documentation

## 2021-05-26 DIAGNOSIS — F17210 Nicotine dependence, cigarettes, uncomplicated: Secondary | ICD-10-CM | POA: Diagnosis present

## 2021-05-26 DIAGNOSIS — Z781 Physical restraint status: Secondary | ICD-10-CM | POA: Diagnosis not present

## 2021-05-26 LAB — COMPREHENSIVE METABOLIC PANEL
ALT: 15 U/L (ref 0–44)
AST: 21 U/L (ref 15–41)
Albumin: 2.8 g/dL — ABNORMAL LOW (ref 3.5–5.0)
Alkaline Phosphatase: 68 U/L (ref 38–126)
Anion gap: 9 (ref 5–15)
BUN: 6 mg/dL (ref 6–20)
CO2: 27 mmol/L (ref 22–32)
Calcium: 8.9 mg/dL (ref 8.9–10.3)
Chloride: 104 mmol/L (ref 98–111)
Creatinine, Ser: 0.73 mg/dL (ref 0.44–1.00)
GFR, Estimated: >60 mL/min (ref >60)
Glucose, Bld: 77 mg/dL (ref 70–99)
Potassium: 4.6 mmol/L (ref 3.5–5.1)
Sodium: 140 mmol/L (ref 135–145)
Total Bilirubin: 1 mg/dL (ref 0.3–1.2)
Total Protein: 7.2 g/dL (ref 6.5–8.1)

## 2021-05-26 LAB — I-STAT ARTERIAL BLOOD GAS, ED
Acid-Base Excess: 1 mmol/L (ref 0.0–2.0)
Bicarbonate: 31.5 mmol/L — ABNORMAL HIGH (ref 20.0–28.0)
Calcium, Ion: 1.29 mmol/L (ref 1.15–1.40)
HCT: 39 % (ref 36.0–46.0)
Hemoglobin: 13.3 g/dL (ref 12.0–15.0)
O2 Saturation: 93 %
Patient temperature: 98.6
Potassium: 4.2 mmol/L (ref 3.5–5.1)
Sodium: 141 mmol/L (ref 135–145)
TCO2: 34 mmol/L — ABNORMAL HIGH (ref 22–32)
pCO2 arterial: 78.5 mmHg (ref 32.0–48.0)
pH, Arterial: 7.212 — ABNORMAL LOW (ref 7.350–7.450)
pO2, Arterial: 83 mmHg (ref 83.0–108.0)

## 2021-05-26 LAB — BLOOD GAS, ARTERIAL
Acid-Base Excess: 2.9 mmol/L — ABNORMAL HIGH (ref 0.0–2.0)
Allens test (pass/fail): POSITIVE — AB
Bicarbonate: 28.2 mmol/L — ABNORMAL HIGH (ref 20.0–28.0)
Drawn by: 36496
FIO2: 30
O2 Saturation: 92.6 %
Patient temperature: 36.7
pCO2 arterial: 54.1 mmHg — ABNORMAL HIGH (ref 32.0–48.0)
pH, Arterial: 7.336 — ABNORMAL LOW (ref 7.350–7.450)
pO2, Arterial: 68.4 mmHg — ABNORMAL LOW (ref 83.0–108.0)

## 2021-05-26 LAB — CBC WITH DIFFERENTIAL/PLATELET
Abs Immature Granulocytes: 0.02 10*3/uL (ref 0.00–0.07)
Basophils Absolute: 0 10*3/uL (ref 0.0–0.1)
Basophils Relative: 0 %
Eosinophils Absolute: 0 10*3/uL (ref 0.0–0.5)
Eosinophils Relative: 0 %
HCT: 39.3 % (ref 36.0–46.0)
Hemoglobin: 10.8 g/dL — ABNORMAL LOW (ref 12.0–15.0)
Immature Granulocytes: 0 %
Lymphocytes Relative: 14 %
Lymphs Abs: 1.1 10*3/uL (ref 0.7–4.0)
MCH: 18.9 pg — ABNORMAL LOW (ref 26.0–34.0)
MCHC: 27.5 g/dL — ABNORMAL LOW (ref 30.0–36.0)
MCV: 68.9 fL — ABNORMAL LOW (ref 80.0–100.0)
Monocytes Absolute: 0.6 10*3/uL (ref 0.1–1.0)
Monocytes Relative: 7 %
Neutro Abs: 6.2 10*3/uL (ref 1.7–7.7)
Neutrophils Relative %: 79 %
Platelets: 266 10*3/uL (ref 150–400)
RBC: 5.7 MIL/uL — ABNORMAL HIGH (ref 3.87–5.11)
RDW: 23.7 % — ABNORMAL HIGH (ref 11.5–15.5)
WBC: 8 10*3/uL (ref 4.0–10.5)
nRBC: 0 % (ref 0.0–0.2)

## 2021-05-26 LAB — MAGNESIUM: Magnesium: 1.9 mg/dL (ref 1.7–2.4)

## 2021-05-26 LAB — HIV ANTIBODY (ROUTINE TESTING W REFLEX): HIV Screen 4th Generation wRfx: NONREACTIVE

## 2021-05-26 LAB — C-REACTIVE PROTEIN: CRP: 6.1 mg/dL — ABNORMAL HIGH (ref <1.0)

## 2021-05-26 LAB — BRAIN NATRIURETIC PEPTIDE: B Natriuretic Peptide: 52.7 pg/mL (ref 0.0–100.0)

## 2021-05-26 LAB — PROCALCITONIN: Procalcitonin: <0.1 ng/mL

## 2021-05-26 MED ORDER — ENOXAPARIN SODIUM 40 MG/0.4ML IJ SOSY
40.0000 mg | PREFILLED_SYRINGE | Freq: Every day | INTRAMUSCULAR | Status: DC
  Administered 2021-05-27 – 2021-05-30 (×3): 40 mg via SUBCUTANEOUS
  Filled 2021-05-26 (×6): qty 0.4

## 2021-05-26 MED ORDER — FUROSEMIDE 10 MG/ML IJ SOLN: 40.0000 mg | Freq: Two times a day (BID) | INTRAMUSCULAR | Status: DC

## 2021-05-26 MED ORDER — DIPHENHYDRAMINE HCL 50 MG/ML IJ SOLN
INTRAMUSCULAR | Status: AC
  Filled 2021-05-26: qty 1

## 2021-05-26 MED ORDER — ORAL CARE MOUTH RINSE
15.0000 mL | Freq: Two times a day (BID) | OROMUCOSAL | Status: DC
  Administered 2021-05-29 – 2021-05-31 (×3): 15 mL via OROMUCOSAL

## 2021-05-26 MED ORDER — ALUM & MAG HYDROXIDE-SIMETH 200-200-20 MG/5ML PO SUSP
30.0000 mL | Freq: Three times a day (TID) | ORAL | Status: DC | PRN
  Administered 2021-05-26 – 2021-05-29 (×5): 30 mL via ORAL
  Filled 2021-05-26 (×6): qty 30

## 2021-05-26 MED ORDER — ARIPIPRAZOLE 5 MG PO TABS
10.0000 mg | ORAL_TABLET | Freq: Every day | ORAL | Status: DC
  Administered 2021-05-26 – 2021-05-31 (×4): 10 mg via ORAL
  Filled 2021-05-26 (×4): qty 2

## 2021-05-26 MED ORDER — LISINOPRIL 10 MG PO TABS
10.0000 mg | ORAL_TABLET | Freq: Every day | ORAL | Status: DC
  Administered 2021-05-26 – 2021-05-31 (×4): 10 mg via ORAL
  Filled 2021-05-26 (×5): qty 1

## 2021-05-26 MED ORDER — FUROSEMIDE 10 MG/ML IJ SOLN
60.0000 mg | Freq: Once | INTRAMUSCULAR | Status: DC
  Filled 2021-05-26: qty 6

## 2021-05-26 MED ORDER — FLUOXETINE HCL 20 MG PO CAPS
20.0000 mg | ORAL_CAPSULE | Freq: Every day | ORAL | Status: DC
  Administered 2021-05-26: 20 mg via ORAL
  Filled 2021-05-26: qty 1

## 2021-05-26 MED ORDER — HALOPERIDOL LACTATE 5 MG/ML IJ SOLN
10.0000 mg | Freq: Four times a day (QID) | INTRAMUSCULAR | Status: DC | PRN
  Administered 2021-05-26: 10 mg via INTRAMUSCULAR
  Filled 2021-05-26 (×2): qty 2

## 2021-05-26 MED ORDER — FUROSEMIDE 10 MG/ML IJ SOLN
40.0000 mg | Freq: Once | INTRAMUSCULAR | Status: AC
  Administered 2021-05-26: 40 mg via INTRAVENOUS
  Filled 2021-05-26: qty 4

## 2021-05-26 MED ORDER — DIAZEPAM 5 MG/ML IJ SOLN
5.0000 mg | Freq: Once | INTRAMUSCULAR | Status: AC
  Administered 2021-05-27: 5 mg via INTRAMUSCULAR
  Filled 2021-05-26: qty 2

## 2021-05-26 MED ORDER — ACETAMINOPHEN 650 MG RE SUPP: 650.0000 mg | Freq: Four times a day (QID) | RECTAL | Status: DC | PRN

## 2021-05-26 MED ORDER — CHLORHEXIDINE GLUCONATE 0.12 % MT SOLN
15.0000 mL | Freq: Two times a day (BID) | OROMUCOSAL | Status: DC
  Administered 2021-05-29 – 2021-05-31 (×3): 15 mL via OROMUCOSAL
  Filled 2021-05-26 (×6): qty 15

## 2021-05-26 MED ORDER — MELATONIN 5 MG PO TABS
5.0000 mg | ORAL_TABLET | Freq: Every evening | ORAL | Status: DC | PRN
  Administered 2021-05-27: 5 mg via ORAL
  Filled 2021-05-26 (×3): qty 1

## 2021-05-26 MED ORDER — HYDRALAZINE HCL 20 MG/ML IJ SOLN: 10.0000 mg | Freq: Four times a day (QID) | INTRAMUSCULAR | Status: DC | PRN

## 2021-05-26 MED ORDER — IPRATROPIUM-ALBUTEROL 0.5-2.5 (3) MG/3ML IN SOLN: 3.0000 mL | RESPIRATORY_TRACT | Status: DC | PRN

## 2021-05-26 MED ORDER — ONDANSETRON HCL 4 MG PO TABS
4.0000 mg | ORAL_TABLET | Freq: Four times a day (QID) | ORAL | Status: DC | PRN
  Administered 2021-05-26: 4 mg via ORAL
  Filled 2021-05-26: qty 1

## 2021-05-26 MED ORDER — ACETAMINOPHEN 325 MG PO TABS
650.0000 mg | ORAL_TABLET | Freq: Four times a day (QID) | ORAL | Status: DC | PRN
  Filled 2021-05-26: qty 2

## 2021-05-26 MED ORDER — DIPHENHYDRAMINE HCL 50 MG/ML IJ SOLN
50.0000 mg | Freq: Once | INTRAMUSCULAR | Status: AC
Start: 2021-05-26 — End: 2021-05-26
  Administered 2021-05-26: 50 mg via INTRAMUSCULAR

## 2021-05-26 MED ORDER — IPRATROPIUM-ALBUTEROL 0.5-2.5 (3) MG/3ML IN SOLN
3.0000 mL | Freq: Four times a day (QID) | RESPIRATORY_TRACT | Status: DC
  Administered 2021-05-26: 3 mL via RESPIRATORY_TRACT
  Filled 2021-05-26: qty 3

## 2021-05-26 MED ORDER — FUROSEMIDE 40 MG PO TABS
40.0000 mg | ORAL_TABLET | Freq: Every day | ORAL | Status: DC
  Administered 2021-05-26: 40 mg via ORAL
  Filled 2021-05-26 (×2): qty 1

## 2021-05-26 MED ORDER — ASENAPINE MALEATE 5 MG SL SUBL
10.0000 mg | SUBLINGUAL_TABLET | Freq: Two times a day (BID) | SUBLINGUAL | Status: DC | PRN
  Filled 2021-05-26: qty 2

## 2021-05-26 MED ORDER — HYDROXYZINE HCL 25 MG PO TABS
25.0000 mg | ORAL_TABLET | Freq: Four times a day (QID) | ORAL | Status: DC | PRN
  Administered 2021-05-28 – 2021-05-31 (×2): 25 mg via ORAL
  Filled 2021-05-26 (×3): qty 1

## 2021-05-26 MED ORDER — ONDANSETRON HCL 4 MG/2ML IJ SOLN
4.0000 mg | Freq: Four times a day (QID) | INTRAMUSCULAR | Status: DC | PRN
  Filled 2021-05-26: qty 2

## 2021-05-26 MED ORDER — POLYETHYLENE GLYCOL 3350 17 G PO PACK: 17.0000 g | PACK | Freq: Every day | ORAL | Status: DC | PRN

## 2021-05-26 NOTE — Assessment & Plan Note (Signed)
.   Resume patients home regimen of oral antihypertensives . Titrate antihypertensive regimen as necessary to achieve adequate BP control . PRN intravenous antihypertensives for excessively elevated blood pressure   

## 2021-05-26 NOTE — Assessment & Plan Note (Signed)
·   Please see assessment and plan above °

## 2021-05-26 NOTE — Progress Notes (Signed)
Pt refusing to keep telemetry box on. MD Thedore Mins notified.

## 2021-05-26 NOTE — Progress Notes (Signed)
Patient having CP but would not provide any descriptors about the pain or intensity. Pt refused EKG, nitro, and any pain medicine. Pt requesting medication for indigestion. MD Thedore Mins notified, Maalox ordered.
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
## 2021-05-26 NOTE — Assessment & Plan Note (Addendum)
Per patient's mother at the time of admission patient has been exhibiting highly erratic behavior since July, she has been noncompliant with her psych medications and other medications.  She is exhibiting the same behavior in the hospital psych frequently refused medications, seen by psych placed on Depakote, ARIPiprazole somewhat improved, she will definitely benefit from [facility] placement.

## 2021-05-26 NOTE — Progress Notes (Signed)
Pt transported on BIPAP from ED to 6e20 without incidence.

## 2021-05-26 NOTE — Progress Notes (Signed)
Patient fairly belligerent.  Doesn't answer questions appropriately..  Unable to finish the admission assessment at the current time.

## 2021-05-26 NOTE — Progress Notes (Signed)
PROGRESS NOTE                                                                                                                                                                                                             Patient Demographics:    Breanna Moore, is a 46 y.o. female, DOB - [birth year], CG:[id number]  Outpatient Primary MD for the patient is Andria Frames, PA-C    LOS - 0  Admit date - 05/25/2021    Chief Complaint  Patient presents with   Psychiatric Evaluation       Brief Narrative (HPI from H&P)   46 year old female with past medical history of schizoaffective disorder, hypertension, morbid obesity, obesity hypoventilation syndrome (has followed with Dr. Camillo Flaming with [facility] in the past), restrictive lung disease, severe obstructive sleep apnea (PSG 10/2019 AHI 63) who presented to [facility] emergency department via EMS with complaints of chest and abdominal pain, apparently she has stopped taking her psych medications at home for a while and has not been using her BiPAP as she has lost the machine, in the ER she exhibited highly erratic behavior, she took her clothes off and was pacing in the ER, she was aggressive to the staff, she subsequently received Geodon and then developed hypercapnic respiratory failure.   Subjective:    Breanna Moore today has, No headache, No chest pain, No abdominal pain - No Nausea, No new weakness tingling or numbness, improved SOB.   Assessment  & Plan :    * Acute respiratory failure with hypercapnia (HCC)- (present on admission) Due to combination of acute on chronic diastolic CHF, receiving Geodon in the ER, underlying OSA and OHS and noncompliance with BiPAP with severe toxic encephalopathy.  She has responded well to Lasix with diuresis, overnight BiPAP treatment, ABGs have improved and her mentation has improved considerably, she is still  noncompliant with medications and as soon as she is feeling better she has started to take off her leads and IV.   Continue supportive care, for now nighttime BiPAP if she complies, diurese as available and monitor.      Schizoaffective disorder, bipolar type ([place])- (present on admission) Per patient's mother at the time of admission patient has been exhibiting highly erratic behavior since July, she has been noncompliant with her psych medications and other medications.  She is exhibiting the same behavior in the hospital psych has been consulted will defer further management of this issue to psych  Obesity hypoventilation syndrome ([place])- (present on admission) Longstanding known history of obesity hypoventilation syndrome with patient having followed with Dr. Camillo Flaming with pulmonology at [facility], BiPAP nightly, weight loss with PCP thereafter outpatient follow-up with her pulmonologist postdischarge   OSA (obstructive sleep apnea)- (present on admission) See #1 above  Essential hypertension- (present on admission) Resume patients home regimen of oral antihypertensives Titrate antihypertensive regimen as necessary to achieve adequate BP control PRN intravenous antihypertensives for excessively elevated blood pressure    Acute on chronic diastolic CHF (congestive heart failure) ([place])- (present on admission) Please see assessment and plan above  BMI 60.0-69.9, adult ([place]) With underlying OSA, OHS and noncompliance with BiPAP.  Counseled on compliance.  Follow with PCP on weight loss            Condition - Extremely Guarded  Family Communication  :  called Mother miss Breanna Moore [phone] on 05/26/2021 at 11:49 AM and message left  Code Status :  Full  Consults  :  Psych  PUD Prophylaxis :    Procedures  :            Disposition Plan  :    Status is: Inpatient   DVT Prophylaxis  :    enoxaparin (LOVENOX) injection 40 mg Start: 05/26/21 1000    Lab Results   Component Value Date   PLT 266 05/26/2021    Diet :  Diet Order             DIET DYS 3 Room service appropriate? No; Fluid consistency: Thin  Diet effective now                    Inpatient Medications  Scheduled Meds:  ARIPiprazole  10 mg Oral Daily   chlorhexidine  15 mL Mouth Rinse BID   enoxaparin (LOVENOX) injection  40 mg Subcutaneous Daily   FLUoxetine  20 mg Oral Daily   furosemide  60 mg Intravenous Once   ipratropium-albuterol  3 mL Nebulization Q6H   lisinopril  10 mg Oral Daily   mouth rinse  15 mL Mouth Rinse q12n4p   Continuous Infusions: PRN Meds:.acetaminophen **OR** acetaminophen, hydrALAZINE, hydrOXYzine, ipratropium-albuterol, ondansetron **OR** ondansetron (ZOFRAN) IV, polyethylene glycol  Antibiotics  :    Anti-infectives (From admission, onward)    None        Time Spent in minutes  30   Lala Lund M.D on 05/26/2021 at 11:58 AM  To page go to [email or web address]   [facility] -  Office  [phone]  See all Orders from today for further details    Objective:   Vitals:   05/26/21 0425 05/26/21 0435 05/26/21 0740 05/26/21 0939  BP: 121/69 121/69  (!) 151/89  Pulse: 94 97 89   Resp:  (!) 23 (!) 22   Temp:  98.4 F (36.9 C)    TempSrc:  Axillary    SpO2: 97% 99% 97% 94%  Height:        Wt Readings from Last 3 Encounters:  04/19/21 124.7 kg  03/15/21 (!) 147.4 kg  01/22/21 (!) 174.2 kg     Intake/Output Summary (Last 24 hours) at 05/26/2021 1158 Last data filed at 05/26/2021 0900 Gross per 24 hour  Intake 0 ml  Output 700 ml  Net -700 ml     Physical Exam  Awake Alert, No new F.N deficits,  Normal affect [place].AT,PERRAL Supple Neck, No JVD,   Symmetrical Chest wall movement, Good air movement bilaterally, CTAB RRR,No Gallops,Rubs or new Murmurs,  +ve B.Sounds, Abd Soft, No tenderness,   No Cyanosis, Clubbing or edema       Data Review:    CBC Recent Labs  Lab 05/21/21 1408 05/24/21 1942  05/25/21 1136 05/25/21 2211 05/25/21 2240 05/26/21 0252 05/26/21 0443  WBC 8.0 9.3 8.5  --  9.8  --  8.0  HGB 11.4* 11.4* 12.1 13.3 10.9* 13.3 10.8*  HCT 41.6 40.9 42.2 39.0 40.0 39.0 39.3  PLT 388 388 363  --  360  --  266  MCV 69.3* 68.7* 67.1*  --  68.4*  --  68.9*  MCH 19.0* 19.2* 19.2*  --  18.6*  --  18.9*  MCHC 27.4* 27.9* 28.7*  --  27.3*  --  27.5*  RDW 24.0* 24.0* 23.4*  --  23.4*  --  23.7*  LYMPHSABS 2.3  --   --   --  2.1  --  1.1  MONOABS 0.5  --   --   --  0.6  --  0.6  EOSABS 0.1  --   --   --  0.0  --  0.0  BASOSABS 0.0  --   --   --  0.0  --  0.0    Electrolytes Recent Labs  Lab 05/21/21 1408 05/24/21 1942 05/25/21 1136 05/25/21 2211 05/25/21 2240 05/26/21 0252 05/26/21 0443  NA 136 132* 135 139 137 141 140  K 3.9 3.6 3.6 3.5 3.7 4.2 4.6  CL 103 98 101  --  102  --  104  CO2 30 26 26   --  24  --  27  GLUCOSE 96 82 93  --  89  --  77  BUN 13 9 7   --  6  --  6  CREATININE 0.70 0.71 0.75  --  0.72  --  0.73  CALCIUM 8.9 9.0 9.3  --  9.1  --  8.9  AST 12* 17 14*  --  17  --  21  ALT 14 14 14   --  14  --  15  ALKPHOS 73 71 76  --  71  --  68  BILITOT 0.6 0.7 0.6  --  0.7  --  1.0  ALBUMIN 3.1* 3.4* 3.4*  --  3.1*  --  2.8*  MG  --   --   --   --   --   --  1.9  CRP  --   --   --   --   --   --  6.1*  PROCALCITON  --   --   --   --   --   --  <0.10  BNP  --   --   --   --   --   --  52.7    ------------------------------------------------------------------------------------------------------------------ No results for input(s): CHOL, HDL, LDLCALC, TRIG, CHOLHDL, LDLDIRECT in the last 72 hours.  No results found for: HGBA1C  No results for input(s): TSH, T4TOTAL, T3FREE, THYROIDAB in the last 72 hours.  Invalid input(s): FREET3 ------------------------------------------------------------------------------------------------------------------ ID Labs Recent Labs  Lab 05/21/21 1408 05/24/21 1942 05/25/21 1136 05/25/21 2240 05/26/21 0443  WBC  8.0 9.3 8.5 9.8 8.0  PLT 388 388 363 360 266  CRP  --   --   --   --  6.1*  PROCALCITON  --   --   --   --  <  0.10  CREATININE 0.70 0.71 0.75 0.72 0.73   Cardiac Enzymes No results for input(s): CKMB, TROPONINI, MYOGLOBIN in the last 168 hours.  Invalid input(s): CK   Radiology Reports DG Chest 2 View  Result Date: 05/24/2021 CLINICAL DATA:  Chest pain EXAM: CHEST - 2 VIEW COMPARISON:  03/15/2021 FINDINGS: Transverse diameter of heart is increased. Central pulmonary vessels are prominent. There is interval improvement in aeration of right lower lung fields. There is faint haziness in the right lower lung fields. Costophrenic angles are clear. There is no pneumothorax. There is extrinsic pressure over the left lateral margin of trachea in the lower neck suggesting enlarged thyroid. IMPRESSION: Cardiomegaly. Central pulmonary vessels are prominent suggesting CHF. Faint haziness in the right lower lung fields may suggest asymmetric pulmonary edema. There is no focal pulmonary consolidation. There is no pleural effusion. Electronically Signed   By: Elmer Picker M.D.   On: 05/24/2021 20:10   DG Chest Port 1 View  Result Date: 05/26/2021 CLINICAL DATA:  Shortness of breath EXAM: PORTABLE CHEST 1 VIEW COMPARISON:  Yesterday FINDINGS: Cardiomegaly and vascular pedicle widening with diffuse congested appearance of vessels. No Kerley lines, effusion, or pneumothorax seen. Rightward tracheal deviation known from 2021 CT and thyroidal. IMPRESSION: Cardiomegaly and vascular congestion. Electronically Signed   By: Jorje Guild M.D.   On: 05/26/2021 06:40   DG Chest Port 1 View  Result Date: 05/26/2021 CLINICAL DATA:  Shortness of breath EXAM: PORTABLE CHEST 1 VIEW COMPARISON:  05/24/2021 FINDINGS: Cardiac shadow is enlarged but stable. Increased central vascular congestion is again seen and worsened in the interval. Patchy atelectatic changes are noted in the right base. IMPRESSION: Increasing  vascular congestion with right basilar atelectasis. Electronically Signed   By: Inez Catalina M.D.   On: 05/26/2021 00:18

## 2021-05-26 NOTE — ED Notes (Signed)
Pt's soft restraints removed at this time. Respiratory therapist at bedside to place pt on BiPAP

## 2021-05-26 NOTE — ED Notes (Signed)
Pt's belongings transported to [facility]

## 2021-05-26 NOTE — Consult Note (Addendum)
[facility] Face-to-Face Psychiatry Consult   Reason for Consult:  ''psychosis, capacity determination'' Referring Physician:  Lala Lund, MD Patient Identification: Breanna Moore MRN:  [id number] Principal Diagnosis: Acute respiratory failure with hypercapnia ([place]) Diagnosis:  Principal Problem:   Acute respiratory failure with hypercapnia ([place]) Active Problems:   Schizoaffective disorder, bipolar type ([place])   Essential hypertension   OSA (obstructive sleep apnea)   BMI 60.0-69.9, adult ([place])   Obesity hypoventilation syndrome ([place])   Acute on chronic diastolic CHF (congestive heart failure) ([place])   Total Time spent with patient: 1 hour  Subjective:   Breanna Moore is a 46 y.o. female patient admitted with erratic behavior.  HPI:  46 year old female with medical history of hypertension, morbid obesity, obesity hypoventilation syndrome (has followed with Dr. Camillo Flaming with [facility] in the past), restrictive lung disease, severe obstructive sleep apnea (PSG 10/2019 AHI 63) and was admitted for chest and abdominal pain. Patient refused to cooperate, she is belligerent, sarcastic and a meaningful capacity determination and psychiatric evaluation is difficult at this time. However, chart reports and collateral information revealed that patient has along history of Schizoaffective disorder-mixed type, personality disorder and panic disorder for which she has been seeing Dr. Lendon Colonel in [facility] [place](last appointment-11/22/20). She was prescribe Prozac and Abilify but essentially non-complaint. Today, her behavior remains highly erratic, she is easily agitated, verbally aggressive, defiant, oppositional and appears to be a danger to self and other.    Past Psychiatric History: as above  Risk to Self:  yes Risk to Others:  yes Prior Inpatient Therapy:  [facility]-10/27/20 Prior Outpatient Therapy:  Dr. Cordie Grice, [place], [place]  Past Medical History:  Past Medical History:   Diagnosis Date   BMI 60.0-69.9, adult ([place])    Depression    Hypertension    Morbid obesity ([place])    Non-toxic multinodular goiter 08/29/2019   CTA-Chest 08/28/19: Asymmetric enlargement thyroid lobes, L>R, extending retrosternal with slight mass effect upon the trachea and mild LEFT-to-RIGHT midline shift.   Sleep apnea     Past Surgical History:  Procedure Laterality Date   CESAREAN SECTION     Family History:  Family History  Problem Relation Age of Onset   Schizophrenia Mother    Family Psychiatric  History:  Social History:  Social History   Substance and Sexual Activity  Alcohol Use Yes   Alcohol/week: 0.0 standard drinks   Comment: occasional     Social History   Substance and Sexual Activity  Drug Use No    Social History   Socioeconomic History   Marital status: Single    Spouse name: Not on file   Number of children: Not on file   Years of education: Not on file   Highest education level: Not on file  Occupational History   Not on file  Tobacco Use   Smoking status: Every Day    Types: Cigarettes   Smokeless tobacco: Never  Vaping Use   Vaping Use: Never used  Substance and Sexual Activity   Alcohol use: Yes    Alcohol/week: 0.0 standard drinks    Comment: occasional   Drug use: No   Sexual activity: Never    Birth control/protection: None  Other Topics Concern   Not on file  Social History Narrative   Not on file   Social Determinants of Health   Financial Resource Strain: Not on file  Food Insecurity: Not on file  Transportation Needs: Not on file  Physical Activity: Not  on file  Stress: Not on file  Social Connections: Not on file   Additional Social History:    Allergies:   Allergies  Allergen Reactions   Geodon [Ziprasidone Hcl] Other (See Comments)    Patient became hypoxic and required nonrebreather. Please avoid or try low dose    Hydrocodone-Acetaminophen    Ketorolac    Paroxetine Hcl     Other reaction(s): Confusion    Penicillins Rash    Has patient had a PCN reaction causing immediate rash, facial/tongue/throat swelling, SOB or lightheadedness with hypotension: Yes Has patient had a PCN reaction causing severe rash involving mucus membranes or skin necrosis: No Has patient had a PCN reaction that required hospitalization: No Has patient had a PCN reaction occurring within the last 10 years: No If all of the above answers are "NO", then may proceed with Cephalosporin use.     Labs:  Results for orders placed or performed during the hospital encounter of 05/25/21 (from the past 48 hour(s))  Resp Panel by RT-PCR (Flu A&B, Covid) Nasopharyngeal Swab     Status: None   Collection Time: 05/25/21  4:07 PM   Specimen: Nasopharyngeal Swab; Nasopharyngeal(NP) swabs in vial transport medium  Result Value Ref Range   SARS Coronavirus 2 by RT PCR NEGATIVE NEGATIVE    Comment: (NOTE) SARS-CoV-2 target nucleic acids are NOT DETECTED.  The SARS-CoV-2 RNA is generally detectable in upper respiratory specimens during the acute phase of infection. The lowest concentration of SARS-CoV-2 viral copies this assay can detect is 138 copies/mL. A negative result does not preclude SARS-Cov-2 infection and should not be used as the sole basis for treatment or other patient management decisions. A negative result may occur with  improper specimen collection/handling, submission of specimen other than nasopharyngeal swab, presence of viral mutation(s) within the areas targeted by this assay, and inadequate number of viral copies(<138 copies/mL). A negative result must be combined with clinical observations, patient history, and epidemiological information. The expected result is Negative.  Fact Sheet for Patients:  EntrepreneurPulse.com.au  Fact Sheet for Healthcare Providers:  IncredibleEmployment.be  This test is no t yet approved or cleared by the [place] FDA and  has been  authorized for detection and/or diagnosis of SARS-CoV-2 by FDA under an Emergency Use Authorization (EUA). This EUA will remain  in effect (meaning this test can be used) for the duration of the COVID-19 declaration under Section 564(b)(1) of the Act, 21 U.S.C.section 360bbb-3(b)(1), unless the authorization is terminated  or revoked sooner.       Influenza A by PCR NEGATIVE NEGATIVE   Influenza B by PCR NEGATIVE NEGATIVE    Comment: (NOTE) The Xpert Xpress SARS-CoV-2/FLU/RSV plus assay is intended as an aid in the diagnosis of influenza from Nasopharyngeal swab specimens and should not be used as a sole basis for treatment. Nasal washings and aspirates are unacceptable for Xpert Xpress SARS-CoV-2/FLU/RSV testing.  Fact Sheet for Patients: EntrepreneurPulse.com.au  Fact Sheet for Healthcare Providers: IncredibleEmployment.be  This test is not yet approved or cleared by the [place] FDA and has been authorized for detection and/or diagnosis of SARS-CoV-2 by FDA under an Emergency Use Authorization (EUA). This EUA will remain in effect (meaning this test can be used) for the duration of the COVID-19 declaration under Section 564(b)(1) of the Act, 21 U.S.C. section 360bbb-3(b)(1), unless the authorization is terminated or revoked.  Performed at [facility], [place] [address] [place], [place] [address]   I-Stat arterial blood gas, ED  Status: Abnormal   Collection Time: 05/25/21 10:11 PM  Result Value Ref Range   pH, Arterial 7.252 (L) 7.350 - 7.450   pCO2 arterial 66.0 (HH) 32.0 - 48.0 mmHg   pO2, Arterial 210 (H) 83.0 - 108.0 mmHg   Bicarbonate 29.1 (H) 20.0 - 28.0 mmol/L   TCO2 31 22 - 32 mmol/L   O2 Saturation 100.0 %   Acid-Base Excess 0.0 0.0 - 2.0 mmol/L   Sodium 139 135 - 145 mmol/L   Potassium 3.5 3.5 - 5.1 mmol/L   Calcium, Ion 1.29 1.15 - 1.40 mmol/L   HCT 39.0 36.0 - 46.0 %   Hemoglobin 13.3 12.0 - 15.0 g/dL    Patient temperature 98.6 F    Collection site RADIAL, ALLEN'S TEST ACCEPTABLE    Drawn by Operator    Sample type ARTERIAL    Comment NOTIFIED PHYSICIAN   Ethanol     Status: None   Collection Time: 05/25/21 10:39 PM  Result Value Ref Range   Alcohol, Ethyl (B) <10 <10 mg/dL    Comment: (NOTE) Lowest detectable limit for serum alcohol is 10 mg/dL.  For medical purposes only. Performed at [facility], [place] [address], [place], [place] [id number]   Salicylate level     Status: Abnormal   Collection Time: 05/25/21 10:39 PM  Result Value Ref Range   Salicylate Lvl [id number] (L) 7.0 - 30.0 mg/dL    Comment: Performed at [facility] [address], [place], [place] [address]  Acetaminophen level     Status: Abnormal   Collection Time: 05/25/21 10:39 PM  Result Value Ref Range   Acetaminophen (Tylenol), Serum <10 (L) 10 - 30 ug/mL    Comment: (NOTE) Therapeutic concentrations vary significantly. A range of 10-30 ug/mL  may be an effective concentration for many patients. However, some  are best treated at concentrations outside of this range. Acetaminophen concentrations >150 ug/mL at 4 hours after ingestion  and >50 ug/mL at 12 hours after ingestion are often associated with  toxic reactions.  Performed at [facility], [place] [address] [place], [place] [address]   CBC with Differential/Platelet     Status: Abnormal   Collection Time: 05/25/21 10:40 PM  Result Value Ref Range   WBC 9.8 4.0 - 10.5 K/uL   RBC 5.85 (H) 3.87 - 5.11 MIL/uL   Hemoglobin 10.9 (L) 12.0 - 15.0 g/dL   HCT 40.0 36.0 - 46.0 %   MCV 68.4 (L) 80.0 - 100.0 fL   MCH 18.6 (L) 26.0 - 34.0 pg   MCHC 27.3 (L) 30.0 - 36.0 g/dL   RDW 23.4 (H) 11.5 - 15.5 %   Platelets 360 150 - 400 K/uL   nRBC 0.0 0.0 - 0.2 %   Neutrophils Relative % 72 %   Neutro Abs 7.0 1.7 - 7.7 K/uL   Lymphocytes Relative 22 %   Lymphs Abs 2.1 0.7 - 4.0 K/uL   Monocytes Relative 6 %   Monocytes Absolute 0.6 0.1 - 1.0 K/uL    Eosinophils Relative 0 %   Eosinophils Absolute 0.0 0.0 - 0.5 K/uL   Basophils Relative 0 %   Basophils Absolute 0.0 0.0 - 0.1 K/uL   WBC Morphology MORPHOLOGY UNREMARKABLE    RBC Morphology MORPHOLOGY UNREMARKABLE    Smear Review PLATELETS APPEAR ADEQUATE    Immature Granulocytes 0 %   Abs Immature Granulocytes 0.03 0.00 - 0.07 K/uL   Giant PLTs PRESENT     Comment:  Performed at [facility], [place] [address] [place], [place] [address]  Comprehensive metabolic panel     Status: Abnormal   Collection Time: 05/25/21 10:40 PM  Result Value Ref Range   Sodium 137 135 - 145 mmol/L   Potassium 3.7 3.5 - 5.1 mmol/L   Chloride 102 98 - 111 mmol/L   CO2 24 22 - 32 mmol/L   Glucose, Bld 89 70 - 99 mg/dL    Comment: Glucose reference range applies only to samples taken after fasting for at least 8 hours.   BUN 6 6 - 20 mg/dL   Creatinine, Ser 0.72 0.44 - 1.00 mg/dL   Calcium 9.1 8.9 - 10.3 mg/dL   Total Protein 7.7 6.5 - 8.1 g/dL   Albumin 3.1 (L) 3.5 - 5.0 g/dL   AST 17 15 - 41 U/L   ALT 14 0 - 44 U/L   Alkaline Phosphatase 71 38 - 126 U/L   Total Bilirubin 0.7 0.3 - 1.2 mg/dL   GFR, Estimated >60 >60 mL/min    Comment: (NOTE) Calculated using the CKD-EPI Creatinine Equation (2021)    Anion gap 11 5 - 15    Comment: Performed at [facility] [address], [place], [place] [address]  I-Stat arterial blood gas, ED     Status: Abnormal   Collection Time: 05/26/21  2:52 AM  Result Value Ref Range   pH, Arterial 7.212 (L) 7.350 - 7.450   pCO2 arterial 78.5 (HH) 32.0 - 48.0 mmHg   pO2, Arterial 83 83.0 - 108.0 mmHg   Bicarbonate 31.5 (H) 20.0 - 28.0 mmol/L   TCO2 34 (H) 22 - 32 mmol/L   O2 Saturation 93.0 %   Acid-Base Excess 1.0 0.0 - 2.0 mmol/L   Sodium 141 135 - 145 mmol/L   Potassium 4.2 3.5 - 5.1 mmol/L   Calcium, Ion 1.29 1.15 - 1.40 mmol/L   HCT 39.0 36.0 - 46.0 %   Hemoglobin 13.3 12.0 - 15.0 g/dL   Patient temperature 98.6 F    Collection site RADIAL,  ALLEN'S TEST ACCEPTABLE    Drawn by Operator    Sample type ARTERIAL    Comment NOTIFIED PHYSICIAN   Brain natriuretic peptide     Status: None   Collection Time: 05/26/21  4:43 AM  Result Value Ref Range   B Natriuretic Peptide 52.7 0.0 - 100.0 pg/mL    Comment: Performed at [facility], [place] [address], [place], [place] [address]  HIV Antibody (routine testing w rflx)     Status: None   Collection Time: 05/26/21  4:43 AM  Result Value Ref Range   HIV Screen 4th Generation wRfx Non Reactive Non Reactive    Comment: Performed at [facility], [place] [address] [place], [place] [address]  Comprehensive metabolic panel     Status: Abnormal   Collection Time: 05/26/21  4:43 AM  Result Value Ref Range   Sodium 140 135 - 145 mmol/L   Potassium 4.6 3.5 - 5.1 mmol/L   Chloride 104 98 - 111 mmol/L   CO2 27 22 - 32 mmol/L   Glucose, Bld 77 70 - 99 mg/dL    Comment: Glucose reference range applies only to samples taken after fasting for at least 8 hours.   BUN 6 6 - 20 mg/dL   Creatinine, Ser 0.73 0.44 - 1.00 mg/dL   Calcium 8.9 8.9 - 10.3 mg/dL   Total Protein 7.2 6.5 - 8.1 g/dL  Albumin 2.8 (L) 3.5 - 5.0 g/dL   AST 21 15 - 41 U/L   ALT 15 0 - 44 U/L   Alkaline Phosphatase 68 38 - 126 U/L   Total Bilirubin 1.0 0.3 - 1.2 mg/dL   GFR, Estimated >60 >60 mL/min    Comment: (NOTE) Calculated using the CKD-EPI Creatinine Equation (2021)    Anion gap 9 5 - 15    Comment: Performed at [facility] [address] [place], [place] [address]  Magnesium     Status: None   Collection Time: 05/26/21  4:43 AM  Result Value Ref Range   Magnesium 1.9 1.7 - 2.4 mg/dL    Comment: Performed at [facility] [address] [place], [place] [address]  CBC WITH DIFFERENTIAL     Status: Abnormal   Collection Time: 05/26/21  4:43 AM  Result Value Ref Range   WBC 8.0 4.0 - 10.5 K/uL   RBC 5.70 (H) 3.87 - 5.11 MIL/uL   Hemoglobin 10.8 (L) 12.0 - 15.0 g/dL   HCT 39.3 36.0 - 46.0  %   MCV 68.9 (L) 80.0 - 100.0 fL   MCH 18.9 (L) 26.0 - 34.0 pg   MCHC 27.5 (L) 30.0 - 36.0 g/dL   RDW 23.7 (H) 11.5 - 15.5 %   Platelets 266 150 - 400 K/uL   nRBC 0.0 0.0 - 0.2 %   Neutrophils Relative % 79 %   Neutro Abs 6.2 1.7 - 7.7 K/uL   Lymphocytes Relative 14 %   Lymphs Abs 1.1 0.7 - 4.0 K/uL   Monocytes Relative 7 %   Monocytes Absolute 0.6 0.1 - 1.0 K/uL   Eosinophils Relative 0 %   Eosinophils Absolute 0.0 0.0 - 0.5 K/uL   Basophils Relative 0 %   Basophils Absolute 0.0 0.0 - 0.1 K/uL   WBC Morphology MORPHOLOGY UNREMARKABLE    RBC Morphology MORPHOLOGY UNREMARKABLE    Smear Review MORPHOLOGY UNREMARKABLE    Immature Granulocytes 0 %   Abs Immature Granulocytes 0.02 0.00 - 0.07 K/uL   Polychromasia PRESENT     Comment: Performed at [facility], [place] [address] [place], [place] [address]  C-reactive protein     Status: Abnormal   Collection Time: 05/26/21  4:43 AM  Result Value Ref Range   CRP 6.1 (H) <1.0 mg/dL    Comment: Performed at [facility] [address], [place], [place] [address]  Procalcitonin - Baseline     Status: None   Collection Time: 05/26/21  4:43 AM  Result Value Ref Range   Procalcitonin <0.10 ng/mL    Comment:        Interpretation: PCT (Procalcitonin) <= 0.5 ng/mL: Systemic infection (sepsis) is not likely. Local bacterial infection is possible. (NOTE)       Sepsis PCT Algorithm           Lower Respiratory Tract                                      Infection PCT Algorithm    ----------------------------     ----------------------------         PCT < 0.25 ng/mL                PCT < 0.10 ng/mL          Strongly encourage  Strongly discourage   discontinuation of antibiotics    initiation of antibiotics    ----------------------------     -----------------------------       PCT 0.25 - 0.50 ng/mL            PCT 0.10 - 0.25 ng/mL               OR       >80% decrease in PCT            Discourage initiation of                                             antibiotics      Encourage discontinuation           of antibiotics    ----------------------------     -----------------------------         PCT >= 0.50 ng/mL              PCT 0.26 - 0.50 ng/mL               AND        <80% decrease in PCT             Encourage initiation of                                             antibiotics       Encourage continuation           of antibiotics    ----------------------------     -----------------------------        PCT >= 0.50 ng/mL                  PCT > 0.50 ng/mL               AND         increase in PCT                  Strongly encourage                                      initiation of antibiotics    Strongly encourage escalation           of antibiotics                                     -----------------------------                                           PCT <= 0.25 ng/mL                                                 OR                                        >  80% decrease in PCT                                      Discontinue / Do not initiate                                             antibiotics  Performed at [facility], [place] [address], [place], [place] [address]   Blood gas, arterial     Status: Abnormal   Collection Time: 05/26/21  6:00 AM  Result Value Ref Range   FIO2 30.00    pH, Arterial 7.336 (L) 7.350 - 7.450   pCO2 arterial 54.1 (H) 32.0 - 48.0 mmHg   pO2, Arterial 68.4 (L) 83.0 - 108.0 mmHg   Bicarbonate 28.2 (H) 20.0 - 28.0 mmol/L   Acid-Base Excess 2.9 (H) 0.0 - 2.0 mmol/L   O2 Saturation 92.6 %   Patient temperature 36.7    Collection site RIGHT RADIAL    Drawn by [phone]    Sample type ARTERIAL    Allens test (pass/fail) POSITIVE (A) PASS    Comment: Performed at [facility], [place] [address], [place], [place] [address]    Current Facility-Administered Medications  Medication Dose Route Frequency Provider Last Rate Last Admin   acetaminophen  (TYLENOL) tablet 650 mg  650 mg Oral Q6H PRN Shalhoub, Sherryll Burger, MD       Or   acetaminophen (TYLENOL) suppository 650 mg  650 mg Rectal Q6H PRN Shalhoub, Sherryll Burger, MD       ARIPiprazole (ABILIFY) tablet 10 mg  10 mg Oral Daily Shalhoub, Sherryll Burger, MD   10 mg at 05/26/21 0939   asenapine (SAPHRIS) sublingual tablet 10 mg  10 mg Sublingual BID PRN Charon Smedberg, MD       chlorhexidine (PERIDEX) 0.12 % solution 15 mL  15 mL Mouth Rinse BID Thurnell Lose, MD       enoxaparin (LOVENOX) injection 40 mg  40 mg Subcutaneous Daily Shalhoub, Sherryll Burger, MD       FLUoxetine (PROZAC) capsule 20 mg  20 mg Oral Daily Shalhoub, Sherryll Burger, MD   20 mg at 05/26/21 [id number]   furosemide (LASIX) tablet 40 mg  40 mg Oral Daily Thurnell Lose, MD   40 mg at 05/26/21 1316   hydrALAZINE (APRESOLINE) injection 10 mg  10 mg Intravenous Q6H PRN Shalhoub, Sherryll Burger, MD       hydrOXYzine (ATARAX) tablet 25 mg  25 mg Oral Q6H PRN Shalhoub, Sherryll Burger, MD       ipratropium-albuterol (DUONEB) 0.5-2.5 (3) MG/3ML nebulizer solution 3 mL  3 mL Nebulization Q4H PRN Shalhoub, Sherryll Burger, MD       lisinopril (ZESTRIL) tablet 10 mg  10 mg Oral Daily Shalhoub, Sherryll Burger, MD   10 mg at 05/26/21 [id number]   MEDLINE mouth rinse  15 mL Mouth Rinse q12n4p Thurnell Lose, MD       ondansetron [facility]) tablet 4 mg  4 mg Oral Q6H PRN Shalhoub, Sherryll Burger, MD       Or   ondansetron [facility]) injection 4 mg  4 mg Intravenous Q6H PRN Shalhoub, Sherryll Burger, MD       polyethylene glycol (MIRALAX / GLYCOLAX) packet 17 g  17 g  Oral Daily PRN Shalhoub, Sherryll Burger, MD        Musculoskeletal: Strength & Muscle Tone: uncooperative with assessment. Gait & Station: uncooperative with assessment. Patient leans: N/A     Psychiatric Specialty Exam:  Presentation  General Appearance: Appropriate for Environment  Eye Contact:Minimal  Speech:Normal Rate  Speech Volume:Increased  Handedness:Right   Mood and Affect   Mood:Irritable  Affect:Labile   Thought Process  Thought Processes:Disorganized  Descriptions of Associations:Tangential  Orientation:Other (comment) (uncooperative with assessment)  Thought Content:Tangential  History of Schizophrenia/Schizoaffective disorder:Yes  Duration of Psychotic Symptoms:Greater than six months  Hallucinations:Hallucinations: -- (uncooperative with assessment)  Ideas of Reference:None  Suicidal Thoughts:Suicidal Thoughts: No  Homicidal Thoughts:Homicidal Thoughts: No   Sensorium  Memory:-- (uncooperative with assessment)  Judgment:Poor  Insight:Lacking   Executive Functions  Concentration:Poor  Attention Span:Poor  Recall:-- (uncooperative with assessment)  Fund of Knowledge:-- (uncooperative with assessment)  Language:Fair   Psychomotor Activity  Psychomotor Activity:Psychomotor Activity: Increased   Assets  Assets:-- (uncooperative with assessment)   Sleep  Sleep:Sleep: Fair   Physical Exam: Physical Exam Review of Systems  Psychiatric/Behavioral:  The patient has insomnia.   Blood pressure (!) 151/89, pulse 89, temperature 98.4 F (36.9 C), temperature source Axillary, resp. rate (!) 22, height 5\' 5"  (1.651 m), SpO2 94 %. Body mass index is 45.76 kg/m.  Treatment Plan Summary: 46 year old female with long history of mental illness, multiple medical issues who is non-compliant with psychiatric medications. Psychiatric consult was initiated for psychosis and capacity determination. However, patient is belligerent and uncooperative with full evaluation.  Base on her current behavior, she does not appear to have Capacity to make informed medical consent. Also, she will benefit from psychiatric medication management and optimization of medical treatment.  Recommendations: -Continue 1:1 safety sitter -Continue IVC commitment-patient appears to be a danger to self and others -Continue Abilify 10 mg daily for  psychosis/mood and Prozac 20 mg daily for mood -Add Asenapine 10 mg S/L twice daily as needed for agitation and psychosis -Consider Haldol 10 mg IM plus Benadryl 25 mg IM every 12 hours as needed for agitation/psychosis if patient refused po Asenapine - 12 leads EKG may be warranted to monitor QTc interval if patient takes psychotropic daily -Social worker consult to facilitate inpatient psychiatric admission after patient is medically stabilized   Disposition: Recommend psychiatric Inpatient admission when medically cleared. Psychiatric service will follow  Corena Pilgrim, MD 05/26/2021 3:22 PM

## 2021-05-26 NOTE — Assessment & Plan Note (Deleted)
·   Please see assessment and plan above °

## 2021-05-26 NOTE — Assessment & Plan Note (Signed)
With underlying OSA, OHS and noncompliance with BiPAP.  Counseled on compliance.  Follow with PCP on weight loss

## 2021-05-26 NOTE — Assessment & Plan Note (Addendum)
·   Longstanding known history of obesity hypoventilation syndrome with patient having followed with Dr. Su Monks with [facility]ology [facility], BiPAP nightly, weight loss with PCP thereafter outpatient follow-up with her pulmonologist postdischarge

## 2021-05-26 NOTE — Progress Notes (Signed)
Unable to do complete all admission assessment due to pt non verbal and on BIPAp.

## 2021-05-26 NOTE — Assessment & Plan Note (Addendum)
- 

## 2021-05-26 NOTE — Progress Notes (Signed)
Notified by RN staff that pt very agitated and would not take her medications. Ms. Hernandes demanded that she speak to doctor.  I came to room but Ms. Stankowski would not speak to me or state what her concerns were. She was pacing around the room. Security was called to room due to her agitation.  She was given IM benadryl and Haldol per psychiatry recommendations.  Pt encouraged to sit on bed to calm down.  She keeps pacing floor in room and occasionally takes breath from CPAP mask or nasal cannula but does not leave them on.  She does not state what her concerns are or why she wanted to speak to the physician.  Continue to monitor and security staying until medication helps relieve the agitation.

## 2021-05-26 NOTE — Assessment & Plan Note (Addendum)
Due to combination of acute on chronic diastolic CHF, receiving Geodon in the ER, underlying OSA and OHS and noncompliance with BiPAP with severe toxic encephalopathy.  She has responded well to Lasix with diuresis, along with BiPAP.  Encephalopathy has resolved.  Continue diuresis with Lasix, patient currently agreeable to taking the medicine.  We will continue nighttime BiPAP.  Medically much improved and close to her baseline.

## 2021-05-27 DIAGNOSIS — J9602 Acute respiratory failure with hypercapnia: Secondary | ICD-10-CM | POA: Diagnosis not present

## 2021-05-27 DIAGNOSIS — R4182 Altered mental status, unspecified: Secondary | ICD-10-CM

## 2021-05-27 LAB — COMPREHENSIVE METABOLIC PANEL
ALT: 13 U/L (ref 0–44)
AST: 16 U/L (ref 15–41)
Albumin: 2.8 g/dL — ABNORMAL LOW (ref 3.5–5.0)
Alkaline Phosphatase: 59 U/L (ref 38–126)
Anion gap: 6 (ref 5–15)
BUN: 9 mg/dL (ref 6–20)
CO2: 30 mmol/L (ref 22–32)
Calcium: 8.8 mg/dL — ABNORMAL LOW (ref 8.9–10.3)
Chloride: 100 mmol/L (ref 98–111)
Creatinine, Ser: 0.76 mg/dL (ref 0.44–1.00)
GFR, Estimated: >60 mL/min (ref >60)
Glucose, Bld: 97 mg/dL (ref 70–99)
Potassium: 4.3 mmol/L (ref 3.5–5.1)
Sodium: 136 mmol/L (ref 135–145)
Total Bilirubin: 0.6 mg/dL (ref 0.3–1.2)
Total Protein: 7.4 g/dL (ref 6.5–8.1)

## 2021-05-27 LAB — CBC WITH DIFFERENTIAL/PLATELET
Abs Immature Granulocytes: 0.02 10*3/uL (ref 0.00–0.07)
Basophils Absolute: 0 10*3/uL (ref 0.0–0.1)
Basophils Relative: 0 %
Eosinophils Absolute: 0 10*3/uL (ref 0.0–0.5)
Eosinophils Relative: 1 %
HCT: 36.8 % (ref 36.0–46.0)
Hemoglobin: 10.3 g/dL — ABNORMAL LOW (ref 12.0–15.0)
Immature Granulocytes: 0 %
Lymphocytes Relative: 19 %
Lymphs Abs: 1.6 10*3/uL (ref 0.7–4.0)
MCH: 19.1 pg — ABNORMAL LOW (ref 26.0–34.0)
MCHC: 28 g/dL — ABNORMAL LOW (ref 30.0–36.0)
MCV: 68.4 fL — ABNORMAL LOW (ref 80.0–100.0)
Monocytes Absolute: 0.5 10*3/uL (ref 0.1–1.0)
Monocytes Relative: 5 %
Neutro Abs: 6.3 10*3/uL (ref 1.7–7.7)
Neutrophils Relative %: 75 %
Platelets: 331 10*3/uL (ref 150–400)
RBC: 5.38 MIL/uL — ABNORMAL HIGH (ref 3.87–5.11)
RDW: 23.2 % — ABNORMAL HIGH (ref 11.5–15.5)
WBC: 8.4 10*3/uL (ref 4.0–10.5)
nRBC: 0 % (ref 0.0–0.2)

## 2021-05-27 LAB — BRAIN NATRIURETIC PEPTIDE: B Natriuretic Peptide: 27.3 pg/mL (ref 0.0–100.0)

## 2021-05-27 LAB — C-REACTIVE PROTEIN: CRP: 6 mg/dL — ABNORMAL HIGH (ref <1.0)

## 2021-05-27 LAB — MAGNESIUM: Magnesium: 2 mg/dL (ref 1.7–2.4)

## 2021-05-27 MED ORDER — DIVALPROEX SODIUM 250 MG PO DR TAB
500.0000 mg | DELAYED_RELEASE_TABLET | Freq: Two times a day (BID) | ORAL | Status: DC
  Administered 2021-05-27 – 2021-05-31 (×9): 500 mg via ORAL
  Filled 2021-05-27 (×9): qty 2

## 2021-05-27 MED ORDER — FUROSEMIDE 10 MG/ML IJ SOLN
60.0000 mg | Freq: Once | INTRAMUSCULAR | Status: DC
Start: 2021-05-27 — End: 2021-05-27

## 2021-05-27 MED ORDER — VALPROATE SODIUM 100 MG/ML IV SOLN: 500.0000 mg | Freq: Two times a day (BID) | INTRAVENOUS | Status: DC

## 2021-05-27 MED ORDER — CHLORHEXIDINE GLUCONATE CLOTH 2 % EX PADS
6.0000 | MEDICATED_PAD | Freq: Every day | CUTANEOUS | Status: DC
  Administered 2021-05-27 – 2021-05-29 (×3): 6 via TOPICAL

## 2021-05-27 MED ORDER — FUROSEMIDE 10 MG/ML IJ SOLN
60.0000 mg | Freq: Two times a day (BID) | INTRAMUSCULAR | Status: DC
  Administered 2021-05-27: 60 mg via INTRAVENOUS
  Filled 2021-05-27 (×2): qty 6

## 2021-05-27 MED ORDER — ALBUTEROL SULFATE (2.5 MG/3ML) 0.083% IN NEBU
2.5000 mg | INHALATION_SOLUTION | Freq: Once | RESPIRATORY_TRACT | Status: AC
  Administered 2021-05-27: 2.5 mg via RESPIRATORY_TRACT
  Filled 2021-05-27: qty 3

## 2021-05-27 MED ORDER — SODIUM CHLORIDE 0.9% FLUSH
10.0000 mL | Freq: Two times a day (BID) | INTRAVENOUS | Status: DC
  Administered 2021-05-27 – 2021-05-31 (×9): 10 mL

## 2021-05-27 MED ORDER — DIVALPROEX SODIUM 250 MG PO DR TAB: 250.0000 mg | DELAYED_RELEASE_TABLET | Freq: Four times a day (QID) | ORAL | Status: DC

## 2021-05-27 MED ORDER — DIVALPROEX SODIUM 250 MG PO DR TAB: 500.0000 mg | DELAYED_RELEASE_TABLET | Freq: Two times a day (BID) | ORAL | Status: DC

## 2021-05-27 MED ORDER — VALPROATE SODIUM 100 MG/ML IV SOLN: 250.0000 mg | Freq: Four times a day (QID) | INTRAVENOUS | Status: DC

## 2021-05-27 MED ORDER — FUROSEMIDE 10 MG/ML IJ SOLN
60.0000 mg | Freq: Once | INTRAMUSCULAR | Status: AC
  Administered 2021-05-27: 60 mg via INTRAVENOUS
  Filled 2021-05-27: qty 6

## 2021-05-27 MED ORDER — SODIUM CHLORIDE 0.9% FLUSH: 10.0000 mL | INTRAVENOUS | Status: DC | PRN

## 2021-05-27 NOTE — Progress Notes (Signed)
PROGRESS NOTE                                                                                                                                                                                                             Patient Demographics:    Breanna Moore, is a 46 y.o. female, DOB - December 18, 1975, [id number]  Outpatient Primary MD for the patient is Gerald Leitz    LOS - 1  Admit date - 05/25/2021    Chief Complaint  Patient presents with   Psychiatric Evaluation       Brief Narrative (HPI from H&P)   46 year old female with past medical history of schizoaffective disorder, hypertension, morbid obesity, obesity hypoventilation syndrome (has followed with Dr. Camillo Flaming with [facility] Pulmonology in the past), restrictive lung disease, severe obstructive sleep apnea (PSG 10/2019 AHI 63) who presented to [facility] emergency department via EMS with complaints of chest and abdominal pain, apparently she has stopped taking her psych medications at home for a while and has not been using her BiPAP as she has lost the machine, in the ER she exhibited highly erratic behavior, she took her clothes off and was pacing in the ER, she was aggressive to the staff, she subsequently received Geodon and then developed hypercapnic respiratory failure.   Subjective:   Patient in bed denies any headache chest pain or abdominal pain, more calm this morning, is short of breath this morning   Assessment  & Plan :    * Acute respiratory failure with hypercapnia (HCC)- (present on admission) Due to combination of acute on chronic diastolic CHF, receiving Geodon in the ER, underlying OSA and OHS and noncompliance with BiPAP with severe toxic encephalopathy.  She has responded well to Lasix with diuresis, continue IV Lasix as and when she is agreeable to take, overnight BiPAP treatment, ABGs have improved and her mentation has  improved considerably, she is still noncompliant with medications and as soon as she is feeling better she has started to take off her leads and IV.   Continue supportive care, for now nighttime BiPAP if she complies, diurese as available and monitor.      Schizoaffective disorder, bipolar type ([place])- (present on admission) Per patient's mother at the time of admission patient has been exhibiting highly erratic behavior since July, she has been  noncompliant with her psych medications and other medications.  She is exhibiting the same behavior in the hospital psych has been consulted will defer further management of this issue to psych, her medical issues all arise from noncompliance with medications which I think is due to her underlying psych issue.  She will benefit from inpatient psych admission in my opinion have conveyed this to the psych team.  Obesity hypoventilation syndrome ([place])- (present on admission) Longstanding known history of obesity hypoventilation syndrome with patient having followed with Dr. Camillo Flaming with pulmonology at [facility], BiPAP nightly, weight loss with PCP thereafter outpatient follow-up with her pulmonologist postdischarge   OSA (obstructive sleep apnea)- (present on admission) See #1 above  Essential hypertension- (present on admission) Resume patients home regimen of oral antihypertensives Titrate antihypertensive regimen as necessary to achieve adequate BP control PRN intravenous antihypertensives for excessively elevated blood pressure    Acute on chronic diastolic CHF (congestive heart failure) ([place])- (present on admission) Please see assessment and plan above  BMI 60.0-69.9, adult ([place]) With underlying OSA, OHS and noncompliance with BiPAP.  Counseled on compliance.  Follow with PCP on weight loss            Condition - Extremely Guarded  Family Communication  :  called Mother miss Santiago Glad [phone] on 05/26/2021 at 11:49 AM and message  left  Code Status :  Full  Consults  :  Psych  PUD Prophylaxis :    Procedures  :            Disposition Plan  :    Status is: Inpatient   DVT Prophylaxis  :    enoxaparin (LOVENOX) injection 40 mg Start: 05/26/21 1000    Lab Results  Component Value Date   PLT 331 05/27/2021    Diet :  Diet Order             DIET DYS 3 Room service appropriate? No; Fluid consistency: Thin  Diet effective now                    Inpatient Medications  Scheduled Meds:  ARIPiprazole  10 mg Oral Daily   chlorhexidine  15 mL Mouth Rinse BID   Chlorhexidine Gluconate Cloth  6 each Topical Daily   enoxaparin (LOVENOX) injection  40 mg Subcutaneous Daily   FLUoxetine  20 mg Oral Daily   furosemide  60 mg Intravenous BID   lisinopril  10 mg Oral Daily   mouth rinse  15 mL Mouth Rinse q12n4p   sodium chloride flush  10-40 mL Intracatheter Q12H   Continuous Infusions: PRN Meds:.acetaminophen **OR** acetaminophen, alum & mag hydroxide-simeth, asenapine, haloperidol lactate, hydrALAZINE, hydrOXYzine, ipratropium-albuterol, melatonin, ondansetron **OR** ondansetron (ZOFRAN) IV, polyethylene glycol, sodium chloride flush  Antibiotics  :    Anti-infectives (From admission, onward)    None        Time Spent in minutes  30   Lala Lund M.D on 05/27/2021 at 10:52 AM  To page go to [email or web address]   [facility] -  Office  [phone]  See all Orders from today for further details    Objective:   Vitals:   05/26/21 0740 05/26/21 0939 05/27/21 0813 05/27/21 0821  BP:  (!) 151/89 129/73   Pulse: 89  77   Resp: (!) 22  16   Temp:      TempSrc:      SpO2: 97% 94% 100% 99%  Height:        Wt Readings  from Last 3 Encounters:  04/19/21 124.7 kg  03/15/21 (!) 147.4 kg  01/22/21 (!) 174.2 kg     Intake/Output Summary (Last 24 hours) at 05/27/2021 1052 Last data filed at 05/26/2021 1300 Gross per 24 hour  Intake 360 ml  Output --  Net 360 ml      Physical Exam  Awake Alert, No new F.N deficits, Normal affect [place].AT,PERRAL Supple Neck, No JVD,   Symmetrical Chest wall movement, Good air movement bilaterally, =ve rales RRR,No Gallops, Rubs or new Murmurs,  +ve B.Sounds, Abd Soft, No tenderness,   No Cyanosis, Clubbing or edema        Data Review:    CBC Recent Labs  Lab 05/21/21 1408 05/24/21 1942 05/25/21 1136 05/25/21 2211 05/25/21 2240 05/26/21 0252 05/26/21 0443 05/27/21 1000  WBC 8.0 9.3 8.5  --  9.8  --  8.0 8.4  HGB 11.4* 11.4* 12.1 13.3 10.9* 13.3 10.8* 10.3*  HCT 41.6 40.9 42.2 39.0 40.0 39.0 39.3 36.8  PLT 388 388 363  --  360  --  266 331  MCV 69.3* 68.7* 67.1*  --  68.4*  --  68.9* 68.4*  MCH 19.0* 19.2* 19.2*  --  18.6*  --  18.9* 19.1*  MCHC 27.4* 27.9* 28.7*  --  27.3*  --  27.5* 28.0*  RDW 24.0* 24.0* 23.4*  --  23.4*  --  23.7* 23.2*  LYMPHSABS 2.3  --   --   --  2.1  --  1.1 1.6  MONOABS 0.5  --   --   --  0.6  --  0.6 0.5  EOSABS 0.1  --   --   --  0.0  --  0.0 0.0  BASOSABS 0.0  --   --   --  0.0  --  0.0 0.0    Electrolytes Recent Labs  Lab 05/24/21 1942 05/25/21 1136 05/25/21 2211 05/25/21 2240 05/26/21 0252 05/26/21 0443 05/27/21 1000  NA 132* 135 139 137 141 140 136  K 3.6 3.6 3.5 3.7 4.2 4.6 4.3  CL 98 101  --  102  --  104 100  CO2 26 26  --  24  --  27 30  GLUCOSE 82 93  --  89  --  77 97  BUN 9 7  --  6  --  6 9  CREATININE 0.71 0.75  --  0.72  --  0.73 0.76  CALCIUM 9.0 9.3  --  9.1  --  8.9 8.8*  AST 17 14*  --  17  --  21 16  ALT 14 14  --  14  --  15 13  ALKPHOS 71 76  --  71  --  68 59  BILITOT 0.7 0.6  --  0.7  --  1.0 0.6  ALBUMIN 3.4* 3.4*  --  3.1*  --  2.8* 2.8*  MG  --   --   --   --   --  1.9 2.0  CRP  --   --   --   --   --  6.1* 6.0*  PROCALCITON  --   --   --   --   --  <0.10  --   BNP  --   --   --   --   --  52.7  --     ------------------------------------------------------------------------------------------------------------------ No  results for input(s): CHOL, HDL, LDLCALC, TRIG, CHOLHDL, LDLDIRECT in the last 72 hours.  No results found  for: HGBA1C  No results for input(s): TSH, T4TOTAL, T3FREE, THYROIDAB in the last 72 hours.  Invalid input(s): FREET3 ------------------------------------------------------------------------------------------------------------------ ID Labs Recent Labs  Lab 05/24/21 1942 05/25/21 1136 05/25/21 2240 05/26/21 0443 05/27/21 1000  WBC 9.3 8.5 9.8 8.0 8.4  PLT 388 363 360 266 331  CRP  --   --   --  6.1* 6.0*  PROCALCITON  --   --   --  <0.10  --   CREATININE 0.71 0.75 0.72 0.73 0.76   Cardiac Enzymes No results for input(s): CKMB, TROPONINI, MYOGLOBIN in the last 168 hours.  Invalid input(s): CK   Radiology Reports DG Chest 2 View  Result Date: 05/24/2021 CLINICAL DATA:  Chest pain EXAM: CHEST - 2 VIEW COMPARISON:  03/15/2021 FINDINGS: Transverse diameter of heart is increased. Central pulmonary vessels are prominent. There is interval improvement in aeration of right lower lung fields. There is faint haziness in the right lower lung fields. Costophrenic angles are clear. There is no pneumothorax. There is extrinsic pressure over the left lateral margin of trachea in the lower neck suggesting enlarged thyroid. IMPRESSION: Cardiomegaly. Central pulmonary vessels are prominent suggesting CHF. Faint haziness in the right lower lung fields may suggest asymmetric pulmonary edema. There is no focal pulmonary consolidation. There is no pleural effusion. Electronically Signed   By: Elmer Picker M.D.   On: 05/24/2021 20:10   DG Chest Port 1 View  Result Date: 05/26/2021 CLINICAL DATA:  Shortness of breath EXAM: PORTABLE CHEST 1 VIEW COMPARISON:  Yesterday FINDINGS: Cardiomegaly and vascular pedicle widening with diffuse congested appearance of vessels. No Kerley lines, effusion, or pneumothorax seen. Rightward tracheal deviation known from 2021 CT and thyroidal. IMPRESSION:  Cardiomegaly and vascular congestion. Electronically Signed   By: Jorje Guild M.D.   On: 05/26/2021 06:40   DG Chest Port 1 View  Result Date: 05/26/2021 CLINICAL DATA:  Shortness of breath EXAM: PORTABLE CHEST 1 VIEW COMPARISON:  05/24/2021 FINDINGS: Cardiac shadow is enlarged but stable. Increased central vascular congestion is again seen and worsened in the interval. Patchy atelectatic changes are noted in the right base. IMPRESSION: Increasing vascular congestion with right basilar atelectasis. Electronically Signed   By: Inez Catalina M.D.   On: 05/26/2021 00:18

## 2021-05-27 NOTE — Progress Notes (Deleted)
m °

## 2021-05-27 NOTE — Progress Notes (Addendum)
Pt called to nurses station stating she was having CP. Upon entering the room, pt stated she does "good right now", RN was being "too much", and motioned for RN to leave. MD Candiss Norse notified.  Pt's been removing tele monitor throughout the day, refusing for it to remain on. Pt left in chair relaxing. Will continue to monitor.

## 2021-05-27 NOTE — Consult Note (Addendum)
[facility] Face-to-Face Psychiatry Consult   Reason for Consult:  ''psychosis, capacity determination'' Referring Physician:  Lala Lund, MD Patient Identification: Breanna Moore MRN:  [id number] Principal Diagnosis: Acute respiratory failure with hypercapnia ([place]) Diagnosis:  Principal Problem:   Acute respiratory failure with hypercapnia ([place]) Active Problems:   Schizoaffective disorder, bipolar type ([place])   Essential hypertension   OSA (obstructive sleep apnea)   BMI 60.0-69.9, adult ([place])   Obesity hypoventilation syndrome ([place])   Acute on chronic diastolic CHF (congestive heart failure) ([place])   Total Time spent with patient: 30 minutes  Subjective:   Breanna Moore is a 46 y.o. female patient admitted with erratic behavior, chest and abdominal pain.  05/27/21: Patient seen face to face this morning but unable to participate in psychiatric interview due to being asleep. Per staff nurse and hospitalist, patient still refuse to accept most of the medications prescribed for her. Her behavior continues to be erratic, belligerent and easily agitated. Patient still meets criteria for inpatient psychiatric admission once medically stabilized.  05/26/21: HPI:  46 year old female with medical history of hypertension, morbid obesity, obesity hypoventilation syndrome (has followed with Dr. Camillo Flaming with [facility] in the past), restrictive lung disease, severe obstructive sleep apnea (PSG 10/2019 AHI 63) and was admitted for chest and abdominal pain. Patient refused to cooperate, she is belligerent, sarcastic and a meaningful capacity determination and psychiatric evaluation is difficult at this time. However, chart reports and collateral information revealed that patient has along history of Schizoaffective disorder-mixed type, personality disorder and panic disorder for which she has been seeing Dr. Lendon Colonel in [facility] [place](last appointment-11/22/20). She was prescribe Prozac and  Abilify but essentially non-complaint. Today, her behavior remains highly erratic, she is easily agitated, verbally aggressive, defiant, oppositional and appears to be a danger to self and other.    Past Psychiatric History: as above  Risk to Self:  yes Risk to Others:  yes Prior Inpatient Therapy:  [facility]-10/27/20 Prior Outpatient Therapy:  Dr. Cordie Grice, [place], [place]  Past Medical History:  Past Medical History:  Diagnosis Date   BMI 60.0-69.9, adult ([place])    Depression    Hypertension    Morbid obesity ([place])    Non-toxic multinodular goiter 08/29/2019   CTA-Chest 08/28/19: Asymmetric enlargement thyroid lobes, L>R, extending retrosternal with slight mass effect upon the trachea and mild LEFT-to-RIGHT midline shift.   Sleep apnea     Past Surgical History:  Procedure Laterality Date   CESAREAN SECTION     Family History:  Family History  Problem Relation Age of Onset   Schizophrenia Mother    Family Psychiatric  History:  Social History:  Social History   Substance and Sexual Activity  Alcohol Use Yes   Alcohol/week: 0.0 standard drinks   Comment: occasional     Social History   Substance and Sexual Activity  Drug Use No    Social History   Socioeconomic History   Marital status: Single    Spouse name: Not on file   Number of children: Not on file   Years of education: Not on file   Highest education level: Not on file  Occupational History   Not on file  Tobacco Use   Smoking status: Every Day    Types: Cigarettes   Smokeless tobacco: Never  Vaping Use   Vaping Use: Never used  Substance and Sexual Activity   Alcohol use: Yes    Alcohol/week: 0.0 standard drinks    Comment: occasional  Drug use: No   Sexual activity: Never    Birth control/protection: None  Other Topics Concern   Not on file  Social History Narrative   Not on file   Social Determinants of Health   Financial Resource Strain: Not on file  Food Insecurity: Not on file   Transportation Needs: Not on file  Physical Activity: Not on file  Stress: Not on file  Social Connections: Not on file   Additional Social History:    Allergies:   Allergies  Allergen Reactions   Geodon [Ziprasidone Hcl] Other (See Comments)    Patient became hypoxic and required nonrebreather. Please avoid or try low dose    Hydrocodone-Acetaminophen    Ketorolac    Paroxetine Hcl     Other reaction(s): Confusion   Penicillins Rash    Has patient had a PCN reaction causing immediate rash, facial/tongue/throat swelling, SOB or lightheadedness with hypotension: Yes Has patient had a PCN reaction causing severe rash involving mucus membranes or skin necrosis: No Has patient had a PCN reaction that required hospitalization: No Has patient had a PCN reaction occurring within the last 10 years: No If all of the above answers are "NO", then may proceed with Cephalosporin use.     Labs:  Results for orders placed or performed during the hospital encounter of 05/25/21 (from the past 48 hour(s))  Resp Panel by RT-PCR (Flu A&B, Covid) Nasopharyngeal Swab     Status: None   Collection Time: 05/25/21  4:07 PM   Specimen: Nasopharyngeal Swab; Nasopharyngeal(NP) swabs in vial transport medium  Result Value Ref Range   SARS Coronavirus 2 by RT PCR NEGATIVE NEGATIVE    Comment: (NOTE) SARS-CoV-2 target nucleic acids are NOT DETECTED.  The SARS-CoV-2 RNA is generally detectable in upper respiratory specimens during the acute phase of infection. The lowest concentration of SARS-CoV-2 viral copies this assay can detect is 138 copies/mL. A negative result does not preclude SARS-Cov-2 infection and should not be used as the sole basis for treatment or other patient management decisions. A negative result may occur with  improper specimen collection/handling, submission of specimen other than nasopharyngeal swab, presence of viral mutation(s) within the areas targeted by this assay, and  inadequate number of viral copies(<138 copies/mL). A negative result must be combined with clinical observations, patient history, and epidemiological information. The expected result is Negative.  Fact Sheet for Patients:  EntrepreneurPulse.com.au  Fact Sheet for Healthcare Providers:  IncredibleEmployment.be  This test is no t yet approved or cleared by the [place] FDA and  has been authorized for detection and/or diagnosis of SARS-CoV-2 by FDA under an Emergency Use Authorization (EUA). This EUA will remain  in effect (meaning this test can be used) for the duration of the COVID-19 declaration under Section 564(b)(1) of the Act, 21 U.S.C.section 360bbb-3(b)(1), unless the authorization is terminated  or revoked sooner.       Influenza A by PCR NEGATIVE NEGATIVE   Influenza B by PCR NEGATIVE NEGATIVE    Comment: (NOTE) The Xpert Xpress SARS-CoV-2/FLU/RSV plus assay is intended as an aid in the diagnosis of influenza from Nasopharyngeal swab specimens and should not be used as a sole basis for treatment. Nasal washings and aspirates are unacceptable for Xpert Xpress SARS-CoV-2/FLU/RSV testing.  Fact Sheet for Patients: EntrepreneurPulse.com.au  Fact Sheet for Healthcare Providers: IncredibleEmployment.be  This test is not yet approved or cleared by the [place] FDA and has been authorized for detection and/or diagnosis of SARS-CoV-2 by FDA under  an Emergency Use Authorization (EUA). This EUA will remain in effect (meaning this test can be used) for the duration of the COVID-19 declaration under Section 564(b)(1) of the Act, 21 U.S.C. section 360bbb-3(b)(1), unless the authorization is terminated or revoked.  Performed at [facility], [place] [address], [place], [place] [address]   I-Stat arterial blood gas, ED     Status: Abnormal   Collection Time: 05/25/21 10:11 PM  Result Value  Ref Range   pH, Arterial 7.252 (L) 7.350 - 7.450   pCO2 arterial 66.0 (HH) 32.0 - 48.0 mmHg   pO2, Arterial 210 (H) 83.0 - 108.0 mmHg   Bicarbonate 29.1 (H) 20.0 - 28.0 mmol/L   TCO2 31 22 - 32 mmol/L   O2 Saturation 100.0 %   Acid-Base Excess 0.0 0.0 - 2.0 mmol/L   Sodium 139 135 - 145 mmol/L   Potassium 3.5 3.5 - 5.1 mmol/L   Calcium, Ion 1.29 1.15 - 1.40 mmol/L   HCT 39.0 36.0 - 46.0 %   Hemoglobin 13.3 12.0 - 15.0 g/dL   Patient temperature 98.6 F    Collection site RADIAL, ALLEN'S TEST ACCEPTABLE    Drawn by Operator    Sample type ARTERIAL    Comment NOTIFIED PHYSICIAN   Ethanol     Status: None   Collection Time: 05/25/21 10:39 PM  Result Value Ref Range   Alcohol, Ethyl (B) <10 <10 mg/dL    Comment: (NOTE) Lowest detectable limit for serum alcohol is 10 mg/dL.  For medical purposes only. Performed at [facility], [place] [address], [place], [place] [id number]   Salicylate level     Status: Abnormal   Collection Time: 05/25/21 10:39 PM  Result Value Ref Range   Salicylate Lvl [id number] (L) 7.0 - 30.0 mg/dL    Comment: Performed at [facility] [address] [place], [place] [address]  Acetaminophen level     Status: Abnormal   Collection Time: 05/25/21 10:39 PM  Result Value Ref Range   Acetaminophen (Tylenol), Serum <10 (L) 10 - 30 ug/mL    Comment: (NOTE) Therapeutic concentrations vary significantly. A range of 10-30 ug/mL  may be an effective concentration for many patients. However, some  are best treated at concentrations outside of this range. Acetaminophen concentrations >150 ug/mL at 4 hours after ingestion  and >50 ug/mL at 12 hours after ingestion are often associated with  toxic reactions.  Performed at [facility], [place] [address] [place],  [address]   CBC with Differential/Platelet     Status: Abnormal   Collection Time: 05/25/21 10:40 PM  Result Value Ref Range   WBC 9.8 4.0 - 10.5 K/uL   RBC 5.85 (H) 3.87 - 5.11 MIL/uL    Hemoglobin 10.9 (L) 12.0 - 15.0 g/dL   HCT 40.0 36.0 - 46.0 %   MCV 68.4 (L) 80.0 - 100.0 fL   MCH 18.6 (L) 26.0 - 34.0 pg   MCHC 27.3 (L) 30.0 - 36.0 g/dL   RDW 23.4 (H) 11.5 - 15.5 %   Platelets 360 150 - 400 K/uL   nRBC 0.0 0.0 - 0.2 %   Neutrophils Relative % 72 %   Neutro Abs 7.0 1.7 - 7.7 K/uL   Lymphocytes Relative 22 %   Lymphs Abs 2.1 0.7 - 4.0 K/uL   Monocytes Relative 6 %   Monocytes Absolute 0.6 0.1 - 1.0 K/uL   Eosinophils Relative 0 %   Eosinophils Absolute 0.0 0.0 -  0.5 K/uL   Basophils Relative 0 %   Basophils Absolute 0.0 0.0 - 0.1 K/uL   WBC Morphology MORPHOLOGY UNREMARKABLE    RBC Morphology MORPHOLOGY UNREMARKABLE    Smear Review PLATELETS APPEAR ADEQUATE    Immature Granulocytes 0 %   Abs Immature Granulocytes 0.03 0.00 - 0.07 K/uL   Giant PLTs PRESENT     Comment: Performed at [facility] [address] [place], [place] [address]  Comprehensive metabolic panel     Status: Abnormal   Collection Time: 05/25/21 10:40 PM  Result Value Ref Range   Sodium 137 135 - 145 mmol/L   Potassium 3.7 3.5 - 5.1 mmol/L   Chloride 102 98 - 111 mmol/L   CO2 24 22 - 32 mmol/L   Glucose, Bld 89 70 - 99 mg/dL    Comment: Glucose reference range applies only to samples taken after fasting for at least 8 hours.   BUN 6 6 - 20 mg/dL   Creatinine, Ser 0.72 0.44 - 1.00 mg/dL   Calcium 9.1 8.9 - 10.3 mg/dL   Total Protein 7.7 6.5 - 8.1 g/dL   Albumin 3.1 (L) 3.5 - 5.0 g/dL   AST 17 15 - 41 U/L   ALT 14 0 - 44 U/L   Alkaline Phosphatase 71 38 - 126 U/L   Total Bilirubin 0.7 0.3 - 1.2 mg/dL   GFR, Estimated >60 >60 mL/min    Comment: (NOTE) Calculated using the CKD-EPI Creatinine Equation (2021)    Anion gap 11 5 - 15    Comment: Performed at [facility] [address], [place], [place] [address]  I-Stat arterial blood gas, ED     Status: Abnormal   Collection Time: 05/26/21  2:52 AM  Result Value Ref Range   pH, Arterial 7.212 (L) 7.350 - 7.450    pCO2 arterial 78.5 (HH) 32.0 - 48.0 mmHg   pO2, Arterial 83 83.0 - 108.0 mmHg   Bicarbonate 31.5 (H) 20.0 - 28.0 mmol/L   TCO2 34 (H) 22 - 32 mmol/L   O2 Saturation 93.0 %   Acid-Base Excess 1.0 0.0 - 2.0 mmol/L   Sodium 141 135 - 145 mmol/L   Potassium 4.2 3.5 - 5.1 mmol/L   Calcium, Ion 1.29 1.15 - 1.40 mmol/L   HCT 39.0 36.0 - 46.0 %   Hemoglobin 13.3 12.0 - 15.0 g/dL   Patient temperature 98.6 F    Collection site RADIAL, ALLEN'S TEST ACCEPTABLE    Drawn by Operator    Sample type ARTERIAL    Comment NOTIFIED PHYSICIAN   Brain natriuretic peptide     Status: None   Collection Time: 05/26/21  4:43 AM  Result Value Ref Range   B Natriuretic Peptide 52.7 0.0 - 100.0 pg/mL    Comment: Performed at [facility], [place] [address] [place], [place] [address]  HIV Antibody (routine testing w rflx)     Status: None   Collection Time: 05/26/21  4:43 AM  Result Value Ref Range   HIV Screen 4th Generation wRfx Non Reactive Non Reactive    Comment: Performed at [facility], [place] [address], [place], [place] [address]  Comprehensive metabolic panel     Status: Abnormal   Collection Time: 05/26/21  4:43 AM  Result Value Ref Range   Sodium 140 135 - 145 mmol/L   Potassium 4.6 3.5 - 5.1 mmol/L   Chloride 104 98 - 111 mmol/L   CO2 27 22 -  32 mmol/L   Glucose, Bld 77 70 - 99 mg/dL    Comment: Glucose reference range applies only to samples taken after fasting for at least 8 hours.   BUN 6 6 - 20 mg/dL   Creatinine, Ser 0.73 0.44 - 1.00 mg/dL   Calcium 8.9 8.9 - 10.3 mg/dL   Total Protein 7.2 6.5 - 8.1 g/dL   Albumin 2.8 (L) 3.5 - 5.0 g/dL   AST 21 15 - 41 U/L   ALT 15 0 - 44 U/L   Alkaline Phosphatase 68 38 - 126 U/L   Total Bilirubin 1.0 0.3 - 1.2 mg/dL   GFR, Estimated >60 >60 mL/min    Comment: (NOTE) Calculated using the CKD-EPI Creatinine Equation (2021)    Anion gap 9 5 - 15    Comment: Performed at [facility] [address], [place], [place] [address]   Magnesium     Status: None   Collection Time: 05/26/21  4:43 AM  Result Value Ref Range   Magnesium 1.9 1.7 - 2.4 mg/dL    Comment: Performed at [facility] [address] [place], [place] [address]  CBC WITH DIFFERENTIAL     Status: Abnormal   Collection Time: 05/26/21  4:43 AM  Result Value Ref Range   WBC 8.0 4.0 - 10.5 K/uL   RBC 5.70 (H) 3.87 - 5.11 MIL/uL   Hemoglobin 10.8 (L) 12.0 - 15.0 g/dL   HCT 39.3 36.0 - 46.0 %   MCV 68.9 (L) 80.0 - 100.0 fL   MCH 18.9 (L) 26.0 - 34.0 pg   MCHC 27.5 (L) 30.0 - 36.0 g/dL   RDW 23.7 (H) 11.5 - 15.5 %   Platelets 266 150 - 400 K/uL   nRBC 0.0 0.0 - 0.2 %   Neutrophils Relative % 79 %   Neutro Abs 6.2 1.7 - 7.7 K/uL   Lymphocytes Relative 14 %   Lymphs Abs 1.1 0.7 - 4.0 K/uL   Monocytes Relative 7 %   Monocytes Absolute 0.6 0.1 - 1.0 K/uL   Eosinophils Relative 0 %   Eosinophils Absolute 0.0 0.0 - 0.5 K/uL   Basophils Relative 0 %   Basophils Absolute 0.0 0.0 - 0.1 K/uL   WBC Morphology MORPHOLOGY UNREMARKABLE    RBC Morphology MORPHOLOGY UNREMARKABLE    Smear Review MORPHOLOGY UNREMARKABLE    Immature Granulocytes 0 %   Abs Immature Granulocytes 0.02 0.00 - 0.07 K/uL   Polychromasia PRESENT     Comment: Performed at [facility], [place] [address], [place], [place] [address]  C-reactive protein     Status: Abnormal   Collection Time: 05/26/21  4:43 AM  Result Value Ref Range   CRP 6.1 (H) <1.0 mg/dL    Comment: Performed at [facility] [address], [place],  [address]  Procalcitonin - Baseline     Status: None   Collection Time: 05/26/21  4:43 AM  Result Value Ref Range   Procalcitonin <0.10 ng/mL    Comment:        Interpretation: PCT (Procalcitonin) <= 0.5 ng/mL: Systemic infection (sepsis) is not likely. Local bacterial infection is possible. (NOTE)       Sepsis PCT Algorithm           Lower Respiratory Tract  Infection PCT Algorithm     ----------------------------     ----------------------------         PCT < 0.25 ng/mL                PCT < 0.10 ng/mL          Strongly encourage             Strongly discourage   discontinuation of antibiotics    initiation of antibiotics    ----------------------------     -----------------------------       PCT 0.25 - 0.50 ng/mL            PCT 0.10 - 0.25 ng/mL               OR       >80% decrease in PCT            Discourage initiation of                                            antibiotics      Encourage discontinuation           of antibiotics    ----------------------------     -----------------------------         PCT >= 0.50 ng/mL              PCT 0.26 - 0.50 ng/mL               AND        <80% decrease in PCT             Encourage initiation of                                             antibiotics       Encourage continuation           of antibiotics    ----------------------------     -----------------------------        PCT >= 0.50 ng/mL                  PCT > 0.50 ng/mL               AND         increase in PCT                  Strongly encourage                                      initiation of antibiotics    Strongly encourage escalation           of antibiotics                                     -----------------------------                                           PCT <= 0.25 ng/mL  OR                                        > 80% decrease in PCT                                      Discontinue / Do not initiate                                             antibiotics  Performed at [facility], [place] [address] [place], [place] [address]   Blood gas, arterial     Status: Abnormal   Collection Time: 05/26/21  6:00 AM  Result Value Ref Range   FIO2 30.00    pH, Arterial 7.336 (L) 7.350 - 7.450   pCO2 arterial 54.1 (H) 32.0 - 48.0 mmHg   pO2, Arterial 68.4 (L) 83.0 - 108.0 mmHg   Bicarbonate 28.2 (H)  20.0 - 28.0 mmol/L   Acid-Base Excess 2.9 (H) 0.0 - 2.0 mmol/L   O2 Saturation 92.6 %   Patient temperature 36.7    Collection site RIGHT RADIAL    Drawn by [phone]    Sample type ARTERIAL    Allens test (pass/fail) POSITIVE (A) PASS    Comment: Performed at [facility], [place] [address] [place], [place] [address]  Magnesium     Status: None   Collection Time: 05/27/21 10:00 AM  Result Value Ref Range   Magnesium 2.0 1.7 - 2.4 mg/dL    Comment: Performed at [facility] [address] [place], [place] [address]  Comprehensive metabolic panel     Status: Abnormal   Collection Time: 05/27/21 10:00 AM  Result Value Ref Range   Sodium 136 135 - 145 mmol/L   Potassium 4.3 3.5 - 5.1 mmol/L   Chloride 100 98 - 111 mmol/L   CO2 30 22 - 32 mmol/L   Glucose, Bld 97 70 - 99 mg/dL    Comment: Glucose reference range applies only to samples taken after fasting for at least 8 hours.   BUN 9 6 - 20 mg/dL   Creatinine, Ser 0.76 0.44 - 1.00 mg/dL   Calcium 8.8 (L) 8.9 - 10.3 mg/dL   Total Protein 7.4 6.5 - 8.1 g/dL   Albumin 2.8 (L) 3.5 - 5.0 g/dL   AST 16 15 - 41 U/L   ALT 13 0 - 44 U/L   Alkaline Phosphatase 59 38 - 126 U/L   Total Bilirubin 0.6 0.3 - 1.2 mg/dL   GFR, Estimated >60 >60 mL/min    Comment: (NOTE) Calculated using the CKD-EPI Creatinine Equation (2021)    Anion gap 6 5 - 15    Comment: Performed at [facility], [place] [address] [place], [place] [address]  C-reactive protein     Status: Abnormal   Collection Time: 05/27/21 10:00 AM  Result Value Ref Range   CRP 6.0 (H) <1.0 mg/dL    Comment: Performed at [facility] [address] [place], [place] [address]  CBC with Differential/Platelet     Status: Abnormal   Collection Time: 05/27/21 10:00 AM  Result Value Ref Range   WBC  8.4 4.0 - 10.5 K/uL   RBC 5.38 (H) 3.87 - 5.11 MIL/uL   Hemoglobin 10.3 (L) 12.0 - 15.0 g/dL   HCT 36.8 36.0 - 46.0 %   MCV 68.4 (L) 80.0 - 100.0 fL   MCH 19.1 (L) 26.0  - 34.0 pg   MCHC 28.0 (L) 30.0 - 36.0 g/dL   RDW 23.2 (H) 11.5 - 15.5 %   Platelets 331 150 - 400 K/uL    Comment: REPEATED TO VERIFY   nRBC 0.0 0.0 - 0.2 %   Neutrophils Relative % 75 %   Neutro Abs 6.3 1.7 - 7.7 K/uL   Lymphocytes Relative 19 %   Lymphs Abs 1.6 0.7 - 4.0 K/uL   Monocytes Relative 5 %   Monocytes Absolute 0.5 0.1 - 1.0 K/uL   Eosinophils Relative 1 %   Eosinophils Absolute 0.0 0.0 - 0.5 K/uL   Basophils Relative 0 %   Basophils Absolute 0.0 0.0 - 0.1 K/uL   WBC Morphology MORPHOLOGY UNREMARKABLE    RBC Morphology MORPHOLOGY UNREMARKABLE    Smear Review MORPHOLOGY UNREMARKABLE    Immature Granulocytes 0 %   Abs Immature Granulocytes 0.02 0.00 - 0.07 K/uL    Comment: Performed at [facility], [address] [place], [place] [address]  Brain natriuretic peptide     Status: None   Collection Time: 05/27/21 10:00 AM  Result Value Ref Range   B Natriuretic Peptide 27.3 0.0 - 100.0 pg/mL    Comment: Performed at [facility] [address] [place], [place] [address]    Current Facility-Administered Medications  Medication Dose Route Frequency Provider Last Rate Last Admin   acetaminophen (TYLENOL) tablet 650 mg  650 mg Oral Q6H PRN Shalhoub, Sherryll Burger, MD       Or   acetaminophen (TYLENOL) suppository 650 mg  650 mg Rectal Q6H PRN Shalhoub, Sherryll Burger, MD       alum & mag hydroxide-simeth (MAALOX/MYLANTA) 200-200-20 MG/5ML suspension 30 mL  30 mL Oral Q8H PRN Thurnell Lose, MD   30 mL at 05/26/21 1816   ARIPiprazole (ABILIFY) tablet 10 mg  10 mg Oral Daily Shalhoub, Sherryll Burger, MD   10 mg at 05/26/21 0939   asenapine (SAPHRIS) sublingual tablet 10 mg  10 mg Sublingual BID PRN Harjot Dibello, MD       chlorhexidine (PERIDEX) 0.12 % solution 15 mL  15 mL Mouth Rinse BID Thurnell Lose, MD       Chlorhexidine Gluconate Cloth 2 % PADS 6 each  6 each Topical Daily Thurnell Lose, MD   6 each at 05/27/21 1159   enoxaparin (LOVENOX) injection 40 mg   40 mg Subcutaneous Daily Shalhoub, Sherryll Burger, MD   40 mg at 05/27/21 1154   FLUoxetine (PROZAC) capsule 20 mg  20 mg Oral Daily Shalhoub, Sherryll Burger, MD   20 mg at 05/26/21 0939   furosemide (LASIX) injection 60 mg  60 mg Intravenous BID Thurnell Lose, MD   60 mg at 05/27/21 1152   haloperidol lactate (HALDOL) injection 10 mg  10 mg Intramuscular Q6H PRN Chotiner, Yevonne Aline, MD   10 mg at 05/26/21 2330   hydrALAZINE (APRESOLINE) injection 10 mg  10 mg Intravenous Q6H PRN Shalhoub, Sherryll Burger, MD       hydrOXYzine (ATARAX) tablet 25 mg  25 mg Oral Q6H PRN Shalhoub, Sherryll Burger, MD       ipratropium-albuterol (DUONEB) 0.5-2.5 (3) MG/3ML nebulizer solution 3  mL  3 mL Nebulization Q4H PRN Shalhoub, Sherryll Burger, MD       lisinopril (ZESTRIL) tablet 10 mg  10 mg Oral Daily Shalhoub, Sherryll Burger, MD   10 mg at 05/26/21 [id number]   MEDLINE mouth rinse  15 mL Mouth Rinse q12n4p Thurnell Lose, MD       melatonin tablet 5 mg  5 mg Oral QHS PRN Thurnell Lose, MD       ondansetron [facility]) tablet 4 mg  4 mg Oral Q6H PRN Vernelle Emerald, MD   4 mg at 05/26/21 2118   Or   ondansetron (ZOFRAN) injection 4 mg  4 mg Intravenous Q6H PRN Shalhoub, Sherryll Burger, MD       polyethylene glycol (MIRALAX / GLYCOLAX) packet 17 g  17 g Oral Daily PRN Shalhoub, Sherryll Burger, MD       sodium chloride flush (NS) 0.9 % injection 10-40 mL  10-40 mL Intracatheter Q12H Thurnell Lose, MD   10 mL at 05/27/21 1200   sodium chloride flush (NS) 0.9 % injection 10-40 mL  10-40 mL Intracatheter PRN Thurnell Lose, MD        Musculoskeletal: Strength & Muscle Tone: uncooperative with assessment. Gait & Station: uncooperative with assessment. Patient leans: N/A     Psychiatric Specialty Exam:  Presentation  General Appearance: Appropriate for Environment  Eye Contact:Minimal  Speech:Normal Rate  Speech Volume:Increased  Handedness:Right   Mood and Affect  Mood:Irritable  Affect:Labile   Thought Process  Thought  Processes:Disorganized  Descriptions of Associations:Tangential  Orientation:Other (comment) (uncooperative with assessment)  Thought Content:Tangential  History of Schizophrenia/Schizoaffective disorder:Yes  Duration of Psychotic Symptoms:Greater than six months  Hallucinations:Hallucinations: -- (uncooperative with assessment)  Ideas of Reference:None  Suicidal Thoughts:Suicidal Thoughts: No  Homicidal Thoughts:Homicidal Thoughts: No   Sensorium  Memory:-- (uncooperative with assessment)  Judgment:Poor  Insight:Lacking   Executive Functions  Concentration:Poor  Attention Span:Poor  Recall:-- (uncooperative with assessment)  Fund of Knowledge:-- (uncooperative with assessment)  Language:Fair   Psychomotor Activity  Psychomotor Activity:Psychomotor Activity: Increased   Assets  Assets:-- (uncooperative with assessment)   Sleep  Sleep:Sleep: Fair   Physical Exam: Physical Exam Review of Systems  Psychiatric/Behavioral:  The patient has insomnia.   Blood pressure 129/73, pulse 77, temperature 98.4 F (36.9 C), temperature source Axillary, resp. rate 16, height 5\' 5"  (1.651 m), SpO2 99 %. Body mass index is 45.76 kg/m.  Treatment Plan Summary: 46 year old female with long history of mental illness, multiple medical issues who is non-compliant with psychiatric medications. Psychiatric consult was initiated for psychosis and capacity determination. However, patient is belligerent and uncooperative with full evaluation.  Base on her current behavior, she does not appear to have Capacity to make informed medical consent. Also, she will benefit from psychiatric medication management and optimization of medical treatment.  Recommendations: -Continue 1:1 safety sitter -Continue IVC commitment-patient appears to be a danger to self and others -Continue Abilify 10 mg daily for psychosis/mood  -Discontinue Prozac 20 mg daily, patient still agitated -Consider  adding Depakote DR 500 mg PO twice daily for Bipolar. -Consider IV Depakote 500 mg twice daily if patient refuse PO -Add Asenapine 10 mg S/L twice daily as needed for agitation and psychosis -Consider Haldol 10 mg IM plus Benadryl 25 mg IM every 12 hours as needed for agitation/psychosis if patient refused po Asenapine - 12 leads EKG may be warranted to monitor QTc interval if patient takes psychotropic daily   Disposition: - -Patient  meet criteria for inpatient psychiatric admission but needs to be medically stabilized -TOC CM/SW Will seek for inpt psych placement  Corena Pilgrim, MD 05/27/2021 12:18 PM

## 2021-05-27 NOTE — Progress Notes (Signed)
Pt refused vital signs to be taken, stating "I don't want to do it"

## 2021-05-27 NOTE — Social Work (Signed)
CSW was alerted by MD that pt will need an inpt psych placement tomorrow. CSW added Jamaral form [facility] into the chat in order to receive referral for pt.

## 2021-05-27 NOTE — TOC Progression Note (Signed)
Transition of Care [facility]) - Progression Note    Patient Details  Name: Breanna Moore MRN: [id number] Date of Birth: 1975-05-31  Transition of Care [facility]) CM/SW Contact  Patrice Paradise, LCSW Phone Number: [phone] 05/27/2021, 3:11 PM  Clinical Narrative:     CSW attempted to call [facility] to make referral due to not getting response with epic chat. CSW was unable to leave voicemail due to it being full.  TOC team will continue to assist with discharge planning needs.       Expected Discharge Plan and Services                                                 Social Determinants of Health (SDOH) Interventions    Readmission Risk Interventions No flowsheet data found.

## 2021-05-27 NOTE — Progress Notes (Signed)
+  Pt  is pacing around, refused vital signs

## 2021-05-27 NOTE — Progress Notes (Signed)

## 2021-05-28 DIAGNOSIS — J9602 Acute respiratory failure with hypercapnia: Secondary | ICD-10-CM | POA: Diagnosis not present

## 2021-05-28 LAB — COMPREHENSIVE METABOLIC PANEL
ALT: 13 U/L (ref 0–44)
AST: 14 U/L — ABNORMAL LOW (ref 15–41)
Albumin: 2.6 g/dL — ABNORMAL LOW (ref 3.5–5.0)
Alkaline Phosphatase: 64 U/L (ref 38–126)
Anion gap: 6 (ref 5–15)
BUN: 12 mg/dL (ref 6–20)
CO2: 34 mmol/L — ABNORMAL HIGH (ref 22–32)
Calcium: 8.7 mg/dL — ABNORMAL LOW (ref 8.9–10.3)
Chloride: 98 mmol/L (ref 98–111)
Creatinine, Ser: 0.88 mg/dL (ref 0.44–1.00)
GFR, Estimated: >60 mL/min (ref >60)
Glucose, Bld: 124 mg/dL — ABNORMAL HIGH (ref 70–99)
Potassium: 4 mmol/L (ref 3.5–5.1)
Sodium: 138 mmol/L (ref 135–145)
Total Bilirubin: 0.4 mg/dL (ref 0.3–1.2)
Total Protein: 7.3 g/dL (ref 6.5–8.1)

## 2021-05-28 LAB — CBC WITH DIFFERENTIAL/PLATELET
Abs Immature Granulocytes: 0.03 10*3/uL (ref 0.00–0.07)
Basophils Absolute: 0 10*3/uL (ref 0.0–0.1)
Basophils Relative: 0 %
Eosinophils Absolute: 0.1 10*3/uL (ref 0.0–0.5)
Eosinophils Relative: 2 %
HCT: 36.5 % (ref 36.0–46.0)
Hemoglobin: 9.9 g/dL — ABNORMAL LOW (ref 12.0–15.0)
Immature Granulocytes: 0 %
Lymphocytes Relative: 23 %
Lymphs Abs: 1.9 10*3/uL (ref 0.7–4.0)
MCH: 18.9 pg — ABNORMAL LOW (ref 26.0–34.0)
MCHC: 27.1 g/dL — ABNORMAL LOW (ref 30.0–36.0)
MCV: 69.5 fL — ABNORMAL LOW (ref 80.0–100.0)
Monocytes Absolute: 0.6 10*3/uL (ref 0.1–1.0)
Monocytes Relative: 7 %
Neutro Abs: 5.6 10*3/uL (ref 1.7–7.7)
Neutrophils Relative %: 68 %
Platelets: 336 10*3/uL (ref 150–400)
RBC: 5.25 MIL/uL — ABNORMAL HIGH (ref 3.87–5.11)
RDW: 23.2 % — ABNORMAL HIGH (ref 11.5–15.5)
WBC: 8.4 10*3/uL (ref 4.0–10.5)
nRBC: 0 % (ref 0.0–0.2)

## 2021-05-28 LAB — BRAIN NATRIURETIC PEPTIDE: B Natriuretic Peptide: 19.7 pg/mL (ref 0.0–100.0)

## 2021-05-28 LAB — C-REACTIVE PROTEIN: CRP: 6.7 mg/dL — ABNORMAL HIGH (ref <1.0)

## 2021-05-28 LAB — MAGNESIUM: Magnesium: 1.7 mg/dL (ref 1.7–2.4)

## 2021-05-28 MED ORDER — FUROSEMIDE 10 MG/ML IJ SOLN
60.0000 mg | Freq: Once | INTRAMUSCULAR | Status: AC
  Administered 2021-05-28: 60 mg via INTRAVENOUS
  Filled 2021-05-28: qty 6

## 2021-05-28 MED ORDER — FLUTICASONE PROPIONATE 50 MCG/ACT NA SUSP
2.0000 | Freq: Every day | NASAL | Status: DC
  Administered 2021-05-29 – 2021-05-31 (×3): 2 via NASAL
  Filled 2021-05-28: qty 16

## 2021-05-28 NOTE — Progress Notes (Signed)
PROGRESS NOTE                                                                                                                                                                                                             Patient Demographics:    Breanna Moore, is a 46 y.o. female, DOB - [birth year], [id number]  Outpatient Primary MD for the patient is Gerald Leitz    LOS - 2  Admit date - 05/25/2021    Chief Complaint  Patient presents with   Psychiatric Evaluation       Brief Narrative (HPI from H&P)   46 year old female with past medical history of schizoaffective disorder, hypertension, morbid obesity, obesity hypoventilation syndrome (has followed with Dr. Camillo Flaming with [facility] in the past), restrictive lung disease, severe obstructive sleep apnea (PSG 10/2019 AHI 63) who presented to [facility] emergency department via EMS with complaints of chest and abdominal pain, apparently she has stopped taking her psych medications at home for a while and has not been using her BiPAP as she has lost the machine, in the ER she exhibited highly erratic behavior, she took her clothes off and was pacing in the ER, she was aggressive to the staff, she subsequently received Geodon and then developed hypercapnic respiratory failure.   Subjective:   Patient in bed, appears comfortable, denies any headache, no fever, no chest pain or pressure, no shortness of breath , no abdominal pain. No new focal weakness.   Assessment  & Plan :    * Acute respiratory failure with hypercapnia (HCC)- (present on admission) Due to combination of acute on chronic diastolic CHF, receiving Geodon in the ER, underlying OSA and OHS and noncompliance with BiPAP with severe toxic encephalopathy.  She has responded well to Lasix with diuresis, along with BiPAP.  Encephalopathy has resolved.  Continue diuresis with Lasix, patient  currently agreeable to taking the medicine.  We will continue nighttime BiPAP.  Medically much improved and close to her baseline.        Schizoaffective disorder, bipolar type ([place])- (present on admission) Per patient's mother at the time of admission patient has been exhibiting highly erratic behavior since July, she has been noncompliant with her psych medications and other medications.  She is exhibiting the same behavior in the hospital psych frequently refused medications,  seen by psych placed on Depakote, ARIPiprazole somewhat improved, she will definitely benefit from [facility] placement.  Obesity hypoventilation syndrome ([place])- (present on admission) Longstanding known history of obesity hypoventilation syndrome with patient having followed with Dr. Camillo Flaming [facility], BiPAP nightly, weight loss with PCP thereafter outpatient follow-up with her pulmonologist postdischarge   OSA (obstructive sleep apnea)- (present on admission) See #1 above  Essential hypertension- (present on admission) Resume patients home regimen of oral antihypertensives Titrate antihypertensive regimen as necessary to achieve adequate BP control PRN intravenous antihypertensives for excessively elevated blood pressure    Acute on chronic diastolic CHF (congestive heart failure) ([place])- (present on admission) Please see assessment and plan above  BMI 60.0-69.9, adult ([place]) With underlying OSA, OHS and noncompliance with BiPAP.  Counseled on compliance.  Follow with PCP on weight loss            Condition - Extremely Guarded  Family Communication  :  called Mother miss Santiago Glad [phone] on 05/26/2021 at 11:49 AM and message left, 05/28/2021 at 10:07 AM  Code Status :  Full  Consults  :  Psych  PUD Prophylaxis :    Procedures  :            Disposition Plan  :    Status is: Inpatient   DVT Prophylaxis  :    enoxaparin (LOVENOX) injection 40 mg Start: 05/26/21 1000    Lab  Results  Component Value Date   PLT 336 05/28/2021    Diet :  Diet Order             DIET DYS 3 Room service appropriate? No; Fluid consistency: Thin  Diet effective now                    Inpatient Medications  Scheduled Meds:  ARIPiprazole  10 mg Oral Daily   chlorhexidine  15 mL Mouth Rinse BID   Chlorhexidine Gluconate Cloth  6 each Topical Daily   divalproex  500 mg Oral Q12H   enoxaparin (LOVENOX) injection  40 mg Subcutaneous Daily   fluticasone  2 spray Each Nare Daily   furosemide  60 mg Intravenous Once   lisinopril  10 mg Oral Daily   mouth rinse  15 mL Mouth Rinse q12n4p   sodium chloride flush  10-40 mL Intracatheter Q12H   Continuous Infusions: PRN Meds:.acetaminophen **OR** acetaminophen, alum & mag hydroxide-simeth, asenapine, haloperidol lactate, hydrALAZINE, hydrOXYzine, ipratropium-albuterol, melatonin, ondansetron **OR** ondansetron (ZOFRAN) IV, polyethylene glycol, sodium chloride flush  Antibiotics  :    Anti-infectives (From admission, onward)    None        Time Spent in minutes  30   Lala Lund M.D on 05/28/2021 at 10:11 AM  To page go to [email or web address]   [facility] -  Office  [phone]  See all Orders from today for further details    Objective:   Vitals:   05/27/21 1757 05/27/21 1940 05/27/21 2345 05/28/21 0515  BP: 137/81 110/71 130/83 110/75  Pulse: 87  92 95  Resp: 18     Temp: 98.4 F (36.9 C) 98.3 F (36.8 C) 98.2 F (36.8 C) 97.6 F (36.4 C)  TempSrc: Oral Oral Oral Oral  SpO2: 100% 98% 97% 96%  Height:        Wt Readings from Last 3 Encounters:  04/19/21 124.7 kg  03/15/21 (!) 147.4 kg  01/22/21 (!) 174.2 kg     Intake/Output Summary (Last 24 hours) at  05/28/2021 1011 Last data filed at 05/28/2021 0559 Gross per 24 hour  Intake 450 ml  Output 1700 ml  Net -1250 ml     Physical Exam  Awake Alert, No new F.N deficits, Normal affect [place].AT,PERRAL Supple Neck, No JVD,   Symmetrical  Chest wall movement, Good air movement bilaterally, CTAB RRR,No Gallops, Rubs or new Murmurs,  +ve B.Sounds, Abd Soft, No tenderness,   No Cyanosis, Clubbing or edema       Data Review:    CBC Recent Labs  Lab 05/21/21 1408 05/24/21 1942 05/25/21 1136 05/25/21 2211 05/25/21 2240 05/26/21 0252 05/26/21 0443 05/27/21 1000 05/28/21 0318  WBC 8.0   < > 8.5  --  9.8  --  8.0 8.4 8.4  HGB 11.4*   < > 12.1   < > 10.9* 13.3 10.8* 10.3* 9.9*  HCT 41.6   < > 42.2   < > 40.0 39.0 39.3 36.8 36.5  PLT 388   < > 363  --  360  --  266 331 336  MCV 69.3*   < > 67.1*  --  68.4*  --  68.9* 68.4* 69.5*  MCH 19.0*   < > 19.2*  --  18.6*  --  18.9* 19.1* 18.9*  MCHC 27.4*   < > 28.7*  --  27.3*  --  27.5* 28.0* 27.1*  RDW 24.0*   < > 23.4*  --  23.4*  --  23.7* 23.2* 23.2*  LYMPHSABS 2.3  --   --   --  2.1  --  1.1 1.6 1.9  MONOABS 0.5  --   --   --  0.6  --  0.6 0.5 0.6  EOSABS 0.1  --   --   --  0.0  --  0.0 0.0 0.1  BASOSABS 0.0  --   --   --  0.0  --  0.0 0.0 0.0   < > = values in this interval not displayed.    Electrolytes Recent Labs  Lab 05/25/21 1136 05/25/21 2211 05/25/21 2240 05/26/21 0252 05/26/21 0443 05/27/21 1000 05/28/21 0318  NA 135   < > 137 141 140 136 138  K 3.6   < > 3.7 4.2 4.6 4.3 4.0  CL 101  --  102  --  104 100 98  CO2 26  --  24  --  27 30 34*  GLUCOSE 93  --  89  --  77 97 124*  BUN 7  --  6  --  6 9 12   CREATININE 0.75  --  0.72  --  0.73 0.76 0.88  CALCIUM 9.3  --  9.1  --  8.9 8.8* 8.7*  AST 14*  --  17  --  21 16 14*  ALT 14  --  14  --  15 13 13   ALKPHOS 76  --  71  --  68 59 64  BILITOT 0.6  --  0.7  --  1.0 0.6 0.4  ALBUMIN 3.4*  --  3.1*  --  2.8* 2.8* 2.6*  MG  --   --   --   --  1.9 2.0 1.7  CRP  --   --   --   --  6.1* 6.0* 6.7*  PROCALCITON  --   --   --   --  <0.10  --   --   BNP  --   --   --   --  52.7 27.3 19.7   < > =  values in this interval not displayed.     ------------------------------------------------------------------------------------------------------------------ No results for input(s): CHOL, HDL, LDLCALC, TRIG, CHOLHDL, LDLDIRECT in the last 72 hours.  No results found for: HGBA1C  No results for input(s): TSH, T4TOTAL, T3FREE, THYROIDAB in the last 72 hours.  Invalid input(s): FREET3 ------------------------------------------------------------------------------------------------------------------ ID Labs Recent Labs  Lab 05/25/21 1136 05/25/21 2240 05/26/21 0443 05/27/21 1000 05/28/21 0318  WBC 8.5 9.8 8.0 8.4 8.4  PLT 363 360 266 331 336  CRP  --   --  6.1* 6.0* 6.7*  PROCALCITON  --   --  <0.10  --   --   CREATININE 0.75 0.72 0.73 0.76 0.88   Cardiac Enzymes No results for input(s): CKMB, TROPONINI, MYOGLOBIN in the last 168 hours.  Invalid input(s): CK   Radiology Reports DG Chest 2 View  Result Date: 05/24/2021 CLINICAL DATA:  Chest pain EXAM: CHEST - 2 VIEW COMPARISON:  03/15/2021 FINDINGS: Transverse diameter of heart is increased. Central pulmonary vessels are prominent. There is interval improvement in aeration of right lower lung fields. There is faint haziness in the right lower lung fields. Costophrenic angles are clear. There is no pneumothorax. There is extrinsic pressure over the left lateral margin of trachea in the lower neck suggesting enlarged thyroid. IMPRESSION: Cardiomegaly. Central pulmonary vessels are prominent suggesting CHF. Faint haziness in the right lower lung fields may suggest asymmetric pulmonary edema. There is no focal pulmonary consolidation. There is no pleural effusion. Electronically Signed   By: Elmer Picker M.D.   On: 05/24/2021 20:10   DG Chest Port 1 View  Result Date: 05/26/2021 CLINICAL DATA:  Shortness of breath EXAM: PORTABLE CHEST 1 VIEW COMPARISON:  Yesterday FINDINGS: Cardiomegaly and vascular pedicle widening with diffuse congested appearance of vessels. No  Kerley lines, effusion, or pneumothorax seen. Rightward tracheal deviation known from 2021 CT and thyroidal. IMPRESSION: Cardiomegaly and vascular congestion. Electronically Signed   By: Jorje Guild M.D.   On: 05/26/2021 06:40   DG Chest Port 1 View  Result Date: 05/26/2021 CLINICAL DATA:  Shortness of breath EXAM: PORTABLE CHEST 1 VIEW COMPARISON:  05/24/2021 FINDINGS: Cardiac shadow is enlarged but stable. Increased central vascular congestion is again seen and worsened in the interval. Patchy atelectatic changes are noted in the right base. IMPRESSION: Increasing vascular congestion with right basilar atelectasis. Electronically Signed   By: Inez Catalina M.D.   On: 05/26/2021 00:18

## 2021-05-28 NOTE — Progress Notes (Signed)
Chaplain Benetta Spar responded to Spiritual Care consultation request for Bible and Prayer. Chaplain went to patient room to meet with patient. Upon entering room Chaplain was informed by Ophelia Charter that patient was sleeping. A Bible was left on the patient stand next to the bed.

## 2021-05-28 NOTE — Progress Notes (Signed)
Mobility Specialist Progress Note    05/28/21 1215  Mobility  Bed Position Chair  Activity Ambulated with assistance in hallway  Level of Assistance Contact guard assist, steadying assist  Assistive Device Front wheel walker  Distance Ambulated (ft) 130 ft  Activity Response Tolerated fair  $Mobility charge 1 Mobility   Pt received in BR with NT and agreeable. Ambulated on 3LO2. Took x1 seated rest break. Returned to chair in room with NT present.   [facility] Mobility Specialist  M.S. 2C and 6E: [phone] M.S. 4E: [phone]

## 2021-05-28 NOTE — Progress Notes (Signed)
°   05/28/21 0900  Clinical Encounter Type  Visited With Health care provider  Visit Type Initial  Referral From Nurse  Consult/Referral To Chaplain  Spiritual Encounters  Spiritual Needs Other (Comment) (Bible Requested)
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
## 2021-05-28 NOTE — TOC Progression Note (Addendum)
Transition of Care [facility]) - Progression Note    Patient Details  Name: Breanna Moore MRN: [id number] Date of Birth: [birth year]  [facility] CM/SW Contact  Delilah Shan, LCSWA Phone Number: 05/28/2021, 10:59 AM  Clinical Narrative:     CSW was informed by MD that patient is medically cleared. CSW made referral to [facility] for inpatient psych. Teneka with [facility]  informed CSW Per Intake, there is currently no beds at [facility] available. CSW faxed out referral to [place], [facility] [place],[facility],[place],and [place]. Patients mother has been updated.No current inpatient psych bed availability today. CSW informed MD and RN.CSW will continue to follow and assist with patients dc planning needs.       Expected Discharge Plan and Services                                                 Social Determinants of Health (SDOH) Interventions    Readmission Risk Interventions No flowsheet data found.

## 2021-05-28 NOTE — Progress Notes (Signed)
°   05/28/21 1500  Clinical Encounter Type  Visited With Patient;Health care provider  Visit Type Follow-up;Spiritual support  Referral From Nurse  Consult/Referral To [place] text;Prayer  Companion Sitter  Companion [place]   (Present)   Chaplain Breanna Moore met with patient regarding Spiritual Consult request for Prayer. A Bible had been left in the room earlier today per her request. Chaplain asked open-ended questions in hopes of gaining insight of patient's belief system. Patient states she believes in God, The Son (who is God's begotten Son). When asked more direct questions regarding the Triune God; as God, Son, and [facility], patient acknowledged all three. Patient states she speaks to God, and to "Jesus Christo" stating that she uses the Spanish name for Christ.   At Patient's request, the Patient Breanna Moore had placed the Bible that the Patient requested in the consult  outside of room in hall. During the visit, the patient requested that the Bible be brought back into the room, and then placed back outside the room on the handrail on two separate occasions. Patient stated that the Bible brought for was "Too powerful and that she could not handle having it in the room." Patient requested Prayer, but then did not allow for it.   Patient was uncomfortable sitting in the chair, and asked sitter to take her for a walk at which time I excused myself. Chaplain is available for consultation upon request at [phone].

## 2021-05-28 NOTE — Progress Notes (Signed)
°   05/28/21 0900  Clinical Encounter Type  Visited With Health care provider  Visit Type Initial  Referral From Nurse  Consult/Referral To Chaplain  Spiritual Encounters  Spiritual Needs Other (Comment) (Bible Requested)   Chaplain Benetta Spar responded to Spiritual Care consultation request for Bible and Prayer. Chaplain went to patient room to meet with patient. Upon entering room Chaplain was informed by Ophelia Charter that patient was sleeping. A Bible was left on the patient stand next to the bed.
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
## 2021-05-29 LAB — CBC WITH DIFFERENTIAL/PLATELET
Abs Immature Granulocytes: 0.02 10*3/uL (ref 0.00–0.07)
Basophils Absolute: 0 10*3/uL (ref 0.0–0.1)
Basophils Relative: 0 %
Eosinophils Absolute: 0.1 10*3/uL (ref 0.0–0.5)
Eosinophils Relative: 2 %
HCT: 36.8 % (ref 36.0–46.0)
Hemoglobin: 10.3 g/dL — ABNORMAL LOW (ref 12.0–15.0)
Immature Granulocytes: 0 %
Lymphocytes Relative: 23 %
Lymphs Abs: 1.5 10*3/uL (ref 0.7–4.0)
MCH: 19.3 pg — ABNORMAL LOW (ref 26.0–34.0)
MCHC: 28 g/dL — ABNORMAL LOW (ref 30.0–36.0)
MCV: 68.9 fL — ABNORMAL LOW (ref 80.0–100.0)
Monocytes Absolute: 0.5 10*3/uL (ref 0.1–1.0)
Monocytes Relative: 7 %
Neutro Abs: 4.5 10*3/uL (ref 1.7–7.7)
Neutrophils Relative %: 68 %
Platelets: 325 10*3/uL (ref 150–400)
RBC: 5.34 MIL/uL — ABNORMAL HIGH (ref 3.87–5.11)
RDW: 22.8 % — ABNORMAL HIGH (ref 11.5–15.5)
WBC: 6.7 10*3/uL (ref 4.0–10.5)
nRBC: 0 % (ref 0.0–0.2)

## 2021-05-29 LAB — BASIC METABOLIC PANEL
Anion gap: 8 (ref 5–15)
BUN: 12 mg/dL (ref 6–20)
CO2: 33 mmol/L — ABNORMAL HIGH (ref 22–32)
Calcium: 8.8 mg/dL — ABNORMAL LOW (ref 8.9–10.3)
Chloride: 96 mmol/L — ABNORMAL LOW (ref 98–111)
Creatinine, Ser: 0.67 mg/dL (ref 0.44–1.00)
GFR, Estimated: >60 mL/min (ref >60)
Glucose, Bld: 113 mg/dL — ABNORMAL HIGH (ref 70–99)
Potassium: 4.2 mmol/L (ref 3.5–5.1)
Sodium: 137 mmol/L (ref 135–145)

## 2021-05-29 LAB — BRAIN NATRIURETIC PEPTIDE: B Natriuretic Peptide: 17.5 pg/mL (ref 0.0–100.0)

## 2021-05-29 LAB — C-REACTIVE PROTEIN: CRP: 6 mg/dL — ABNORMAL HIGH (ref <1.0)

## 2021-05-29 LAB — MAGNESIUM: Magnesium: 2.3 mg/dL (ref 1.7–2.4)

## 2021-05-29 MED ORDER — ASENAPINE MALEATE 5 MG SL SUBL: 10.0000 mg | SUBLINGUAL_TABLET | Freq: Two times a day (BID) | SUBLINGUAL | Status: DC | PRN

## 2021-05-29 MED ORDER — MAGNESIUM SULFATE IN D5W 1-5 GM/100ML-% IV SOLN
1.0000 g | Freq: Once | INTRAVENOUS | Status: AC
  Administered 2021-05-29: 1 g via INTRAVENOUS
  Filled 2021-05-29: qty 100

## 2021-05-29 MED ORDER — SODIUM CHLORIDE 0.9 % IV SOLN: INTRAVENOUS | Status: DC | PRN

## 2021-05-29 MED ORDER — DIVALPROEX SODIUM 500 MG PO DR TAB: 500.0000 mg | DELAYED_RELEASE_TABLET | Freq: Two times a day (BID) | ORAL | Status: DC

## 2021-05-29 MED ORDER — FUROSEMIDE 10 MG/ML IJ SOLN
60.0000 mg | Freq: Once | INTRAMUSCULAR | Status: AC
  Administered 2021-05-29: 60 mg via INTRAVENOUS
  Filled 2021-05-29: qty 6

## 2021-05-29 MED ORDER — FUROSEMIDE 40 MG PO TABS: 40.0000 mg | ORAL_TABLET | Freq: Two times a day (BID) | ORAL | 0 refills | Status: DC

## 2021-05-29 NOTE — [place] Notes (Signed)
Pt refused the lab draw stating "I have told them, I don't anyone to take my lbood"

## 2021-05-29 NOTE — Discharge Summary (Addendum)
Breanna Moore [id number] DOB: 11-29-1975 DOA: 05/25/2021  PCP: Andria Frames, PA-C  Admit date: 05/25/2021  Discharge date: 05/31/2021  Admitted From: Home   Disposition:  Home   Recommendations for Outpatient Follow-up:   Follow up with PCP in 1-2 weeks  PCP Please obtain BMP/CBC, 2 view CXR in 1week,  (see Discharge instructions)   PCP Please follow up on the following pending results:    Home Health: None   Equipment/Devices: None  Consultations: Psych Discharge Condition: Stable    CODE STATUS: Full    Diet Recommendation: Dysphagia 3 diet with strict 1.5 L fluid restriction per day  Diet Order             DIET DYS 3 Room service appropriate? No; Fluid consistency: Thin  Diet effective now                    Chief Complaint  Patient presents with   Psychiatric Evaluation     Brief history of present illness from the day of admission and additional interim summary    46 year old female with past medical history of schizoaffective disorder, hypertension, morbid obesity, obesity hypoventilation syndrome (has followed with Dr. Camillo Flaming with [facility] Pulmonology in the past), restrictive lung disease, severe obstructive sleep apnea (PSG 10/2019 AHI 63) who presented to [facility] emergency department via EMS with complaints of chest and abdominal pain, apparently she has stopped taking her psych medications at home for a while and has not been using her BiPAP as she has lost the machine, in the ER she exhibited highly erratic behavior, she took her clothes off and was pacing in the ER, she was aggressive to the staff, she subsequently received Geodon and then developed hypercapnic respiratory failure.                                                                 Hospital Course      Acute respiratory failure with hypercapnia ([place])- (present on admission) Due to combination of acute on chronic diastolic CHF, receiving Geodon in the ER, underlying OSA and OHS and noncompliance with BiPAP with severe toxic encephalopathy.   She has responded well to Lasix with diuresis, along with BiPAP.  Encephalopathy has resolved.  Continue diuresis with Lasix, patient currently agreeable to taking the medicine.  We will continue nighttime BiPAP.  Medically much improved and now close to her baseline.  Counseled on compliance with her medications.   Schizoaffective disorder, bipolar type ([place])- (present on admission) Per patient's mother at the time of admission patient has been exhibiting highly erratic behavior since July, she has been noncompliant with her psych medications and other medications.  She is exhibiting the same behavior in the hospital psych frequently refused medications, seen by psych placed on Depakote,  ARIPiprazole & Saphris somewhat improved, initially was to go to [place] but psych has not cleared her to go home.  Will be going home with home health and nursing if she qualifies.  Note if Saphris is hard to obtain that psych will adequately substitute and it will be ordered thereafter.   Obesity hypoventilation syndrome ([place])- (present on admission) Longstanding known history of obesity hypoventilation syndrome with patient having followed with Dr. Camillo Flaming with pulmonology at [facility], BiPAP nightly, weight loss with PCP thereafter outpatient follow-up with her pulmonologist postdischarge, social work is arranging for home BiPAP which she says she has lost her previous BiPAP machine.  Note called mother on her home and cell phone, last call was on 05/30/2021 on her cell phone message left.     OSA (obstructive sleep apnea)- (present on admission) See #1 above   Essential hypertension- (present on admission) Stable on diuretic and ACE inhibitor      Acute on chronic  diastolic CHF (congestive heart failure) ([place])- (present on admission) Please see assessment and plan above   BMI 60.0-69.9, adult ([place]) With underlying OSA, OHS and noncompliance with BiPAP.  Counseled on compliance.  Follow with PCP on weight loss   Discharge diagnosis     Principal Problem:   Acute respiratory failure with hypercapnia (HCC) Active Problems:   Schizoaffective disorder, bipolar type (HCC)   OSA (obstructive sleep apnea)   Obesity hypoventilation syndrome (HCC)   Essential hypertension   BMI 60.0-69.9, adult (HCC)   Acute on chronic diastolic CHF (congestive heart failure) [facility])    Discharge instructions    Discharge Instructions     Discharge instructions   Complete by: As directed    Follow with Primary MD Andria Frames, PA-C in 7 days   Get CBC, CMP, 2 view Chest X ray -  checked next visit within 1 week by PCP or [facility] MD    Activity: As tolerated with Full fall precautions use walker/cane & assistance as needed  Disposition [facility]  Diet: Dysphagia 3 diet with 1.5 L fluid restrictions per day, feeding assistance and aspiration precautions  Check your Weight same time everyday, if you gain over 2 pounds, or you develop in leg swelling, experience more shortness of breath or chest pain, call your Primary MD immediately. Follow Cardiac Low Salt Diet and 1.5 lit/day fluid restriction.  Special Instructions: If you have smoked or chewed Tobacco  in the last 2 yrs please stop smoking, stop any regular Alcohol  and or any Recreational drug use.  On your next visit with your primary care physician please Get Medicines reviewed and adjusted.  Please request your Prim.MD to go over all Hospital Tests and Procedure/Radiological results at the follow up, please get all Hospital records sent to your Prim MD by signing hospital release before you go home.  If you experience worsening of your admission symptoms, develop shortness of breath, life threatening  emergency, suicidal or homicidal thoughts you must seek medical attention immediately by calling 911 or calling your MD immediately  if symptoms less severe.  You Must read complete instructions/literature along with all the possible adverse reactions/side effects for all the Medicines you take and that have been prescribed to you. Take any new Medicines after you have completely understood and accpet all the possible adverse reactions/side effects.   Increase activity slowly   Complete by: As directed        Discharge Medications   Allergies as of 05/31/2021  Reactions   Geodon [ziprasidone Hcl] Other (See Comments)   Patient became hypoxic and required nonrebreather. Please avoid or try low dose    Hydrocodone-acetaminophen    Ketorolac    Paroxetine Hcl    Other reaction(s): Confusion   Penicillins Rash   Has patient had a PCN reaction causing immediate rash, facial/tongue/throat swelling, SOB or lightheadedness with hypotension: Yes Has patient had a PCN reaction causing severe rash involving mucus membranes or skin necrosis: No Has patient had a PCN reaction that required hospitalization: No Has patient had a PCN reaction occurring within the last 10 years: No If all of the above answers are "NO", then may proceed with Cephalosporin use.        Medication List     STOP taking these medications    FLUoxetine 20 MG capsule Commonly known as: PROZAC   hydrochlorothiazide 25 MG tablet Commonly known as: HYDRODIURIL       TAKE these medications    albuterol 108 (90 Base) MCG/ACT inhaler Commonly known as: VENTOLIN HFA Inhale 2 puffs into the lungs every 6 (six) hours as needed for shortness of breath.   ARIPiprazole 10 MG tablet Commonly known as: ABILIFY Take 1 tablet (10 mg total) by mouth daily.   asenapine 5 MG Subl 24 hr tablet Commonly known as: SAPHRIS Place 2 tablets (10 mg total) under the tongue 2 (two) times daily as needed (agitation).    divalproex 500 MG DR tablet Commonly known as: DEPAKOTE Take 1 tablet (500 mg total) by mouth every 12 (twelve) hours.   furosemide 40 MG tablet Commonly known as: LASIX Take 1 tablet (40 mg total) by mouth 2 (two) times daily. What changed: when to take this   hydrOXYzine 25 MG capsule Commonly known as: VISTARIL Take 25 mg by mouth every 6 (six) hours as needed for anxiety.   lisinopril 10 MG tablet Commonly known as: ZESTRIL Take 10 mg by mouth daily.         Follow-up Information     Park Meo T, PA-C. Schedule an appointment as soon as possible for a visit in 1 week(s).   Specialty: Physician Assistant Why: And your psychiatrist within a week of discharge Contact information: [place] [address] [phone]                 Major procedures and Radiology Reports - PLEASE review detailed and final reports thoroughly  -       DG Chest 2 View  Result Date: 05/24/2021 CLINICAL DATA:  Chest pain EXAM: CHEST - 2 VIEW COMPARISON:  03/15/2021 FINDINGS: Transverse diameter of heart is increased. Central pulmonary vessels are prominent. There is interval improvement in aeration of right lower lung fields. There is faint haziness in the right lower lung fields. Costophrenic angles are clear. There is no pneumothorax. There is extrinsic pressure over the left lateral margin of trachea in the lower neck suggesting enlarged thyroid. IMPRESSION: Cardiomegaly. Central pulmonary vessels are prominent suggesting CHF. Faint haziness in the right lower lung fields may suggest asymmetric pulmonary edema. There is no focal pulmonary consolidation. There is no pleural effusion. Electronically Signed   By: Elmer Picker M.D.   On: 05/24/2021 20:10   CT Head Wo Contrast  Result Date: 05/21/2021 CLINICAL DATA:  Head injury.  Headache.  Left arm weakness. EXAM: CT HEAD WITHOUT CONTRAST TECHNIQUE: Contiguous axial images were obtained from the base  of the skull through the vertex without intravenous contrast. RADIATION  DOSE REDUCTION: This exam was performed according to the departmental dose-optimization program which includes automated exposure control, adjustment of the mA and/or kV according to patient size and/or use of iterative reconstruction technique. COMPARISON:  Head CT 01/20/2021 and MRI 11/22/2019 FINDINGS: Brain: There is no evidence of an acute infarct, intracranial hemorrhage, mass, midline shift, or extra-axial fluid collection. The ventricles and sulci are normal. An enlarged, partially empty sella is unchanged. Hypodensities in the cerebral white matter bilaterally are similar to the prior CT and are nonspecific but may reflect premature chronic small vessel ischemia given vascular risk factors. Vascular: No hyperdense vessel. Skull: No fracture or suspicious osseous lesion. Sinuses/Orbits: Visualized paranasal sinuses and mastoid air cells are clear. Unremarkable orbits. Other: None. IMPRESSION: 1. No evidence of acute intracranial abnormality. 2. Chronic white matter disease and partially empty sella. Electronically Signed   By: Logan Bores M.D.   On: 05/21/2021 14:10   DG Chest Port 1 View  Result Date: 05/26/2021 CLINICAL DATA:  Shortness of breath EXAM: PORTABLE CHEST 1 VIEW COMPARISON:  Yesterday FINDINGS: Cardiomegaly and vascular pedicle widening with diffuse congested appearance of vessels. No Kerley lines, effusion, or pneumothorax seen. Rightward tracheal deviation known from 2021 CT and thyroidal. IMPRESSION: Cardiomegaly and vascular congestion. Electronically Signed   By: Jorje Guild M.D.   On: 05/26/2021 06:40   DG Chest Port 1 View  Result Date: 05/26/2021 CLINICAL DATA:  Shortness of breath EXAM: PORTABLE CHEST 1 VIEW COMPARISON:  05/24/2021 FINDINGS: Cardiac shadow is enlarged but stable. Increased central vascular congestion is again seen and worsened in the interval. Patchy atelectatic changes are noted in  the right base. IMPRESSION: Increasing vascular congestion with right basilar atelectasis. Electronically Signed   By: Inez Catalina M.D.   On: 05/26/2021 00:18    Today   Subjective    Breanna Moore today has no headache,no chest abdominal pain,no new weakness tingling or numbness, feels much better, she is walking in the room and wants to go home.   Objective   Blood pressure 120/80, pulse 90, temperature 97.9 F (36.6 C), temperature source Oral, resp. rate 19, height 5\' 5"  (1.651 m), SpO2 93 %.   Intake/Output Summary (Last 24 hours) at 05/31/2021 0909 Last data filed at 05/30/2021 1900 Gross per 24 hour  Intake 984.74 ml  Output --  Net 984.74 ml    Exam  Awake Alert, No new F.N deficits,    [place].AT,PERRAL Supple Neck,   Symmetrical Chest wall movement, Good air movement bilaterally, CTAB RRR,No Gallops,   +ve B.Sounds, Abd Soft, Non tender,  No Cyanosis, Clubbing or edema    Data Review   CBC w Diff:  Lab Results  Component Value Date   WBC 6.5 05/30/2021   HGB 10.1 (L) 05/30/2021   HCT 36.4 05/30/2021   HCT 37.2 08/29/2019   PLT 339 05/30/2021   LYMPHOPCT 27 05/30/2021   MONOPCT 9 05/30/2021   EOSPCT 3 05/30/2021   BASOPCT 0 05/30/2021    CMP:  Lab Results  Component Value Date   NA 140 05/30/2021   K 4.2 05/30/2021   CL 98 05/30/2021   CO2 36 (H) 05/30/2021   BUN 15 05/30/2021   CREATININE 0.66 05/30/2021   PROT 7.1 05/30/2021   ALBUMIN 2.5 (L) 05/30/2021   BILITOT 0.1 (L) 05/30/2021   ALKPHOS 60 05/30/2021   AST 13 (L) 05/30/2021   ALT 13 05/30/2021  .   Total Time in preparing paper work, data evaluation and todays exam -  35 minutes  Lala Lund M.D on 05/31/2021 at 9:09 AM  [facility]
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
## 2021-05-29 NOTE — Discharge Instructions (Signed)
Follow with Primary MD Andria Frames, PA-C in 7 days   Get CBC, CMP, 2 view Chest X ray -  checked next visit within 1 week by PCP or [facility] MD    Activity: As tolerated with Full fall precautions use walker/cane & assistance as needed  Disposition [facility]  Diet: Dysphagia 3 diet with 1.5 L fluid restrictions per day, feeding assistance and aspiration precautions  Check your Weight same time everyday, if you gain over 2 pounds, or you develop in leg swelling, experience more shortness of breath or chest pain, call your Primary MD immediately. Follow Cardiac Low Salt Diet and 1.5 lit/day fluid restriction.  Special Instructions: If you have smoked or chewed Tobacco  in the last 2 yrs please stop smoking, stop any regular Alcohol  and or any Recreational drug use.  On your next visit with your primary care physician please Get Medicines reviewed and adjusted.  Please request your Prim.MD to go over all Hospital Tests and Procedure/Radiological results at the follow up, please get all Hospital records sent to your Prim MD by signing hospital release before you go home.  If you experience worsening of your admission symptoms, develop shortness of breath, life threatening emergency, suicidal or homicidal thoughts you must seek medical attention immediately by calling 911 or calling your MD immediately  if symptoms less severe.  You Must read complete instructions/literature along with all the possible adverse reactions/side effects for all the Medicines you take and that have been prescribed to you. Take any new Medicines after you have completely understood and accpet all the possible adverse reactions/side effects.

## 2021-05-29 NOTE — TOC Progression Note (Addendum)
CSW no current inpatient psych beds available for patient today. CSW contacted [facility] confirmed no bed available today to refax referral over for possibility of inpatient psych bed tomorrow. [facility] confirmed they received referral and will call CSW back on referral determination.CSW awaiting call back.CSW called [facility] to check on referral status Kathlene November in admissions answered call and transferred CSW to [place] no answer. CSW LVM. CSW awaiting callback on referral status. CSW updated patients mother and MD.CSW will continue to follow and assist with patients dc planning needs.

## 2021-05-30 DIAGNOSIS — F29 Unspecified psychosis not due to a substance or known physiological condition: Secondary | ICD-10-CM

## 2021-05-30 LAB — CBC WITH DIFFERENTIAL/PLATELET
Abs Immature Granulocytes: 0.02 10*3/uL (ref 0.00–0.07)
Basophils Absolute: 0 10*3/uL (ref 0.0–0.1)
Basophils Relative: 0 %
Eosinophils Absolute: 0.2 10*3/uL (ref 0.0–0.5)
Eosinophils Relative: 3 %
HCT: 36.4 % (ref 36.0–46.0)
Hemoglobin: 10.1 g/dL — ABNORMAL LOW (ref 12.0–15.0)
Immature Granulocytes: 0 %
Lymphocytes Relative: 27 %
Lymphs Abs: 1.7 10*3/uL (ref 0.7–4.0)
MCH: 19.5 pg — ABNORMAL LOW (ref 26.0–34.0)
MCHC: 27.7 g/dL — ABNORMAL LOW (ref 30.0–36.0)
MCV: 70.1 fL — ABNORMAL LOW (ref 80.0–100.0)
Monocytes Absolute: 0.6 10*3/uL (ref 0.1–1.0)
Monocytes Relative: 9 %
Neutro Abs: 4 10*3/uL (ref 1.7–7.7)
Neutrophils Relative %: 61 %
Platelets: 339 10*3/uL (ref 150–400)
RBC: 5.19 MIL/uL — ABNORMAL HIGH (ref 3.87–5.11)
RDW: 22.5 % — ABNORMAL HIGH (ref 11.5–15.5)
WBC: 6.5 10*3/uL (ref 4.0–10.5)
nRBC: 0 % (ref 0.0–0.2)

## 2021-05-30 LAB — BRAIN NATRIURETIC PEPTIDE: B Natriuretic Peptide: 16.2 pg/mL (ref 0.0–100.0)

## 2021-05-30 LAB — COMPREHENSIVE METABOLIC PANEL
ALT: 13 U/L (ref 0–44)
AST: 13 U/L — ABNORMAL LOW (ref 15–41)
Albumin: 2.5 g/dL — ABNORMAL LOW (ref 3.5–5.0)
Alkaline Phosphatase: 60 U/L (ref 38–126)
Anion gap: 6 (ref 5–15)
BUN: 15 mg/dL (ref 6–20)
CO2: 36 mmol/L — ABNORMAL HIGH (ref 22–32)
Calcium: 9 mg/dL (ref 8.9–10.3)
Chloride: 98 mmol/L (ref 98–111)
Creatinine, Ser: 0.66 mg/dL (ref 0.44–1.00)
GFR, Estimated: >60 mL/min (ref >60)
Glucose, Bld: 102 mg/dL — ABNORMAL HIGH (ref 70–99)
Potassium: 4.2 mmol/L (ref 3.5–5.1)
Sodium: 140 mmol/L (ref 135–145)
Total Bilirubin: 0.1 mg/dL — ABNORMAL LOW (ref 0.3–1.2)
Total Protein: 7.1 g/dL (ref 6.5–8.1)

## 2021-05-30 LAB — MAGNESIUM: Magnesium: 2 mg/dL (ref 1.7–2.4)

## 2021-05-30 LAB — C-REACTIVE PROTEIN: CRP: 4.7 mg/dL — ABNORMAL HIGH (ref <1.0)

## 2021-05-30 MED ORDER — FUROSEMIDE 10 MG/ML IJ SOLN
60.0000 mg | Freq: Once | INTRAMUSCULAR | Status: AC
  Administered 2021-05-30: 60 mg via INTRAVENOUS
  Filled 2021-05-30: qty 6

## 2021-05-30 NOTE — Progress Notes (Signed)
PROGRESS NOTE                                                                                                                                                                                                             Patient Demographics:    Breanna Moore, is a 46 y.o. female, DOB - [birth year], [id number]  Outpatient Primary MD for the patient is Gerald Leitz    LOS - 4  Admit date - 05/25/2021    Chief Complaint  Patient presents with   Psychiatric Evaluation       Brief Narrative (HPI from H&P)   46 year old female with past medical history of schizoaffective disorder, hypertension, morbid obesity, obesity hypoventilation syndrome (has followed with Dr. Camillo Flaming with [facility] in the past), restrictive lung disease, severe obstructive sleep apnea (PSG 10/2019 AHI 63) who presented to [facility] emergency department via EMS with complaints of chest and abdominal pain, apparently she has stopped taking her psych medications at home for a while and has not been using her BiPAP as she has lost the machine, in the ER she exhibited highly erratic behavior, she took her clothes off and was pacing in the ER, she was aggressive to the staff, she subsequently received Geodon and then developed hypercapnic respiratory failure.   Subjective:   Patient walking in the room appears little bit anxious, does not want to talk but says she feels much better, denies any headache or shortness of breath.   Assessment  & Plan :    * Acute respiratory failure with hypercapnia (HCC)- (present on admission) Due to combination of acute on chronic diastolic CHF, receiving Geodon in the ER, underlying OSA and OHS and noncompliance with BiPAP with severe toxic encephalopathy.  She has responded well to Lasix with diuresis, along with BiPAP.  Encephalopathy has resolved.  Continue to give her Lasix for diuresis BiPAP at  nighttime if she is compliant, medically stable looking for [facility] bed for discharge.   Schizoaffective disorder, bipolar type ([place])- (present on admission) Per patient's mother at the time of admission patient has been exhibiting highly erratic behavior since July, she has been noncompliant with her psych medications and other medications.  She is exhibiting the same behavior in the hospital psych frequently refused medications, seen by psych placed on Depakote, Saphris &  ARIPiprazole somewhat improved, we await [facility] H placement.  Obesity hypoventilation syndrome ([place])- (present on admission) Longstanding known history of obesity hypoventilation syndrome with patient having followed with Dr. Camillo Flaming with pulmonology at [facility], BiPAP nightly, weight loss with PCP thereafter outpatient follow-up with her pulmonologist postdischarge   OSA (obstructive sleep apnea)- (present on admission) See #1 above  Essential hypertension- (present on admission) Stable on combination of diuretics and lisinopril.   Acute on chronic diastolic CHF (congestive heart failure) ([place])- (present on admission) Please see assessment and plan above  BMI 60.0-69.9, adult ([place]) With underlying OSA, OHS and noncompliance with BiPAP.  Counseled on compliance.  Follow with PCP on weight loss            Condition - Extremely Guarded  Family Communication  :  called Mother miss Santiago Glad [phone] on 05/26/2021 at 11:49 AM and message left, 05/28/2021 at 10:07 AM  Code Status :  Full  Consults  :  Psych  PUD Prophylaxis :    Procedures  :            Disposition Plan  :    Status is: Inpatient   DVT Prophylaxis  :    enoxaparin (LOVENOX) injection 40 mg Start: 05/26/21 1000    Lab Results  Component Value Date   PLT 339 05/30/2021    Diet :  Diet Order             DIET DYS 3 Room service appropriate? No; Fluid consistency: Thin  Diet effective now                    Inpatient  Medications  Scheduled Meds:  ARIPiprazole  10 mg Oral Daily   chlorhexidine  15 mL Mouth Rinse BID   divalproex  500 mg Oral Q12H   enoxaparin (LOVENOX) injection  40 mg Subcutaneous Daily   fluticasone  2 spray Each Nare Daily   lisinopril  10 mg Oral Daily   mouth rinse  15 mL Mouth Rinse q12n4p   sodium chloride flush  10-40 mL Intracatheter Q12H   Continuous Infusions:  sodium chloride Stopped (05/29/21 1133)   PRN Meds:.sodium chloride, acetaminophen **OR** acetaminophen, alum & mag hydroxide-simeth, asenapine, haloperidol lactate, hydrALAZINE, hydrOXYzine, ipratropium-albuterol, melatonin, ondansetron **OR** ondansetron (ZOFRAN) IV, polyethylene glycol, sodium chloride flush  Antibiotics  :    Anti-infectives (From admission, onward)    None        Time Spent in minutes  30   Lala Lund M.D on 05/30/2021 at 10:23 AM  To page go to [email or web address]   [facility] -  Office  [phone]  See all Orders from today for further details    Objective:   Vitals:   05/29/21 1600 05/29/21 2009 05/30/21 0607 05/30/21 0842  BP: 128/76 117/70 128/79   Pulse: 100 90 89 90  Resp: 16 20 19 20   Temp: 98.7 F (37.1 C) 97.9 F (36.6 C) 99.1 F (37.3 C) 97.6 F (36.4 C)  TempSrc: Oral Oral Oral Oral  SpO2: 99%  98% 98%  Height:        Wt Readings from Last 3 Encounters:  04/19/21 124.7 kg  03/15/21 (!) 147.4 kg  01/22/21 (!) 174.2 kg     Intake/Output Summary (Last 24 hours) at 05/30/2021 1023 Last data filed at 05/30/2021 0736 Gross per 24 hour  Intake 480 ml  Output 800 ml  Net -320 ml     Physical Exam  Awake Alert, No new F.N  deficits,   [place].AT,PERRAL Supple Neck, No JVD,   Symmetrical Chest wall movement, Good air movement bilaterally, CTAB RRR,No Gallops, Rubs or new Murmurs,  +ve B.Sounds, Abd Soft, No tenderness,   No Cyanosis, Clubbing or edema     Data Review:    CBC Recent Labs  Lab 05/26/21 0443 05/27/21 1000 05/28/21 0318  05/29/21 0825 05/30/21 0337  WBC 8.0 8.4 8.4 6.7 6.5  HGB 10.8* 10.3* 9.9* 10.3* 10.1*  HCT 39.3 36.8 36.5 36.8 36.4  PLT 266 331 336 325 339  MCV 68.9* 68.4* 69.5* 68.9* 70.1*  MCH 18.9* 19.1* 18.9* 19.3* 19.5*  MCHC 27.5* 28.0* 27.1* 28.0* 27.7*  RDW 23.7* 23.2* 23.2* 22.8* 22.5*  LYMPHSABS 1.1 1.6 1.9 1.5 1.7  MONOABS 0.6 0.5 0.6 0.5 0.6  EOSABS 0.0 0.0 0.1 0.1 0.2  BASOSABS 0.0 0.0 0.0 0.0 0.0    Electrolytes Recent Labs  Lab 05/25/21 2240 05/26/21 0252 05/26/21 0443 05/27/21 1000 05/28/21 0318 05/29/21 0825 05/29/21 0827 05/30/21 0337  NA 137   < > 140 136 138 137  --  140  K 3.7   < > 4.6 4.3 4.0 4.2  --  4.2  CL 102  --  104 100 98 96*  --  98  CO2 24  --  27 30 34* 33*  --  36*  GLUCOSE 89  --  77 97 124* 113*  --  102*  BUN 6  --  6 9 12 12   --  15  CREATININE 0.72  --  0.73 0.76 0.88 0.67  --  0.66  CALCIUM 9.1  --  8.9 8.8* 8.7* 8.8*  --  9.0  AST 17  --  21 16 14*  --   --  13*  ALT 14  --  15 13 13   --   --  13  ALKPHOS 71  --  68 59 64  --   --  60  BILITOT 0.7  --  1.0 0.6 0.4  --   --  0.1*  ALBUMIN 3.1*  --  2.8* 2.8* 2.6*  --   --  2.5*  MG  --   --  1.9 2.0 1.7 2.3  --  2.0  CRP  --   --  6.1* 6.0* 6.7* 6.0*  --  4.7*  PROCALCITON  --   --  <0.10  --   --   --   --   --   BNP  --   --  52.7 27.3 19.7  --  17.5 16.2   < > = values in this interval not displayed.    ------------------------------------------------------------------------------------------------------------------ No results for input(s): CHOL, HDL, LDLCALC, TRIG, CHOLHDL, LDLDIRECT in the last 72 hours.  No results found for: HGBA1C  No results for input(s): TSH, T4TOTAL, T3FREE, THYROIDAB in the last 72 hours.  Invalid input(s): FREET3 ------------------------------------------------------------------------------------------------------------------ ID Labs Recent Labs  Lab 05/26/21 0443 05/27/21 1000 05/28/21 0318 05/29/21 0825 05/30/21 0337  WBC 8.0 8.4 8.4 6.7 6.5   PLT 266 331 336 325 339  CRP 6.1* 6.0* 6.7* 6.0* 4.7*  PROCALCITON <0.10  --   --   --   --   CREATININE 0.73 0.76 0.88 0.67 0.66   Cardiac Enzymes No results for input(s): CKMB, TROPONINI, MYOGLOBIN in the last 168 hours.  Invalid input(s): CK   Radiology Reports No results found.

## 2021-05-30 NOTE — Consult Note (Signed)
[facility] Face-to-Face Psychiatry Consult follow up  Reason for Consult:  ''psychosis, capacity determination'' Referring Physician:  Lala Lund, MD Patient Identification: Breanna Moore MRN:  [id number] Principal Diagnosis: Acute respiratory failure with hypercapnia ([place]) Diagnosis:  Principal Problem:   Acute respiratory failure with hypercapnia ([place]) Active Problems:   Schizoaffective disorder, bipolar type ([place])   Essential hypertension   OSA (obstructive sleep apnea)   BMI 60.0-69.9, adult ([place])   Obesity hypoventilation syndrome ([place])   Acute on chronic diastolic CHF (congestive heart failure) ([place])   Total Time spent with patient: 30 minutes  Subjective:   Breanna Moore is a 46 y.o. female patient admitted with erratic behavior, chest and abdominal pain. Psych consulted for  Psychosis , h/o Schizophrenia and Refusing psych meds.   Treatment Plan Summary: 46 year old female with long history of mental illness, multiple medical issues who is non-compliant with psychiatric medications. Psychiatric consult was initiated for psychosis and capacity determination. However, patient is belligerent and uncooperative with full evaluation.  Base on her current behavior, she does not appear to have Capacity to make informed medical consent. Also, she will benefit from psychiatric medication management and optimization of medical treatment.  05/27/21: Patient seen face to face this morning but unable to participate in psychiatric interview due to being asleep. Per staff nurse and hospitalist, patient still refuse to accept most of the medications prescribed for her. Her behavior continues to be erratic, belligerent and easily agitated. Patient still meets criteria for inpatient psychiatric admission once medically stabilized.  05/26/21: HPI:  46 year old female with medical history of hypertension, morbid obesity, obesity hypoventilation syndrome (has followed with Breanna Moore with [facility] in the past), restrictive lung disease, severe obstructive sleep apnea (PSG 10/2019 AHI 63) and was admitted for chest and abdominal pain. Patient refused to cooperate, she is belligerent, sarcastic and a meaningful capacity determination and psychiatric evaluation is difficult at this time. However, chart reports and collateral information revealed that patient has along history of Schizoaffective disorder-mixed type, personality disorder and panic disorder for which she has been seeing Breanna Moore in [facility], [place](last appointment-11/22/20). She was prescribe Prozac and Abilify but essentially non-complaint. Today, her behavior remains highly erratic, she is easily agitated, verbally aggressive, defiant, oppositional and appears to be a danger to self and other.    05/28/21: Patient is calm and cooperative today.  Denies SI, HI, VH.  Patient is paranoid and feels like other people draining her energy.  Endorses AH and reports talking to spirits and thought broadcasting.  Patient has diagnosis of schizoaffective disorder and has been noncompliant with her psych medications. Patient continues to meet criteria for inpatient hospitalization due to psychosis.  CSW helping with placement.  Patient was rejected by [facility] due to oxygen needs.  CSW will fax patient out to other med psych facilities.  05/30/21-Patient is much more coherent than before.  Denies SI, HI, AVH.  Denies paranoia, first rank symptoms and ideas of reference.  Talked to dad who also thinks that patient sounded much better than before but thinks that she needs mental health help.  Patient does not meet criteria for IVC or inpatient psych admission.  Patient is psych cleared and can be discharged from psychiatry standpoint with recommendation for outpatient psych follow-up for medication management.  Recommendations: -Can discontinue safety sitter -Patient is psychiatrically cleared.  Does not meet criteria for IVC or inpatient  psych admission. -Patient needs to follow-up with outpatient psychiatrist for medication management. -Continue Abilify  10 mg daily for psychosis/mood  - Prozac discontinued -Continue Depakote DR 500 mg PO twice daily for Bipolar.  -Consider IV Depakote 500 mg twice daily if patient refuse PO -Add Asenapine 10 mg S/L twice daily as needed for agitation and psychosis -Consider Haldol 10 mg IM plus Benadryl 25 mg IM every 12 hours as needed for agitation/psychosis if patient refused po Asenapine - 12 leads EKG may be warranted to monitor QTc interval if patient takes psychotropic daily. Qtc  1/30- 445.   Disposition: - -Patient is psychiatrically cleared.  Does not meet criteria for IVC or inpatient psych admission. - Refer for Outpatient Psychiatry Follow up.   Subjective : Patient is seen today.  She states she is feeling good and ready to go home.  She states she feels claustrophobic inside her room and wants to walk in the hallway.  She denies SI, HI, AVH.  She states she has not heard any voices for last few days. She denies any thought broadcasting, thought withdrawal, thought insertion, and ideas of reference.  She does not feel that she is getting messages from electronic devices.  She states she believes in God and [place], reads Bible and people think that she is crazy.  She states that she does not like when somebody calls her insane.  She states her thoughts are much more organized now and her mood is stable with medications.  She plans to continue taking her psych medications.  Patient fixated on getting discharged.  She is alert and oriented x 4.  Knows current and past presidents. Can do DOWB.  Patient gives consent to talk to dad but not to mom.  Breanna Moore @ [phone] states that he talked to patient this morning and she sounded much better.  Dad states she did not say anything about suicidal ideations or hallucinations.  Dad states that patient had hallucinations in  the past and she does not take her medications regularly.  Dad thinks that she needs mental health help. Discussed that patient does not meet criteria for inpatient hospitalization and is psychiatrically stable.  Dad states that it would be better if we call her mom as she knows the patient better.    Triage note: Patient presents to ed via [facility] from home c/o abd. Pain  patient is asking everyone if they believe in [place], if not you can't take care of me. History of schz.   Past Psychiatric History: as above  Risk to Self:  yes Risk to Others:  yes Prior Inpatient Therapy:  [facility]-10/27/[facility] Prior Outpatient Therapy:  Dr. Cordie Grice, [place], [place]  Past Medical History:  Past Medical History:  Diagnosis Date   BMI 60.0-69.9, adult ([place])    Depression    Hypertension    Morbid obesity ([place])    Non-toxic multinodular goiter 08/29/2019   CTA-Chest 08/28/19: Asymmetric enlargement thyroid lobes, L>R, extending retrosternal with slight mass effect upon the trachea and mild LEFT-to-RIGHT midline shift.   Sleep apnea     Past Surgical History:  Procedure Laterality Date   CESAREAN SECTION     Family History:  Family History  Problem Relation Age of Onset   Schizophrenia Mother    Family Psychiatric  History:  Social History:  Social History   Substance and Sexual Activity  Alcohol Use Yes   Alcohol/week: 0.0 standard drinks   Comment: occasional     Social History   Substance and Sexual Activity  Drug Use No  Social History   Socioeconomic History   Marital status: Single    Spouse name: Not on file   Number of children: Not on file   Years of education: Not on file   Highest education level: Not on file  Occupational History   Not on file  Tobacco Use   Smoking status: Every Day    Types: Cigarettes   Smokeless tobacco: Never  Vaping Use   Vaping Use: Never used  Substance and Sexual Activity   Alcohol use: Yes    Alcohol/week: 0.0 standard drinks     Comment: occasional   Drug use: No   Sexual activity: Never    Birth control/protection: None  Other Topics Concern   Not on file  Social History Narrative   Not on file   Social Determinants of Health   Financial Resource Strain: Not on file  Food Insecurity: Not on file  Transportation Needs: Not on file  Physical Activity: Not on file  Stress: Not on file  Social Connections: Not on file   Additional Social History:    Allergies:   Allergies  Allergen Reactions   Geodon [Ziprasidone Hcl] Other (See Comments)    Patient became hypoxic and required nonrebreather. Please avoid or try low dose    Hydrocodone-Acetaminophen    Ketorolac    Paroxetine Hcl     Other reaction(s): Confusion   Penicillins Rash    Has patient had a PCN reaction causing immediate rash, facial/tongue/throat swelling, SOB or lightheadedness with hypotension: Yes Has patient had a PCN reaction causing severe rash involving mucus membranes or skin necrosis: No Has patient had a PCN reaction that required hospitalization: No Has patient had a PCN reaction occurring within the last 10 years: No If all of the above answers are "NO", then may proceed with Cephalosporin use.     Labs:  Results for orders placed or performed during the hospital encounter of 05/25/21 (from the past 48 hour(s))  CBC with Differential/Platelet     Status: Abnormal   Collection Time: 05/29/21  8:25 AM  Result Value Ref Range   WBC 6.7 4.0 - 10.5 K/uL   RBC 5.34 (H) 3.87 - 5.11 MIL/uL   Hemoglobin 10.3 (L) 12.0 - 15.0 g/dL   HCT 36.8 36.0 - 46.0 %   MCV 68.9 (L) 80.0 - 100.0 fL   MCH 19.3 (L) 26.0 - 34.0 pg   MCHC 28.0 (L) 30.0 - 36.0 g/dL   RDW 22.8 (H) 11.5 - 15.5 %   Platelets 325 150 - 400 K/uL    Comment: REPEATED TO VERIFY   nRBC 0.0 0.0 - 0.2 %   Neutrophils Relative % 68 %   Neutro Abs 4.5 1.7 - 7.7 K/uL   Lymphocytes Relative 23 %   Lymphs Abs 1.5 0.7 - 4.0 K/uL   Monocytes Relative 7 %   Monocytes  Absolute 0.5 0.1 - 1.0 K/uL   Eosinophils Relative 2 %   Eosinophils Absolute 0.1 0.0 - 0.5 K/uL   Basophils Relative 0 %   Basophils Absolute 0.0 0.0 - 0.1 K/uL   Immature Granulocytes 0 %   Abs Immature Granulocytes 0.02 0.00 - 0.07 K/uL   Polychromasia PRESENT     Comment: Performed at [facility], [address], [place], [place] [id number]  Basic metabolic panel     Status: Abnormal   Collection Time: 05/29/21  8:25 AM  Result Value Ref Range   Sodium 137 135 - 145 mmol/L  Potassium 4.2 3.5 - 5.1 mmol/L   Chloride 96 (L) 98 - 111 mmol/L   CO2 33 (H) 22 - 32 mmol/L   Glucose, Bld 113 (H) 70 - 99 mg/dL    Comment: Glucose reference range applies only to samples taken after fasting for at least 8 hours.   BUN 12 6 - 20 mg/dL   Creatinine, Ser 0.67 0.44 - 1.00 mg/dL   Calcium 8.8 (L) 8.9 - 10.3 mg/dL   GFR, Estimated >60 >60 mL/min    Comment: (NOTE) Calculated using the CKD-EPI Creatinine Equation (2021)    Anion gap 8 5 - 15    Comment: Performed at [facility] [address] [place], [place] [address]  C-reactive protein     Status: Abnormal   Collection Time: 05/29/21  8:25 AM  Result Value Ref Range   CRP 6.0 (H) <1.0 mg/dL    Comment: Performed at [facility] [address] [place], [place] [address]  Magnesium     Status: None   Collection Time: 05/29/21  8:25 AM  Result Value Ref Range   Magnesium 2.3 1.7 - 2.4 mg/dL    Comment: Performed at [facility] [address], [place], [place] [address]  Brain natriuretic peptide     Status: None   Collection Time: 05/29/21  8:27 AM  Result Value Ref Range   B Natriuretic Peptide 17.5 0.0 - 100.0 pg/mL    Comment: Performed at [facility] [address], [place], [place] [address]  Magnesium     Status: None   Collection Time: 05/30/21  3:37 AM  Result Value Ref Range   Magnesium 2.0 1.7 - 2.4 mg/dL    Comment: Performed at [facility] [address], [place], [place]  [address]  Comprehensive metabolic panel     Status: Abnormal   Collection Time: 05/30/21  3:37 AM  Result Value Ref Range   Sodium 140 135 - 145 mmol/L   Potassium 4.2 3.5 - 5.1 mmol/L   Chloride 98 98 - 111 mmol/L   CO2 36 (H) 22 - 32 mmol/L   Glucose, Bld 102 (H) 70 - 99 mg/dL    Comment: Glucose reference range applies only to samples taken after fasting for at least 8 hours.   BUN 15 6 - 20 mg/dL   Creatinine, Ser 0.66 0.44 - 1.00 mg/dL   Calcium 9.0 8.9 - 10.3 mg/dL   Total Protein 7.1 6.5 - 8.1 g/dL   Albumin 2.5 (L) 3.5 - 5.0 g/dL   AST 13 (L) 15 - 41 U/L   ALT 13 0 - 44 U/L   Alkaline Phosphatase 60 38 - 126 U/L   Total Bilirubin 0.1 (L) 0.3 - 1.2 mg/dL   GFR, Estimated >60 >60 mL/min    Comment: (NOTE) Calculated using the CKD-EPI Creatinine Equation (2021)    Anion gap 6 5 - 15    Comment: Performed at [facility], [place] [address], [place], [place] [address]  C-reactive protein     Status: Abnormal   Collection Time: 05/30/21  3:37 AM  Result Value Ref Range   CRP 4.7 (H) <1.0 mg/dL    Comment: Performed at [facility] [address] [place], [place] [address]  CBC with Differential/Platelet     Status: Abnormal   Collection Time: 05/30/21  3:37 AM  Result Value Ref Range   WBC 6.5 4.0 - 10.5 K/uL  RBC 5.19 (H) 3.87 - 5.11 MIL/uL   Hemoglobin 10.1 (L) 12.0 - 15.0 g/dL   HCT 36.4 36.0 - 46.0 %   MCV 70.1 (L) 80.0 - 100.0 fL   MCH 19.5 (L) 26.0 - 34.0 pg   MCHC 27.7 (L) 30.0 - 36.0 g/dL   RDW 22.5 (H) 11.5 - 15.5 %   Platelets 339 150 - 400 K/uL   nRBC 0.0 0.0 - 0.2 %   Neutrophils Relative % 61 %   Neutro Abs 4.0 1.7 - 7.7 K/uL   Lymphocytes Relative 27 %   Lymphs Abs 1.7 0.7 - 4.0 K/uL   Monocytes Relative 9 %   Monocytes Absolute 0.6 0.1 - 1.0 K/uL   Eosinophils Relative 3 %   Eosinophils Absolute 0.2 0.0 - 0.5 K/uL   Basophils Relative 0 %   Basophils Absolute 0.0 0.0 - 0.1 K/uL   WBC Morphology MORPHOLOGY UNREMARKABLE    Smear Review  MORPHOLOGY UNREMARKABLE    Immature Granulocytes 0 %   Abs Immature Granulocytes 0.02 0.00 - 0.07 K/uL   Polychromasia PRESENT     Comment: Performed at [facility], [address] [place], [place] [address]  Brain natriuretic peptide     Status: None   Collection Time: 05/30/21  3:37 AM  Result Value Ref Range   B Natriuretic Peptide 16.2 0.0 - 100.0 pg/mL    Comment: Performed at [facility] [address] [place], [place] [address]    Current Facility-Administered Medications  Medication Dose Route Frequency Provider Last Rate Last Admin   0.9 %  sodium chloride infusion   Intravenous PRN Thurnell Lose, MD   Stopped at 05/29/21 1133   acetaminophen (TYLENOL) tablet 650 mg  650 mg Oral Q6H PRN Vernelle Emerald, MD       Or   acetaminophen (TYLENOL) suppository 650 mg  650 mg Rectal Q6H PRN Shalhoub, Sherryll Burger, MD       alum & mag hydroxide-simeth (MAALOX/MYLANTA) 200-200-20 MG/5ML suspension 30 mL  30 mL Oral Q8H PRN Thurnell Lose, MD   30 mL at 05/29/21 0759   ARIPiprazole (ABILIFY) tablet 10 mg  10 mg Oral Daily Shalhoub, Sherryll Burger, MD   10 mg at 05/30/21 0848   asenapine (SAPHRIS) sublingual tablet 10 mg  10 mg Sublingual BID PRN Akintayo, Mojeed, MD       chlorhexidine (PERIDEX) 0.12 % solution 15 mL  15 mL Mouth Rinse BID Thurnell Lose, MD   15 mL at 05/30/21 0855   divalproex (DEPAKOTE) DR tablet 500 mg  500 mg Oral Q12H Akintayo, Mojeed, MD   500 mg at 05/30/21 0848   enoxaparin (LOVENOX) injection 40 mg  40 mg Subcutaneous Daily Shalhoub, Sherryll Burger, MD   40 mg at 05/30/21 0849   fluticasone (FLONASE) 50 MCG/ACT nasal spray 2 spray  2 spray Each Nare Daily Chotiner, Yevonne Aline, MD   2 spray at 05/30/21 0855   haloperidol lactate (HALDOL) injection 10 mg  10 mg Intramuscular Q6H PRN Chotiner, Yevonne Aline, MD   10 mg at 05/26/21 2330   hydrALAZINE (APRESOLINE) injection 10 mg  10 mg Intravenous Q6H PRN Shalhoub, Sherryll Burger, MD       hydrOXYzine (ATARAX) tablet  25 mg  25 mg Oral Q6H PRN Vernelle Emerald, MD   25 mg at 05/28/21 1742   ipratropium-albuterol (DUONEB) 0.5-2.5 (3) MG/3ML nebulizer solution 3 mL  3 mL Nebulization Q4H PRN Inda Merlin  J, MD       lisinopril (ZESTRIL) tablet 10 mg  10 mg Oral Daily Shalhoub, Sherryll Burger, MD   10 mg at 05/30/21 0848   MEDLINE mouth rinse  15 mL Mouth Rinse q12n4p Thurnell Lose, MD   15 mL at 05/29/21 1736   melatonin tablet 5 mg  5 mg Oral QHS PRN Thurnell Lose, MD   5 mg at 05/27/21 2120   ondansetron (ZOFRAN) tablet 4 mg  4 mg Oral Q6H PRN Vernelle Emerald, MD   4 mg at 05/26/21 2118   Or   ondansetron (ZOFRAN) injection 4 mg  4 mg Intravenous Q6H PRN Shalhoub, Sherryll Burger, MD       polyethylene glycol (MIRALAX / GLYCOLAX) packet 17 g  17 g Oral Daily PRN Shalhoub, Sherryll Burger, MD       sodium chloride flush (NS) 0.9 % injection 10-40 mL  10-40 mL Intracatheter Q12H Thurnell Lose, MD   10 mL at 05/30/21 0856   sodium chloride flush (NS) 0.9 % injection 10-40 mL  10-40 mL Intracatheter PRN Thurnell Lose, MD        Musculoskeletal: Strength & Muscle Tone: uncooperative with assessment. Gait & Station: uncooperative with assessment. Patient leans: N/A     Psychiatric Specialty Exam:  Presentation  General Appearance: -- (wearing a hat)  Eye Contact:Fair  Speech:Normal Rate  Speech Volume:Normal  Handedness:Right   Mood and Affect  Mood:Euthymic; Irritable  Affect:Constricted   Thought Process  Thought Processes:-- (Hyperreligious, Muc more coherant than before. Denies SI, HI, AVH)  Descriptions of Associations:Tangential  Orientation:Full (Time, Place and Person)  Thought Content:Tangential (Fixated on discharge)  History of Schizophrenia/Schizoaffective disorder:Yes  Duration of Psychotic Symptoms:Greater than six months  Hallucinations:Hallucinations: None Description of Auditory Hallucinations: none today  Ideas of Reference:None  Suicidal  Thoughts:Suicidal Thoughts: No  Homicidal Thoughts:Homicidal Thoughts: No   Sensorium  Memory:Recent Fair; Remote Fair; Immediate Fair  Judgment:-- (improved)  Insight:Shallow   Executive Functions  Concentration:Fair  Attention Span:Fair  [place]   Psychomotor Activity  Psychomotor Activity:Psychomotor Activity: Normal   Assets  Assets:Housing; Social Support; Communication Skills   Sleep  Sleep:Sleep: Good   Physical Exam: Physical Exam Review of Systems  Psychiatric/Behavioral:  Negative for depression, hallucinations and suicidal ideas. The patient is not nervous/anxious and does not have insomnia.   Blood pressure 120/71, pulse 81, temperature 97.7 F (36.5 C), temperature source Oral, resp. rate 20, height 5\' 5"  (1.651 m), SpO2 98 %. Body mass index is 45.76 kg/m.   Armando Reichert, MD 05/30/2021 2:44 PM

## 2021-05-30 NOTE — TOC Progression Note (Addendum)
Transition of Care [facility]) - Progression Note    Patient Details  Name: Breanna Moore MRN: [id number] Date of Birth: Apr 04, 1976  Transition of Care [facility]) CM/SW [place], [place] Phone Number: 05/30/2021, 10:03 AM  Clinical Narrative:     Update- CSW received call from MD. Patient was medically cleared from psych. CSW met with patient at bedside. Patient reports her plan at dc is to return back home to her apartment. Patient reports she comes from home with her son. Patient reports she is financially stable.Patient reports she lost her EBT card. CSW provided patient with [employer] assistance guide [facility] with number to call to help assist with getting a new EBT card.Patient reports her son helps assist with cooking,cleaning, and groceries. Patient reports she feels safe returning back home. Patient informed CSW she will need transportation back home when medically ready for dc. CSW provided patient with psychiatry resources and ACTT resources for [employer].Patient accepted.Patient request shoes. CSW provided patient with pair of shoes from SW closet.Patients mother called CSW.Patients mother request to speak with MD regarding some concerns she has. CSW informed  MD. CSW will continue to follow and assist with patients dc planning needs.  Update- CSW spoke with Drue Dun with [facility] who informed CSW no beds available. Request CSW to call back tomorrow to check status on bed availability.CSW to follow up with [facility] in the morning.  Update- CSW called [facility] to check to see if they received referral faxed over earlier. [facility] confirmed they received referral and CSW will receive a call today or tomorrow if bed is available for patient. CSW received call from [facility] from [place] who confirmed patient is now been put on waiting list. Currently awaiting bed availability at [facility].CSW sent referral to [facility] to  see if they can look at patient again for possible inpatient psych placement.  CSW called [facility] who confirmed no beds today. They request for CSW to fax patients referral over again today for possible bed tomorrow if available. CSW spoke with [facility] who informed CSW patients referral is under medication review. [facility] intake informed CSW determination can take 1 to 3 days. CSW spoke with [facility] who request for CSW to fax over patients referral again today. CSW faxed over referral. CSW will check status on referral shortly. CSW called [facility] and [facility] for intake. CSW awaiting callback to check status of patients referral. CSW will continue to follow and assist with patients dc planning needs.       Expected Discharge Plan and Services           Expected Discharge Date: 05/29/21                                     Social Determinants of Health (SDOH) Interventions    Readmission Risk Interventions No flowsheet data found.

## 2021-05-30 NOTE — Progress Notes (Signed)
Pt request to speak with doctor before putting Tele monitor back on after shower. Tech notified RN that Pt would like to speak with her.

## 2021-05-30 NOTE — Progress Notes (Signed)
Mobility Specialist Progress Note    05/30/21 1606  Mobility  Bed Position Chair  Activity Ambulated with assistance in hallway  Level of Assistance Contact guard assist, steadying assist  Assistive Device Front wheel walker  Distance Ambulated (ft) 270 ft  Activity Response Tolerated fair  $Mobility charge 1 Mobility   Pt received in chair and agreeable. Took x1 short standing rest break. Pt SOB with exertion. Returned to chair at sink.   [facility] Mobility Specialist  M.S. 2C and 6E: [phone] M.S. 4E: [phone]

## 2021-05-30 NOTE — Care Management (Signed)
05-30-21 1543 Case Manager received a secure chat that the patient was cleared by psych and can return home. Case Manager spoke with patient and she states she can return to her apartment. Patient reported that her BIPAP at home is broken and will need a new one delivered. Case Manager reached out to [facility] Liaison to see if he can assist with new BIPAP for home. No PT/OT needs for this patient. Case Manager will continue to follow for additional transition of care needs.

## 2021-05-31 ENCOUNTER — Other Ambulatory Visit (HOSPITAL_COMMUNITY): Payer: Self-pay

## 2021-05-31 LAB — VALPROIC ACID LEVEL: Valproic Acid Lvl: 60 ug/mL (ref 50.0–100.0)

## 2021-05-31 MED ORDER — FUROSEMIDE 40 MG PO TABS
40.0000 mg | ORAL_TABLET | Freq: Two times a day (BID) | ORAL | 0 refills | Status: DC
  Filled 2021-05-31: qty 60, 30d supply, fill #0

## 2021-05-31 MED ORDER — ASENAPINE MALEATE 5 MG SL SUBL
10.0000 mg | SUBLINGUAL_TABLET | Freq: Two times a day (BID) | SUBLINGUAL | 0 refills | Status: DC | PRN
  Filled 2021-05-31: qty 60, 15d supply, fill #0

## 2021-05-31 MED ORDER — FUROSEMIDE 10 MG/ML IJ SOLN
60.0000 mg | Freq: Once | INTRAMUSCULAR | Status: DC
  Filled 2021-05-31: qty 6

## 2021-05-31 MED ORDER — DIVALPROEX SODIUM 500 MG PO DR TAB
500.0000 mg | DELAYED_RELEASE_TABLET | Freq: Two times a day (BID) | ORAL | 0 refills | Status: AC
  Filled 2021-05-31: qty 60, 30d supply, fill #0

## 2021-05-31 MED ORDER — ARIPIPRAZOLE 10 MG PO TABS
10.0000 mg | ORAL_TABLET | Freq: Every day | ORAL | 0 refills | Status: AC
  Filled 2021-05-31: qty 30, 30d supply, fill #0

## 2021-05-31 NOTE — Progress Notes (Signed)
Mobility Specialist Progress Note    05/31/21 1012  Mobility  Bed Position Chair  Activity Ambulated independently in hallway  Level of Assistance Standby assist, set-up cues, supervision of patient - no hands on  Assistive Device None  Distance Ambulated (ft) 490 ft  Activity Response Tolerated fair  $Mobility charge 1 Mobility   Pt received at Nurse Station and agreeable to attempt entire hall. Took x1 seated rest break and also stopped at her room to void in BR. Left in chair.   [facility] Mobility Specialist  M.S. 2C and 6E: [phone] M.S. 4E: [phone]

## 2021-05-31 NOTE — Progress Notes (Signed)
Pt was adamantly requesting to go home without waiting for a home BiPap set up.  Explained her that we would like to make sure she would get a Bipap set up for tonight before going home.  She stated she understood it, but she could not wait for it, and would not care if she did not get it for tonight. She requested this nurse to discharge her now.  Hinton Dyer, RN

## 2021-05-31 NOTE — TOC Transition Note (Signed)
Transition of Care [facility]) - CM/SW Discharge Note   Patient Details  Name: Qiana Landgrebe MRN: [id number] Date of Birth: 1976/01/19  Transition of Care [facility]) CM/SW Contact:  Delilah Shan, LCSWA Phone Number: 05/31/2021, 12:52 PM   Clinical Narrative:     Patient will DC to: [address] [place] [address]  Anticipated DC date: 05/31/2021  Family notified: Patient declined  Transport by: Tressie Ellis transport/uber  ?  CSW signing off.   Final next level of care: Home/Self Care     Patient Goals and CMS Choice        Discharge Placement                       Discharge Plan and Services                                     Social Determinants of Health (SDOH) Interventions     Readmission Risk Interventions No flowsheet data found.

## 2021-05-31 NOTE — TOC Transition Note (Addendum)
Transition of Care [facility]) - CM/SW Discharge Note   Patient Details  Name: Breanna Moore MRN: [id number] Date of Birth: 1975-11-15  Transition of Care [facility]) CM/SW Contact:  Verdell Carmine, RN Phone Number: 05/31/2021, 9:12 AM   Clinical Narrative:    D/W pharmacist. Insurance information could not be verified. , patient has no listed insurance. MATCH done for medications, however it will not cover psych medication due to expense. Active discussion with providers ongoing. Information sent to financial counseling for assistance. 1000 left message for [employer] inquiring about BiPap machine which the patient states is not working correctly.  [place] Medication Assistance Card Name:  Breanna Moore ID: [id number] [place]: [id number] RX Group: [id number] Discharge Date:  05/31/2021 Expiration Date:  06/07/2021.    Final next level of care: Home/Self Care     Patient Goals and CMS Choice        Discharge Placement               Home, self care        Discharge Plan and Services                                     Social Determinants of Health (SDOH) Interventions     Readmission Risk Interventions No flowsheet data found.

## 2021-06-13 ENCOUNTER — Other Ambulatory Visit (HOSPITAL_COMMUNITY): Payer: Self-pay

## 2021-07-30 ENCOUNTER — Ambulatory Visit: Payer: 59 | Admitting: Cardiovascular Disease

## 2021-07-30 NOTE — Progress Notes (Signed)
Cardiology Office Note ? ?Date:  07/31/2021  ? ?ID:  Breanna Moore, DOB 12-22-1975, MRN [id number] ? ?PCP:  Virgilio Belling, PA-C  ? ?Chief Complaint  ?Patient presents with  ? New Patient (Initial Visit)  ?  Self referral to establish care for CHF. Patient c/o twinges in chest at times. Medications reviewed by the patient verbally.   ? ? ?HPI:  ?Breanna Moore is a 46 year old woman with past medical history of ?Morbid obesity ?Schizoaffective disorder, prior noncompliance with psychiatric medications ?Obesity hypoventilation syndrome ?Restrictive lung disease, sleep apnea, "I need a bipap" ?Smoker 1/2 ppd ?Seen in the emergency room February 2023 chest pain, abdominal pain, had stopped her psychiatric medications, not using her BiPAP ? ?Per the notes in February 2023 ?in the ER she exhibited highly erratic behavior, she took her clothes off and was pacing in the ER, she was aggressive to the staff, she subsequently received Geodon and then developed hypercapnic respiratory failure. ? ?During the hospital course was treated with Lasix, BiPAP ?Encephalopathy improved ? ?Echocardiogram May 2021 ?Ejection fraction 65 to 70%, severe concentric LVH ?RV moderately dilated, RV strain noted, mildly reduced RV function ? ?Out of refills of medications: abilify and depakote ?Reports that secondary to insurance reasons she is switching primary care ?Last seen by her regular primary care 6 weeks ago, reports she has no refills ? ?Neurology says she needs bipap, had a machine in the past  ?Son dropped bipap machine, it broke ?Prior sleep study 2-3 years ago ? ?Congestion "from smoking" ? ?Walking outside, surprised herself that she is able to work further than she thought she could ? ?EKG personally reviewed by myself on todays visit ?Nsr rate 83 bpm, no ST or T wave changes ? ? ?PMH:   has a past medical history of BMI 60.0-69.9, adult (HCC), Depression, Hypertension, Morbid obesity (HCC), Non-toxic multinodular goiter  (08/29/2019), and Sleep apnea. ? ?PSH:    ?Past Surgical History:  ?Procedure Laterality Date  ? CESAREAN SECTION    ? ? ?Current Outpatient Medications  ?Medication Sig Dispense Refill  ? ARIPiprazole (ABILIFY) 10 MG tablet Take 1 tablet (10 mg total) by mouth daily. 30 tablet 0  ? divalproex (DEPAKOTE) 500 MG DR tablet Take 1 tablet (500 mg total) by mouth every 12 (twelve) hours. 60 tablet 0  ? ibuprofen (ADVIL) 800 MG tablet Take 800 mg by mouth every 8 (eight) hours as needed.    ? albuterol (VENTOLIN HFA) 108 (90 Base) MCG/ACT inhaler Inhale 2 puffs into the lungs every 6 (six) hours as needed for shortness of breath. 18 g 0  ? furosemide (LASIX) 40 MG tablet Take 1 tablet (40 mg total) by mouth daily. 90 tablet 3  ? hydrOXYzine (VISTARIL) 25 MG capsule Take 25 mg by mouth every 6 (six) hours as needed for anxiety. (Patient not taking: Reported on 07/31/2021)    ? lisinopril (ZESTRIL) 20 MG tablet Take 1 tablet (20 mg total) by mouth daily. 90 tablet 3  ? ?No current facility-administered medications for this visit.  ? ? ?Allergies:   Geodon [ziprasidone hcl], Hydrocodone-acetaminophen, Ketorolac, Paroxetine hcl, and Penicillins  ? ?Social History:  The patient  reports that she has been smoking cigarettes. She has a 2.50 pack-year smoking history. She has never used smokeless tobacco. She reports current alcohol use. She reports that she does not use drugs.  ? ?Family History:   family history includes Heart attack (age of onset: 48) in her father; Hyperlipidemia in her father  and mother; Hypertension in her father and mother; Schizophrenia in her mother.  ? ? ?Review of Systems: ?Review of Systems  ?Constitutional: Negative.   ?HENT: Negative.    ?Respiratory: Negative.    ?Cardiovascular: Negative.   ?Gastrointestinal: Negative.   ?Musculoskeletal: Negative.   ?Neurological: Negative.   ?Psychiatric/Behavioral: Negative.    ?All other systems reviewed and are negative. ? ? ?PHYSICAL EXAM: ?VS:  BP 124/86 (BP  Location: Right Wrist, Patient Position: Sitting, Cuff Size: Normal)   Pulse 83   Ht 5' 5.5" (1.664 m)   Wt (!) 163 kg   LMP  (LMP Unknown)   SpO2 95%   BMI 58.87 kg/m?  , BMI Body mass index is 58.87 kg/m?. ?GEN: Well nourished, well developed, in no acute distress ?HEENT: normal ?Neck: no JVD, carotid bruits, or masses ?Cardiac: RRR; no murmurs, rubs, or gallops,no edema  ?Respiratory:  clear to auscultation bilaterally, normal work of breathing ?GI: soft, nontender, nondistended, + BS ?MS: no deformity or atrophy ?Skin: warm and dry, no rash ?Neuro:  Strength and sensation are intact ?Psych: euthymic mood, full affect ? ?Recent Labs: ?05/30/2021: ALT 13; B Natriuretic Peptide 16.2; BUN 15; Creatinine, Ser 0.66; Hemoglobin 10.1; Magnesium 2.0; Platelets 339; Potassium 4.2; Sodium 140  ? ? ?Lipid Panel ?No results found for: CHOL, HDL, LDLCALC, TRIG ?  ? ?Wt Readings from Last 3 Encounters:  ?07/31/21 (!) 163 kg  ?04/19/21 124.7 kg  ?03/15/21 (!) 147.4 kg  ?  ? ? ?ASSESSMENT AND PLAN: ? ?Problem List Items Addressed This Visit   ? ? OSA (obstructive sleep apnea)  ? Relevant Orders  ? Ambulatory referral to Pulmonology  ? Obesity hypoventilation syndrome (HCC)  ? Morbid obesity (HCC)  ? LVH (left ventricular hypertrophy)  ? Relevant Medications  ? furosemide (LASIX) 40 MG tablet  ? lisinopril (ZESTRIL) 20 MG tablet  ? Other Relevant Orders  ? EKG 12-Lead  ? ?Other Visit Diagnoses   ? ? Chronic diastolic congestive heart failure (HCC)    -  Primary  ? Relevant Medications  ? furosemide (LASIX) 40 MG tablet  ? lisinopril (ZESTRIL) 20 MG tablet  ? ?  ? ?Morbid obesity ?Obesity hypoventilation ?Contributing to sleep apnea, ?Reports that she broke her BiPAP ?We will refer her to pulmonary for further consultation to get a new BiPAP machine ?We have encouraged continued exercise, careful diet management in an effort to lose weight. ? ?Diastolic CHF ?In the setting of morbid obesity, diastolic dysfunction ?Continue  current dose of Lasix ?Stable renal function done February 2023, stable potassium ? ?Essential hypertension ?Blood pressure stable, medications refilled, no changes made ? ?Schizoaffective disorder ?Reports stable on Abilify, Depakote ?Scheduled to see a new psychiatry team next week ?Reports that she is out of refills, she will contact her primary care physician last seen February 2023 ? ? Total encounter time more than 50 minutes ? Greater than 50% was spent in counseling and coordination of care with the patient ? ? ? ?Signed, ?Dossie Arbour, M.D., Ph.D. ?[facility] [place], [place] ?[phone] ?

## 2021-07-31 ENCOUNTER — Encounter: Payer: Self-pay | Admitting: Cardiovascular Disease

## 2021-07-31 ENCOUNTER — Ambulatory Visit: Payer: 59 | Admitting: Cardiovascular Disease

## 2021-07-31 VITALS — BP 124/86 | HR 83 | Ht 65.5 in | Wt 359.2 lb

## 2021-07-31 DIAGNOSIS — G4733 Obstructive sleep apnea (adult) (pediatric): Secondary | ICD-10-CM | POA: Diagnosis not present

## 2021-07-31 DIAGNOSIS — I5032 Chronic diastolic (congestive) heart failure: Secondary | ICD-10-CM | POA: Diagnosis not present

## 2021-07-31 DIAGNOSIS — I517 Cardiomegaly: Secondary | ICD-10-CM | POA: Diagnosis not present

## 2021-07-31 DIAGNOSIS — E662 Morbid (severe) obesity with alveolar hypoventilation: Secondary | ICD-10-CM

## 2021-07-31 MED ORDER — FUROSEMIDE 40 MG PO TABS: 40.0000 mg | ORAL_TABLET | Freq: Every day | ORAL | 3 refills | Status: AC

## 2021-07-31 MED ORDER — LISINOPRIL 20 MG PO TABS: 20.0000 mg | ORAL_TABLET | Freq: Every day | ORAL | 3 refills | Status: AC

## 2021-07-31 MED ORDER — FUROSEMIDE 40 MG PO TABS: 40.0000 mg | ORAL_TABLET | Freq: Every day | ORAL | 3 refills | Status: DC

## 2021-07-31 MED ORDER — ALBUTEROL SULFATE HFA 108 (90 BASE) MCG/ACT IN AERS: 2.0000 | INHALATION_SPRAY | Freq: Four times a day (QID) | RESPIRATORY_TRACT | 0 refills | Status: AC | PRN

## 2021-07-31 MED ORDER — LISINOPRIL 20 MG PO TABS: 20.0000 mg | ORAL_TABLET | Freq: Every day | ORAL | 3 refills | Status: DC

## 2021-07-31 NOTE — Patient Instructions (Addendum)
You have been referred to pulmonary. They will call you to schedule.   ? ?Medication Instructions:  ?No changes ? ?If you need a refill on your cardiac medications before your next appointment, please call your pharmacy.  ? ?Lab work: ?No new labs needed ? ?Testing/Procedures: ?No new testing needed ? ?Follow-Up: ?At [facility], you and your health needs are our priority.  As part of our continuing mission to provide you with exceptional heart care, we have created designated Provider Care Teams.  These Care Teams include your primary Cardiologist (physician) and Advanced Practice Providers (APPs -  Physician Assistants and Nurse Practitioners) who all work together to provide you with the care you need, when you need it. ? ?You will need a follow up appointment in 12 months ? ?Providers on your designated Care Team:   ?Nicolasa Ducking, NP ?Eula Listen, PA-C ?Cadence Fransico Michael, PA-C ? ?COVID-19 Vaccine Information can be found at: [email or web address] For questions related to vaccine distribution or appointments, please email [email or web address] or call [phone].  ? ?

## 2021-08-16 ENCOUNTER — Institutional Professional Consult (permissible substitution): Payer: 59 | Admitting: Adult Health

## 2021-08-28 ENCOUNTER — Encounter: Payer: Self-pay | Admitting: Acute Care

## 2021-08-28 ENCOUNTER — Ambulatory Visit (INDEPENDENT_AMBULATORY_CARE_PROVIDER_SITE_OTHER): Payer: 59 | Admitting: Acute Care

## 2021-08-28 VITALS — BP 136/110 | HR 88 | Temp 98.3°F | Ht 65.0 in | Wt 353.8 lb

## 2021-08-28 DIAGNOSIS — F17210 Nicotine dependence, cigarettes, uncomplicated: Secondary | ICD-10-CM

## 2021-08-28 DIAGNOSIS — G4733 Obstructive sleep apnea (adult) (pediatric): Secondary | ICD-10-CM | POA: Diagnosis not present

## 2021-08-28 DIAGNOSIS — E662 Morbid (severe) obesity with alveolar hypoventilation: Secondary | ICD-10-CM

## 2021-08-28 NOTE — Patient Instructions (Addendum)
It is good to see you today. ?We will place an order for a split night study for BiPAP therapy since it has been almost 2 years since you have had a sleep study. ?Once we get the results of the study back, we will place an order for the BiPAP machine ?Previous settings were BiPAP 19/15 cm ?Start on BiPAP at bedtime.  ?Goal is to wear for at least 6 hours each night for maximal clinical benefit. ?Continue to work on weight loss, as the link between excess weight  and sleep apnea is well established.   ?Remember to establish a good bedtime routine, and work on sleep hygiene.  ?Limit daytime naps , avoid stimulants such as caffeine and nicotine close to bedtime, exercise daily to promote sleep quality, avoid heavy , spicy, fried , or rich foods before bed. Ensure adequate exposure to natural light during the day,establish a relaxing bedtime routine with a pleasant sleep environment ( Bedroom between 60 and 67 degrees, turn off bright lights , TV or device screens  , consider black out curtains or white noise machines) ?Do not drive if sleepy. ?Remember to clean mask, tubing, filter, and reservoir once weekly with soapy water.  ?Follow up with Nola Botkins NP In 6 weeks with Video Visit to review results of sleep study or before as needed.   ?We have messaged Dr. Mariah Milling to send in your Lisinopril .   ?Call PCP tomorrow if you have not heard back from Dr. Mariah Milling.  ?

## 2021-08-28 NOTE — Progress Notes (Addendum)
? ?History of Present Illness ?Breanna Moore is a 46 y.o. female current every day smoker ( 1/2 PPD) with past medical history of Diastolic heart failure, Essential hypertension, Morbid obesity , Schizoaffective disorder, prior noncompliance with psychiatric medications , Obesity hypoventilation syndrome,  Restrictive lung disease, and sleep apnea.She was referred by Dr. Mariah Milling , Cardiology to be evaluated for OHS/OSA and need for BiPAP Therapy . She had a BiPAP machine in the past, but it has been broken. Last sleep evaluation was 2 years ago. Dr. Mariah Milling referred to get her re-established with her BiPAP therapy.  ? ? ?08/28/2021 ?Pt. Presents for evaluation for sleep disorder. She was seen in the ED in February 2023 with chest pain, abdominal pain, and she had stopped her psychiatric medications, and was not using her BiPAP. While in the ED she exhibited highly erratic behavior, she took her clothes off and was pacing in the ER, she was aggressive to the staff, she subsequently received Geodon for agitation and then developed hypercapnic respiratory failure.She was treated with Lasix and BiPAP in the ED where she improved. Neurology recommends  bipap, She had a machine in the past which her son dropped and it is broken. Last sleep study was 2 years ago.  ?She is very pleasant and appropriate today. She is yawning and is obviously very tired.  ?She endorses day time sleepiness. She awakens frequently through the night. She does have headaches in the evening, but does not wake up with a headache. She has a hard time falling asleep. She does snore. She has had witnessed apnea by family members.  ?She was previously followed by Dr. Bettina Gavia, MD through [facility] for her OSA. AHI and previous BiPAP settings noted below.  ? ? .  ? ?Test Results: ?Sleep questionnaire 08/28/2021 ?Symptoms-  Patient has witnessed sleep apnea, daytime sleepiness,dozes off during the day,  intermittent headaches, snoring , and  frequent night time awakenings.  ?Prior sleep study-  Yes, 11/09/19: PSG AHI = 63. Severe OSA, BiPAP 19/15 cm H2O ?08/24/19: HST AHI = 40, hypoxia all night less than 88% ?Severe sleep apnea, using BiPAP 19/15 cm, doing well with it. ?Bedtime- 9-10 pm ?Time to fall asleep- 45 minutes to an hour ?Nocturnal awakenings- 4 or 5 times a night ?Out of bed/start of day- 8 am ?Weight changes- Lost 20 pounds last year, but mainly fluid weight as she was started on Lasix ?Do you operate heavy machinery- NO ?Do you currently wear CPAP- No, but has worn BiPAP in the past ?Do you current wear oxygen- NO ?[place]-  15 ? ?Sitting and reading >> 2 ?Watching TV>> 3 ?Sitting inactive in a public place>> 3 ?As a passenger in a car for an hour without a break>> 2 ?Lying down to rest in the afternoon when able>> 2 ?Sitting and talking with someone>> 1 ?Sitting quietly after lunch without alcohol>> 2 ?In a car, stopped for a few minutes in traffic>> 0 ? ? ? ?11/09/19: PSG AHI = 63. Severe OSA, BiPAP 19/15 cm H2O ?08/24/19: HST AHI = 40, hypoxia all night less than 88% ? ?Severe sleep apnea, using BiPAP 19/15 cm, doing well with it until her BiPAP machine broke. ? ? ?  Latest Ref Rng & Units 05/30/2021  ?  3:37 AM 05/29/2021  ?  8:25 AM 05/28/2021  ?  3:18 AM  ?CBC  ?WBC 4.0 - 10.5 K/uL 6.5   6.7   8.4    ?Hemoglobin 12.0 - 15.0 g/dL 10.1  10.3   9.9    ?Hematocrit 36.0 - 46.0 % 36.4   36.8   36.5    ?Platelets 150 - 400 K/uL 339   325   336    ? ? ? ?  Latest Ref Rng & Units 05/30/2021  ?  3:37 AM 05/29/2021  ?  8:25 AM 05/28/2021  ?  3:18 AM  ?BMP  ?Glucose 70 - 99 mg/dL 371   062   694    ?BUN 6 - 20 mg/dL 15   12   12     ?Creatinine 0.44 - 1.00 mg/dL   8.54   6.27    ?Sodium 135 - 145 mmol/L 140   137   138    ?Potassium 3.5 - 5.1 mmol/L 4.2   4.2   4.0    ?Chloride 98 - 111 mmol/L 98   96   98    ?CO2 22 - 32 mmol/L 36   33   34    ?Calcium 8.9 - 10.3 mg/dL 9.0   8.8   8.7    ? ? ?BNP ?   ?Component Value Date/Time  ? BNP 16.2 05/30/2021  0337  ? ? ?ProBNP ?No results found for: PROBNP ? ?PFT ?No results found for: FEV1PRE, FEV1POST, FVCPRE, FVCPOST, TLC, DLCOUNC, PREFEV1FVCRT, PSTFEV1FVCRT ? ?No results found. ? ? ?Past medical hx ?Past Medical History:  ?Diagnosis Date  ? BMI 60.0-69.9, adult (HCC)   ? Depression   ? Hypertension   ? Morbid obesity (HCC)   ? Non-toxic multinodular goiter 08/29/2019  ? CTA-Chest 08/28/19: Asymmetric enlargement thyroid lobes, L>R, extending retrosternal with slight mass effect upon the trachea and mild LEFT-to-RIGHT midline shift.  ? Sleep apnea   ?  ? ?Social History  ? ?Tobacco Use  ? Smoking status: Every Day  ?  Packs/day: 0.50  ?  Years: 5.00  ?  Pack years: 2.50  ?  Types: Cigarettes  ? Smokeless tobacco: Never  ?Vaping Use  ? Vaping Use: Never used  ?Substance Use Topics  ? Alcohol use: Yes  ?  Alcohol/week: 0.0 standard drinks  ?  Comment: occasional  ? Drug use: No  ? ? ?Ms.Barren reports that she has been smoking cigarettes. She has a 2.50 pack-year smoking history. She has never used smokeless tobacco. She reports current alcohol use. She reports that she does not use drugs. ? ?Tobacco Cessation: ?Smoker 1/2 PPD x 5 yearts ? ?Past surgical hx, Family hx, Social hx all reviewed. ? ?Current Outpatient Medications on File Prior to Visit  ?Medication Sig  ? albuterol (VENTOLIN HFA) 108 (90 Base) MCG/ACT inhaler Inhale 2 puffs into the lungs every 6 (six) hours as needed for shortness of breath.  ? ARIPiprazole (ABILIFY) 10 MG tablet Take 1 tablet (10 mg total) by mouth daily.  ? divalproex (DEPAKOTE) 500 MG DR tablet Take 1 tablet (500 mg total) by mouth every 12 (twelve) hours.  ? furosemide (LASIX) 40 MG tablet Take 1 tablet (40 mg total) by mouth daily.  ? ibuprofen (ADVIL) 800 MG tablet Take 800 mg by mouth every 8 (eight) hours as needed.  ? lisinopril (ZESTRIL) 20 MG tablet Take 1 tablet (20 mg total) by mouth daily.  ? hydrOXYzine (VISTARIL) 25 MG capsule Take 25 mg by mouth every 6 (six) hours as needed  for anxiety. (Patient not taking: Reported on 07/31/2021)  ? ?No current facility-administered medications on file prior to visit.  ?  ? ?Allergies  ?Allergen Reactions  ?  Geodon [Ziprasidone Hcl] Other (See Comments)  ?  Patient became hypoxic and required nonrebreather. Please avoid or try low dose   ? Hydrocodone-Acetaminophen   ? Ketorolac   ? Paroxetine Hcl   ?  Other reaction(s): Confusion  ? Penicillins Rash  ?  Has patient had a PCN reaction causing immediate rash, facial/tongue/throat swelling, SOB or lightheadedness with hypotension: Yes ?Has patient had a PCN reaction causing severe rash involving mucus membranes or skin necrosis: No ?Has patient had a PCN reaction that required hospitalization: No ?Has patient had a PCN reaction occurring within the last 10 years: No ?If all of the above answers are "NO", then may proceed with Cephalosporin use. ?  ? ? ?Review Of Systems: ? ?Constitutional:   +  weight loss, No night sweats,  Fevers, chills, + fatigue, or  lassitude. ? ?HEENT:   + headaches,  Difficulty swallowing,  Tooth/dental problems, or  Sore throat,  ?              No sneezing, itching, ear ache, nasal congestion, post nasal drip,  ? ?CV:  No chest pain,  Orthopnea, PND, swelling in lower extremities, anasarca, dizziness, palpitations, syncope.  ? ?GI  No heartburn, indigestion, abdominal pain, nausea, vomiting, diarrhea, change in bowel habits, loss of appetite, bloody stools.  ? ?Resp: No shortness of breath with exertion or at rest.  No excess mucus, no productive cough,  No non-productive cough,  No coughing up of blood.  No change in color of mucus.  No wheezing.  No chest wall deformity ? ?Skin: no rash or lesions. ? ?GU: no dysuria, change in color of urine, no urgency or frequency.  No flank pain, no hematuria  ? ?MS:  No joint pain or swelling.  No decreased range of motion.  No back pain. ? ?Psych:  No change in mood or affect. No depression or anxiety.  No memory loss. ? ? ?Vital Signs ?BP  (!) 136/110 (BP Location: Left Arm, Patient Position: Sitting, Cuff Size: Large)   Pulse 88   Temp 98.3 ?F (36.8 ?C) (Oral)   Ht  (1.651 m)   Wt (!) 353 lb 12.8 oz (160.5 kg)   LMP  (LMP Unknown)

## 2021-08-29 ENCOUNTER — Telehealth: Payer: Self-pay | Admitting: Emergency Medicine

## 2021-08-29 NOTE — Telephone Encounter (Signed)
Called patient. No answer. Lmtcb.  

## 2021-08-29 NOTE — Progress Notes (Signed)
Reviewed and agree with assessment/plan. ? ? ?Simren Popson, MD ?[facility] ?08/29/2021, 9:08 AM ?Pager:  [phone] ? ?

## 2021-08-29 NOTE — Telephone Encounter (Signed)
-----  Message from Minna Merritts, MD sent at 08/28/2021  5:27 PM EDT ----- ?Can we find out what dose of lisinopril she is taking ?We met her once, typically primary care will do the refills ?If she doubled it, we did not make the change I do not think ?Perhaps primary care or somebody else doubled it ?They would typically follow-up with routine lab work ?Thx ?TGollan ? ?----- Message ----- ?From: Magdalen Spatz, NP ?Sent: 08/28/2021   4:28 PM EDT ?To: Minna Merritts, MD ? ?Dr. Rockey Situ. I am seeing Breanna Moore for her OSA today. She is almost out of her lisinopril, and she was wondering if you can call in a prescription for refill. She said her dose was doubled, and she ran out more quickly than she thought. She still has 2 days worth of medication. ?Thanks so much ? ? ?

## 2023-03-11 ENCOUNTER — Other Ambulatory Visit: Payer: Self-pay | Admitting: Cardiovascular Disease
